# Patient Record
Sex: Female | Born: 1985 | Race: White | Hispanic: No | Smoking: Current every day smoker
Health system: Southern US, Community
[De-identification: ages and names within clinical notes are randomized; demographics above are authoritative.]

## PROBLEM LIST (undated history)

## (undated) DIAGNOSIS — F329 Major depressive disorder, single episode, unspecified: Secondary | ICD-10-CM

## (undated) DIAGNOSIS — F419 Anxiety disorder, unspecified: Secondary | ICD-10-CM

## (undated) DIAGNOSIS — Z87442 Personal history of urinary calculi: Secondary | ICD-10-CM

## (undated) DIAGNOSIS — R87629 Unspecified abnormal cytological findings in specimens from vagina: Secondary | ICD-10-CM

## (undated) DIAGNOSIS — E109 Type 1 diabetes mellitus without complications: Secondary | ICD-10-CM

## (undated) DIAGNOSIS — F32A Depression, unspecified: Secondary | ICD-10-CM

## (undated) DIAGNOSIS — R51 Headache: Secondary | ICD-10-CM

## (undated) HISTORY — PX: CERVICAL BIOPSY  W/ LOOP ELECTRODE EXCISION: SUR135

## (undated) HISTORY — PX: INDUCED ABORTION: SHX677

## (undated) HISTORY — DX: Type 1 diabetes mellitus without complications: E10.9

## (undated) HISTORY — PX: OTHER SURGICAL HISTORY: SHX169

---
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
## 2006-09-26 ENCOUNTER — Ambulatory Visit (HOSPITAL_COMMUNITY): Admission: RE | Admit: 2006-09-26 | Discharge: 2006-09-26 | Payer: Self-pay | Admitting: Obstetrics and Gynecology

## 2006-10-24 ENCOUNTER — Ambulatory Visit (HOSPITAL_COMMUNITY): Admission: RE | Admit: 2006-10-24 | Discharge: 2006-10-24 | Payer: Self-pay | Admitting: Obstetrics and Gynecology

## 2006-11-19 ENCOUNTER — Ambulatory Visit (HOSPITAL_COMMUNITY): Admission: RE | Admit: 2006-11-19 | Discharge: 2006-11-19 | Payer: Self-pay | Admitting: Obstetrics and Gynecology

## 2006-12-17 ENCOUNTER — Ambulatory Visit (HOSPITAL_COMMUNITY): Admission: RE | Admit: 2006-12-17 | Discharge: 2006-12-17 | Payer: Self-pay | Admitting: Obstetrics and Gynecology

## 2007-01-14 ENCOUNTER — Ambulatory Visit (HOSPITAL_COMMUNITY): Admission: RE | Admit: 2007-01-14 | Discharge: 2007-01-14 | Payer: Self-pay | Admitting: Obstetrics and Gynecology

## 2007-02-04 ENCOUNTER — Ambulatory Visit (HOSPITAL_COMMUNITY): Admission: RE | Admit: 2007-02-04 | Discharge: 2007-02-04 | Payer: Self-pay | Admitting: Obstetrics and Gynecology

## 2007-07-16 ENCOUNTER — Emergency Department (HOSPITAL_COMMUNITY): Admission: EM | Admit: 2007-07-16 | Discharge: 2007-07-16 | Payer: Self-pay | Admitting: Emergency Medicine

## 2007-09-27 IMAGING — US US OB FOLLOW-UP
1 series · 14 of 28 positions shown · non-contrast
Comparison: none

OBSTETRICAL ULTRASOUND:
 This ultrasound was performed in The [HOSPITAL], and the [facility] report will be stored to [REDACTED] PACS.

[Series 1: us ob follow-up · 14 of 39 slices shown]
[im 2/39]
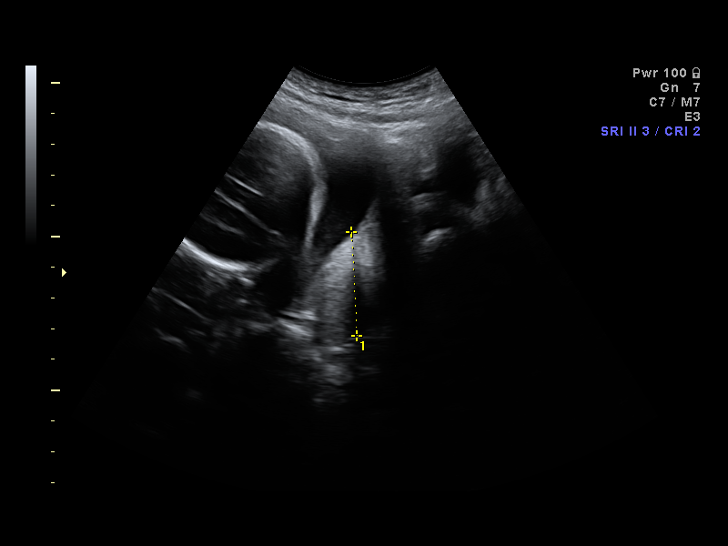
[im 5/39]
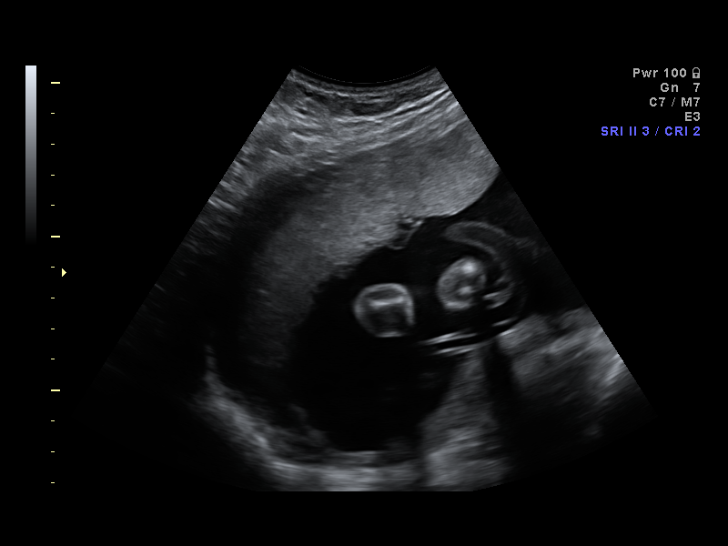
[im 8/39]
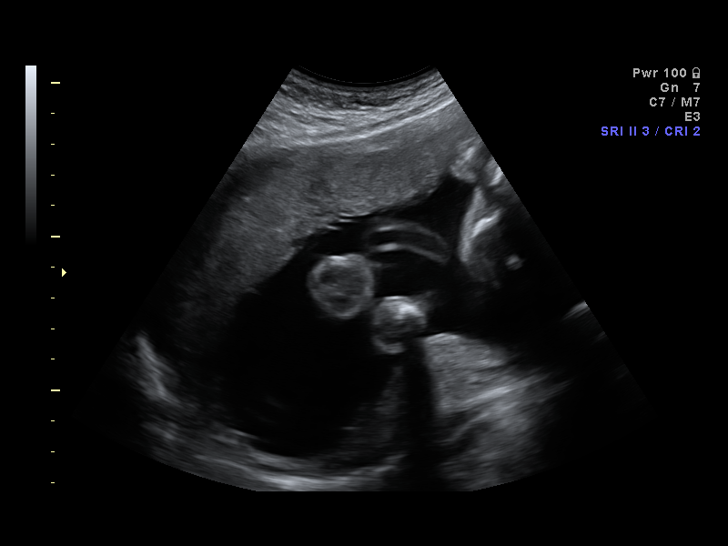
[im 10/39]
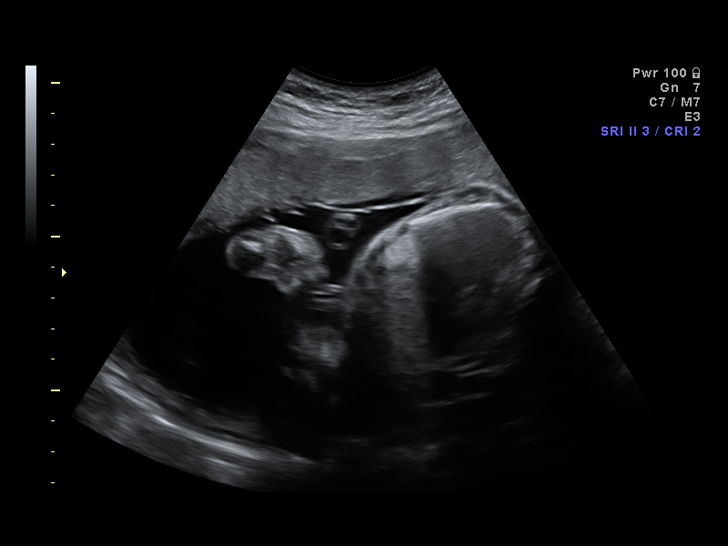
[im 13/39]
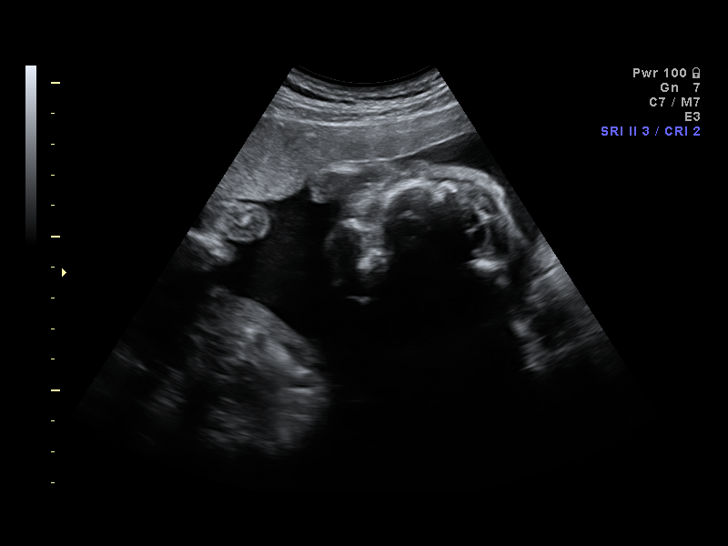
[im 16/39]
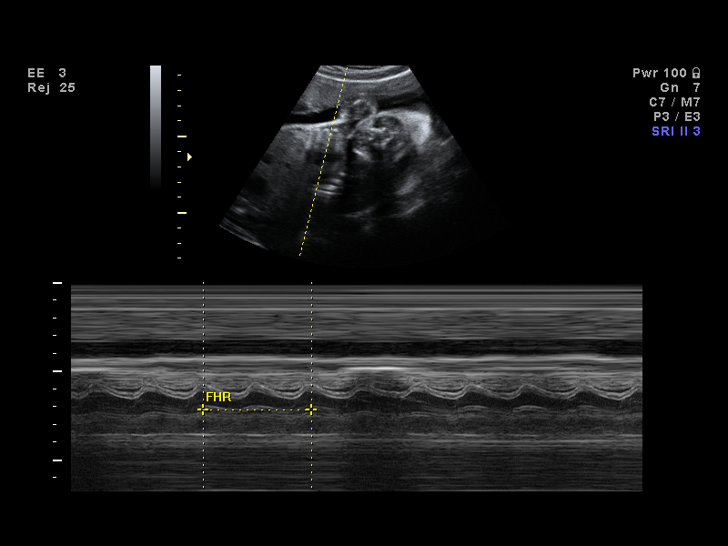
[im 19/39]
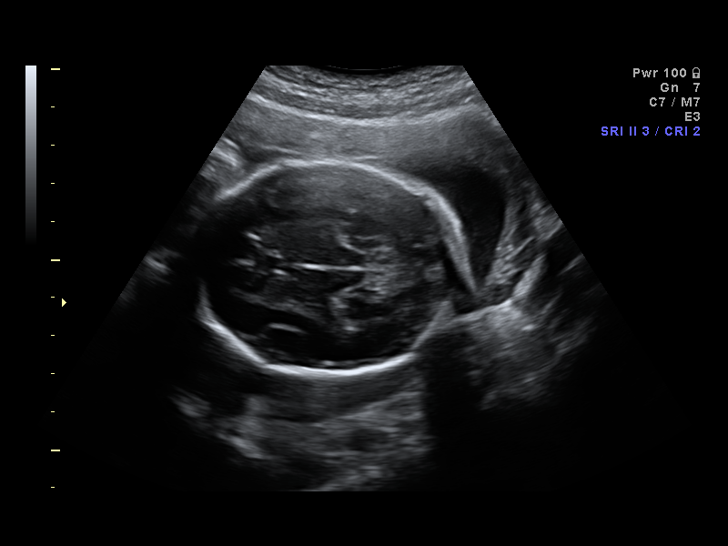
[im 22/39]
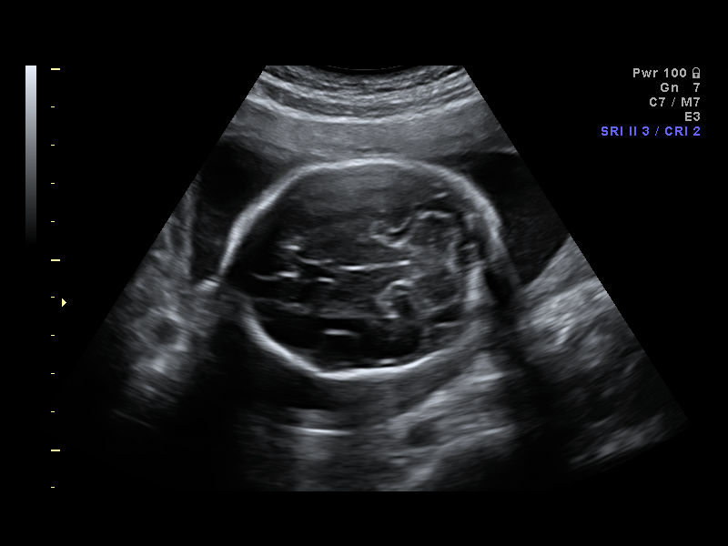
[im 24/39]
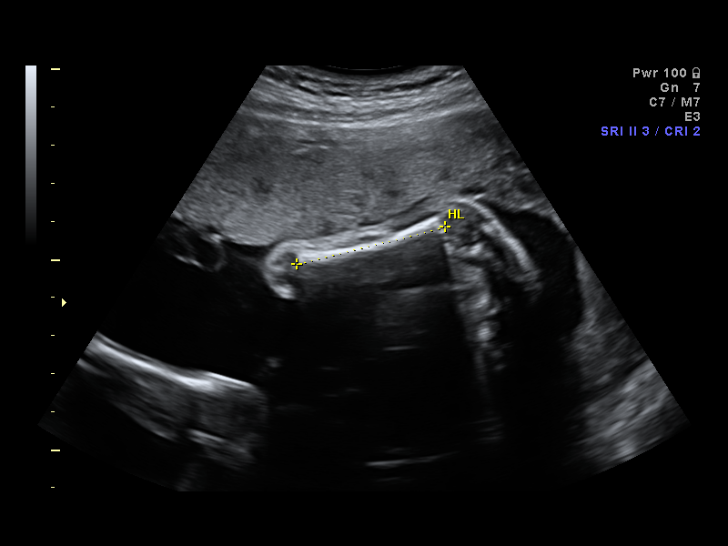
[im 27/39]
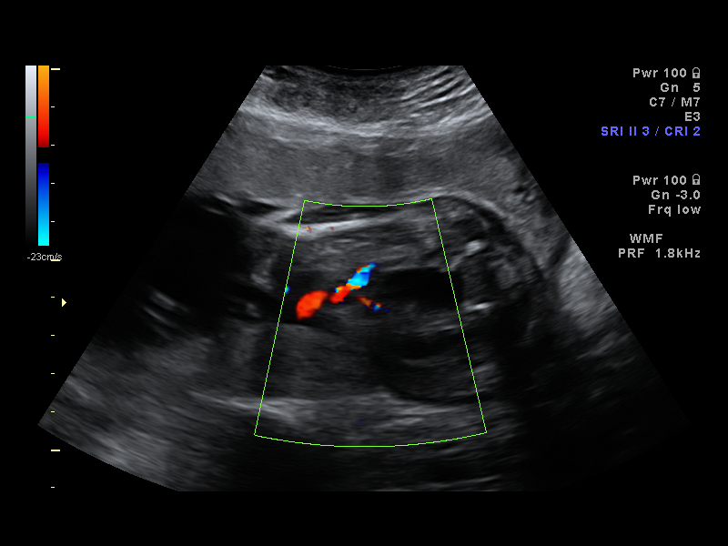
[im 30/39]
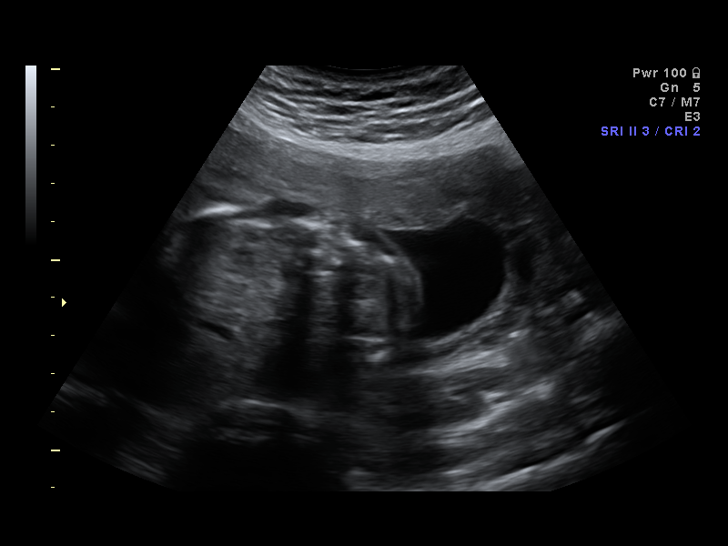
[im 33/39]
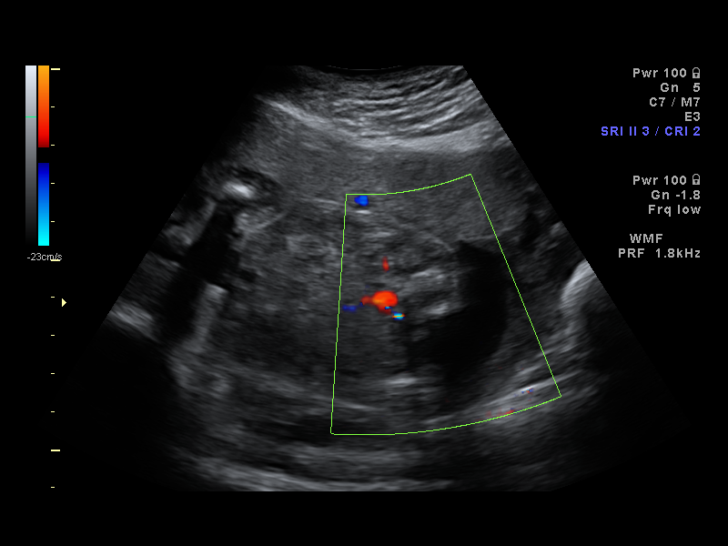
[im 36/39]
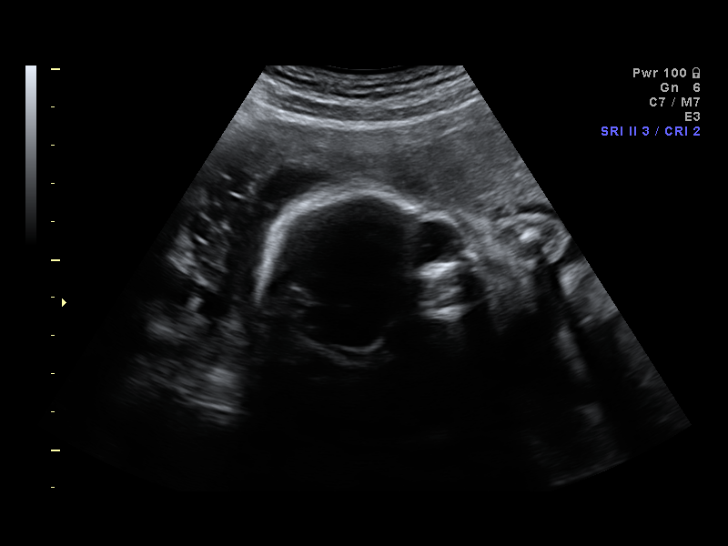
[im 39/39]
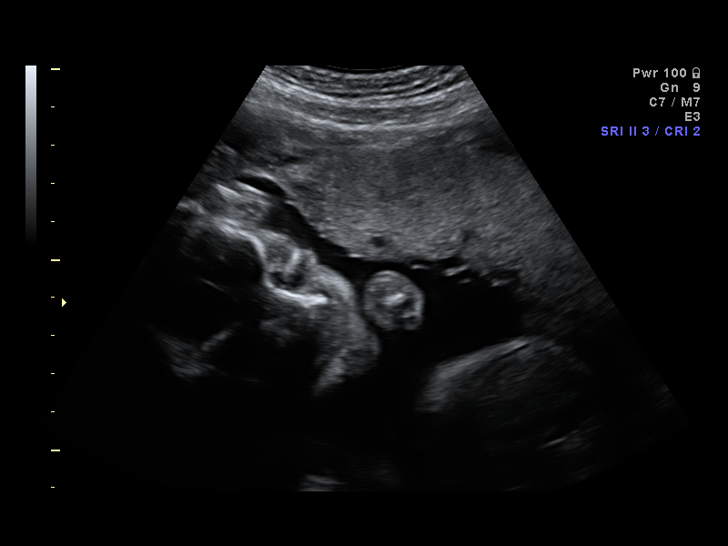

[14 of 28 positions shown; findings below may reference images not displayed]

IMPRESSION: The [facility] report has also been faxed to the ordering physician.

## 2007-12-13 IMAGING — US US OB FOLLOW-UP
1 series · 14 of 16 positions shown · non-contrast
Comparison: none

OBSTETRICAL ULTRASOUND:
 This ultrasound was performed in The [HOSPITAL], and the [facility] report will be stored to [REDACTED] PACS.

[Series 1: us ob follow-up · 16 acquisitions, 14 frames shown]
[im 1/16]
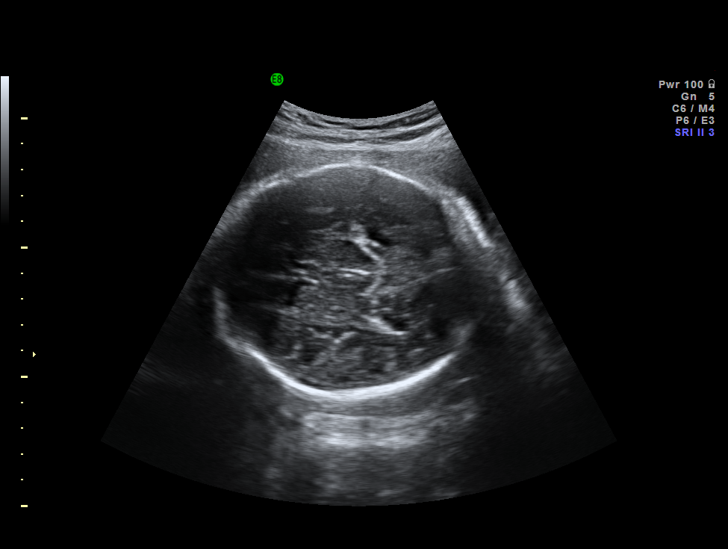
[im 2/16]
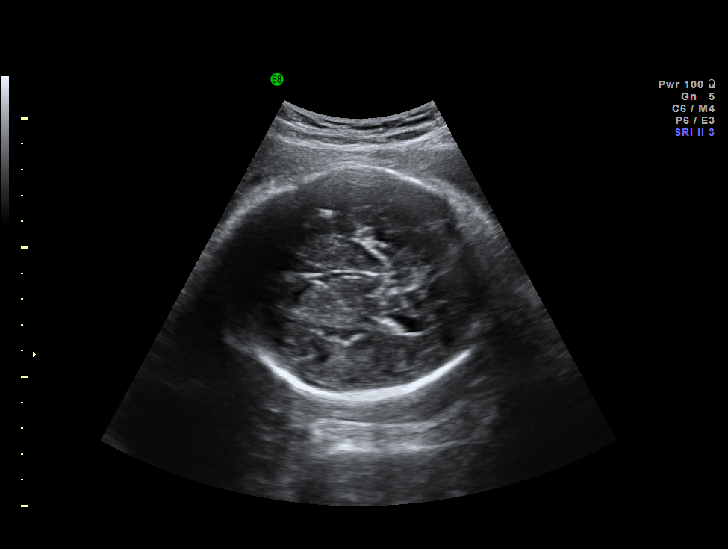
[im 3/16]
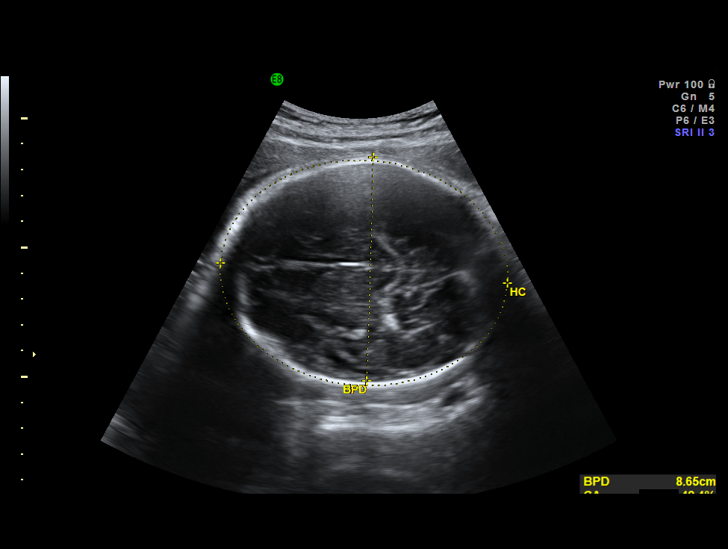
[im 5/16]
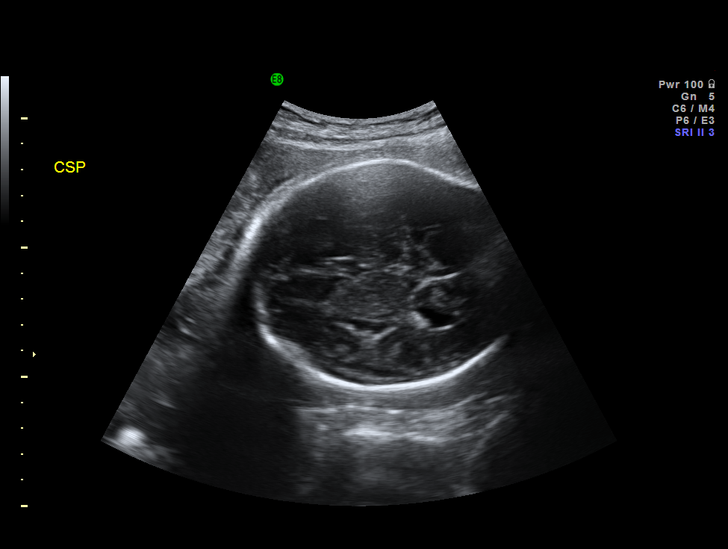
[im 6/16]
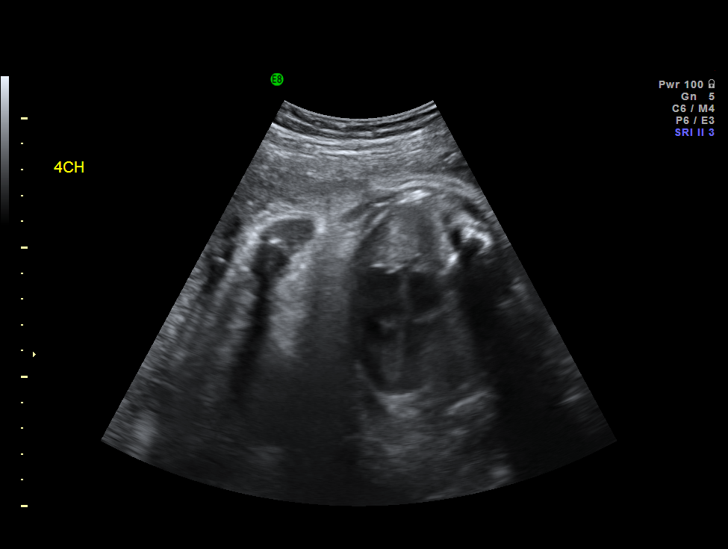
[im 7/16]
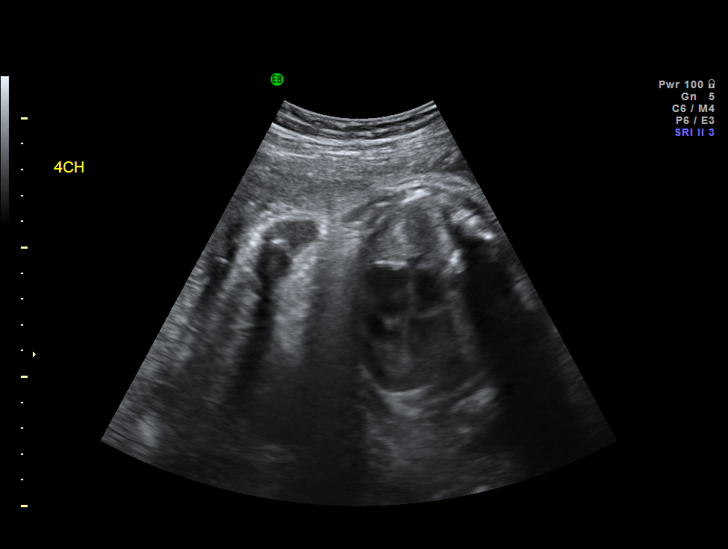
[im 8/16]
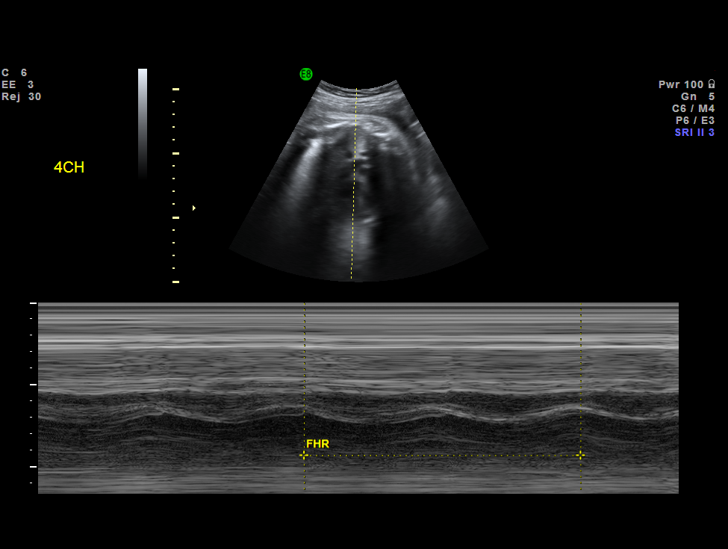
[im 9/16]
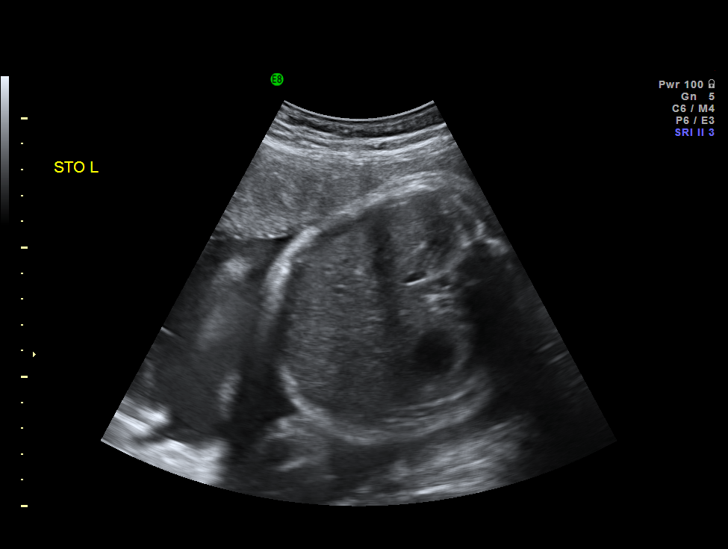
[im 10/16]
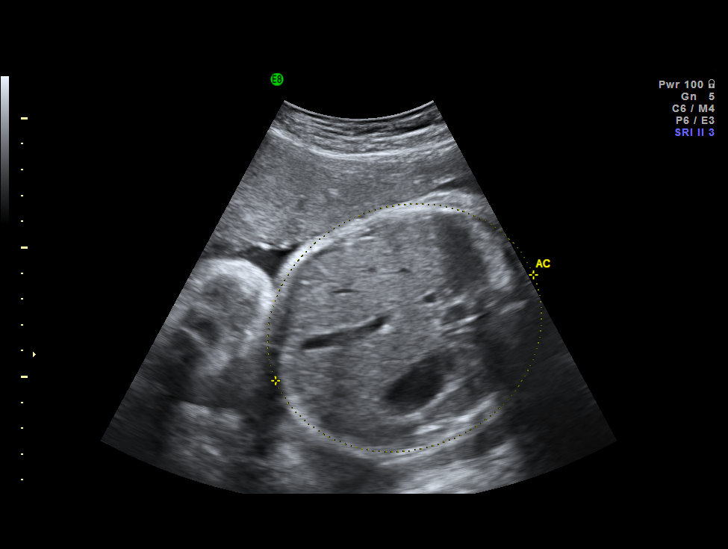
[im 11/16]
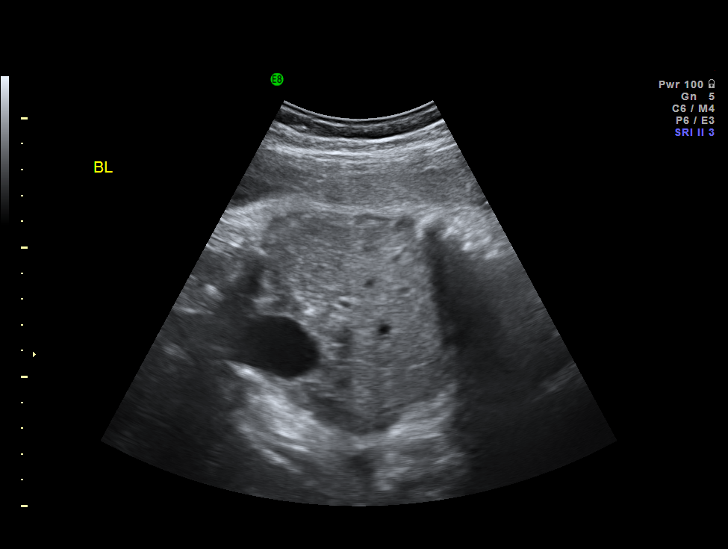
[im 13/16]
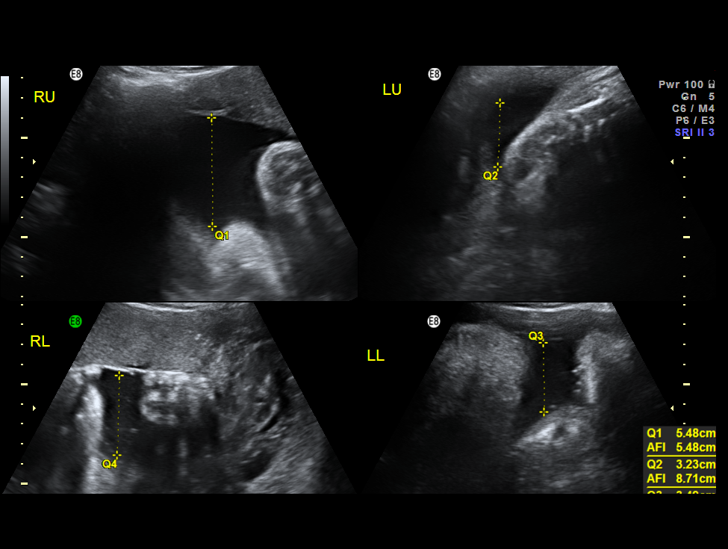
[im 14/16]
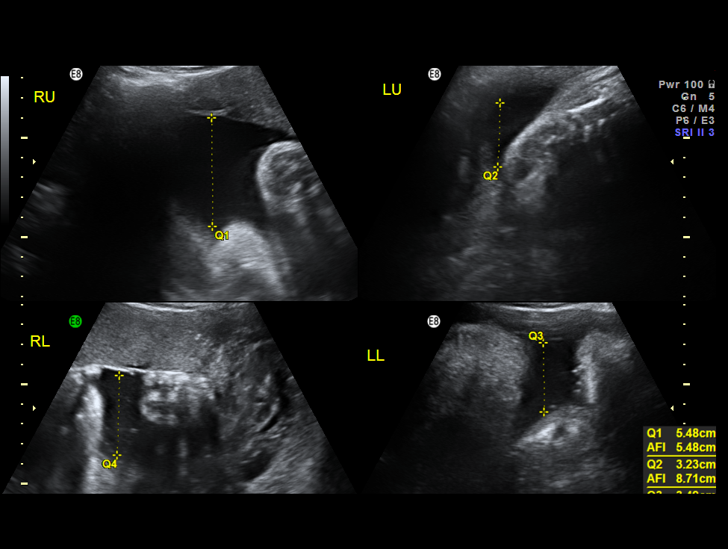
[im 15/16]
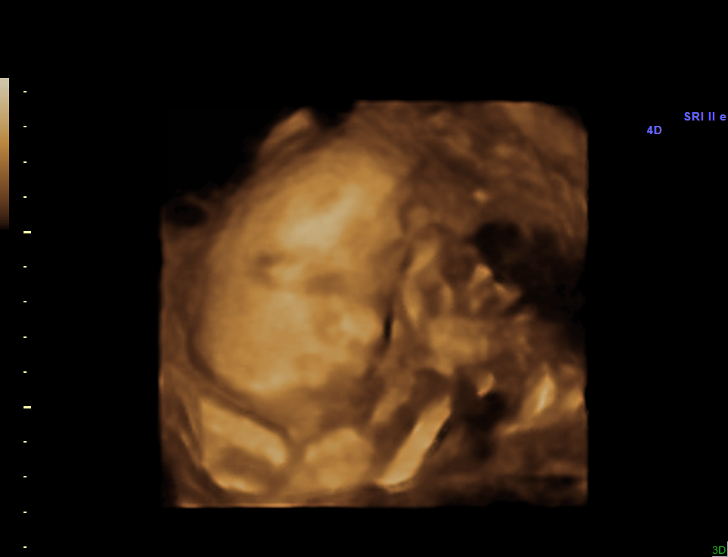
[im 16/16]
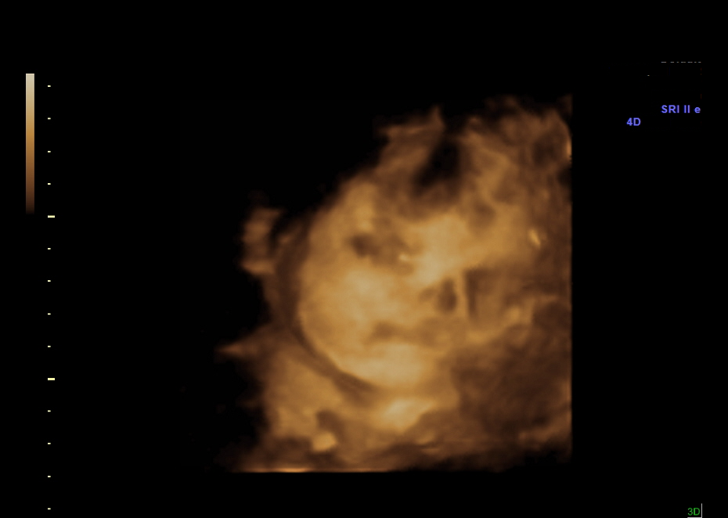

[14 of 16 positions shown; findings below may reference images not displayed]

IMPRESSION: The [facility] report has also been faxed to the ordering physician.

## 2008-08-17 ENCOUNTER — Emergency Department (HOSPITAL_COMMUNITY): Admission: EM | Admit: 2008-08-17 | Discharge: 2008-08-18 | Payer: Self-pay | Admitting: Emergency Medicine

## 2008-10-04 ENCOUNTER — Inpatient Hospital Stay (HOSPITAL_COMMUNITY): Admission: EM | Admit: 2008-10-04 | Discharge: 2008-10-06 | Payer: Self-pay | Admitting: Emergency Medicine

## 2010-01-09 ENCOUNTER — Emergency Department (HOSPITAL_COMMUNITY): Admission: EM | Admit: 2010-01-09 | Discharge: 2010-01-09 | Payer: Self-pay | Admitting: Emergency Medicine

## 2010-08-27 ENCOUNTER — Encounter: Payer: Self-pay | Admitting: Obstetrics and Gynecology

## 2010-10-23 LAB — POCT I-STAT, CHEM 8
Calcium, Ion: 1.18 mmol/L (ref 1.12–1.32)
Glucose, Bld: 235 mg/dL — ABNORMAL HIGH (ref 70–99)
HCT: 42 % (ref 36.0–46.0)
Hemoglobin: 14.3 g/dL (ref 12.0–15.0)
Sodium: 138 mEq/L (ref 135–145)
TCO2: 23 mmol/L (ref 0–100)

## 2010-10-23 LAB — CBC
Hemoglobin: 14.2 g/dL (ref 12.0–15.0)
WBC: 7.2 10*3/uL (ref 4.0–10.5)

## 2010-10-23 LAB — URINE MICROSCOPIC-ADD ON

## 2010-10-23 LAB — DIFFERENTIAL
Basophils Absolute: 0 10*3/uL (ref 0.0–0.1)
Basophils Relative: 0 % (ref 0–1)
Eosinophils Absolute: 0.1 10*3/uL (ref 0.0–0.7)
Eosinophils Relative: 1 % (ref 0–5)
Monocytes Absolute: 0.4 10*3/uL (ref 0.1–1.0)
Monocytes Relative: 5 % (ref 3–12)
Neutro Abs: 4.6 10*3/uL (ref 1.7–7.7)
Neutrophils Relative %: 64 % (ref 43–77)

## 2010-10-23 LAB — URINALYSIS, ROUTINE W REFLEX MICROSCOPIC
Hgb urine dipstick: NEGATIVE
Ketones, ur: NEGATIVE mg/dL
Leukocytes, UA: NEGATIVE
Nitrite: NEGATIVE
Protein, ur: NEGATIVE mg/dL
Specific Gravity, Urine: 1.029 (ref 1.005–1.030)

## 2010-11-10 ENCOUNTER — Emergency Department (HOSPITAL_COMMUNITY)
Admission: EM | Admit: 2010-11-10 | Discharge: 2010-11-10 | Disposition: A | Discharge status: Home / Self Care | Payer: BC Managed Care – HMO | Attending: Emergency Medicine | Admitting: Emergency Medicine

## 2010-11-10 DIAGNOSIS — R5381 Other malaise: Secondary | ICD-10-CM | POA: Insufficient documentation

## 2010-11-10 DIAGNOSIS — R5383 Other fatigue: Secondary | ICD-10-CM | POA: Insufficient documentation

## 2010-11-10 DIAGNOSIS — R631 Polydipsia: Secondary | ICD-10-CM | POA: Insufficient documentation

## 2010-11-10 DIAGNOSIS — E119 Type 2 diabetes mellitus without complications: Secondary | ICD-10-CM | POA: Insufficient documentation

## 2010-11-10 LAB — POCT I-STAT, CHEM 8
BUN: 14 mg/dL (ref 6–23)
Calcium, Ion: 1.08 mmol/L — ABNORMAL LOW (ref 1.12–1.32)
Chloride: 105 mEq/L (ref 96–112)
Creatinine, Ser: 0.5 mg/dL (ref 0.4–1.2)
HCT: 39 % (ref 36.0–46.0)
Hemoglobin: 13.3 g/dL (ref 12.0–15.0)
Sodium: 138 mEq/L (ref 135–145)

## 2010-11-10 LAB — URINALYSIS, ROUTINE W REFLEX MICROSCOPIC
Glucose, UA: >1000 mg/dL — AB
Hgb urine dipstick: NEGATIVE
Protein, ur: NEGATIVE mg/dL

## 2010-11-10 LAB — GLUCOSE, CAPILLARY

## 2010-11-10 LAB — URINE MICROSCOPIC-ADD ON

## 2010-11-16 LAB — GLUCOSE, CAPILLARY
Glucose-Capillary: 151 mg/dL — ABNORMAL HIGH (ref 70–99)
Glucose-Capillary: 169 mg/dL — ABNORMAL HIGH (ref 70–99)
Glucose-Capillary: 174 mg/dL — ABNORMAL HIGH (ref 70–99)
Glucose-Capillary: 223 mg/dL — ABNORMAL HIGH (ref 70–99)
Glucose-Capillary: 225 mg/dL — ABNORMAL HIGH (ref 70–99)
Glucose-Capillary: 229 mg/dL — ABNORMAL HIGH (ref 70–99)
Glucose-Capillary: 241 mg/dL — ABNORMAL HIGH (ref 70–99)
Glucose-Capillary: 259 mg/dL — ABNORMAL HIGH (ref 70–99)
Glucose-Capillary: 267 mg/dL — ABNORMAL HIGH (ref 70–99)

## 2010-11-16 LAB — BASIC METABOLIC PANEL
BUN: 4 mg/dL — ABNORMAL LOW (ref 6–23)
CO2: 25 mEq/L (ref 19–32)
Calcium: 7.9 mg/dL — ABNORMAL LOW (ref 8.4–10.5)
Chloride: 111 mEq/L (ref 96–112)
Creatinine, Ser: 0.49 mg/dL (ref 0.4–1.2)
GFR calc Af Amer: >60 mL/min (ref >60)
GFR calc non Af Amer: >60 mL/min (ref >60)
Glucose, Bld: 245 mg/dL — ABNORMAL HIGH (ref 70–99)
Potassium: 3.8 mEq/L (ref 3.5–5.1)
Sodium: 139 mEq/L (ref 135–145)

## 2010-11-16 LAB — DIFFERENTIAL
Basophils Absolute: 0 10*3/uL (ref 0.0–0.1)
Basophils Relative: 0 % (ref 0–1)
Eosinophils Absolute: 0.1 10*3/uL (ref 0.0–0.7)
Neutro Abs: 4.3 10*3/uL (ref 1.7–7.7)
Neutrophils Relative %: 63 % (ref 43–77)

## 2010-11-16 LAB — CBC
MCHC: 35.3 g/dL (ref 30.0–36.0)
MCV: 88.6 fL (ref 78.0–100.0)
Platelets: 219 10*3/uL (ref 150–400)

## 2010-11-16 LAB — HEMOGLOBIN A1C
Hgb A1c MFr Bld: 11.2 % — ABNORMAL HIGH (ref 4.6–6.1)
Mean Plasma Glucose: 275 mg/dL

## 2010-11-20 LAB — URINALYSIS, ROUTINE W REFLEX MICROSCOPIC
Nitrite: NEGATIVE
Specific Gravity, Urine: 1.017 (ref 1.005–1.030)
Urobilinogen, UA: 0.2 mg/dL (ref 0.0–1.0)

## 2010-11-20 LAB — DIFFERENTIAL
Basophils Absolute: 0.3 10*3/uL — ABNORMAL HIGH (ref 0.0–0.1)
Basophils Relative: 2 % — ABNORMAL HIGH (ref 0–1)
Eosinophils Relative: 0 % (ref 0–5)
Monocytes Absolute: 0.8 10*3/uL (ref 0.1–1.0)
Neutro Abs: 12.6 10*3/uL — ABNORMAL HIGH (ref 1.7–7.7)

## 2010-11-20 LAB — CBC
Hemoglobin: 12.5 g/dL (ref 12.0–15.0)
MCHC: 33.7 g/dL (ref 30.0–36.0)
Platelets: 294 10*3/uL (ref 150–400)
RDW: 12 % (ref 11.5–15.5)

## 2010-11-20 LAB — BASIC METABOLIC PANEL
BUN: 8 mg/dL (ref 6–23)
CO2: 21 mEq/L (ref 19–32)
Calcium: 9.3 mg/dL (ref 8.4–10.5)
Glucose, Bld: 197 mg/dL — ABNORMAL HIGH (ref 70–99)
Sodium: 138 mEq/L (ref 135–145)

## 2010-11-20 LAB — HEPATIC FUNCTION PANEL
AST: 10 U/L (ref 0–37)
Bilirubin, Direct: 0.2 mg/dL (ref 0.0–0.3)
Indirect Bilirubin: 0.9 mg/dL (ref 0.3–0.9)

## 2010-11-20 LAB — URINE MICROSCOPIC-ADD ON

## 2010-11-21 LAB — DIFFERENTIAL
Basophils Absolute: 0.1 10*3/uL (ref 0.0–0.1)
Lymphocytes Relative: 20 % (ref 12–46)
Monocytes Absolute: 0.5 10*3/uL (ref 0.1–1.0)
Monocytes Relative: 6 % (ref 3–12)
Neutro Abs: 5.6 10*3/uL (ref 1.7–7.7)

## 2010-11-21 LAB — COMPREHENSIVE METABOLIC PANEL
Albumin: 3 g/dL — ABNORMAL LOW (ref 3.5–5.2)
BUN: 7 mg/dL (ref 6–23)
Creatinine, Ser: 0.56 mg/dL (ref 0.4–1.2)
Total Bilirubin: 0.5 mg/dL (ref 0.3–1.2)
Total Protein: 6.4 g/dL (ref 6.0–8.3)

## 2010-11-21 LAB — URINE CULTURE: Colony Count: >=100000

## 2010-11-21 LAB — POCT I-STAT 3, VENOUS BLOOD GAS (G3P V)
Acid-Base Excess: 2 mmol/L (ref 0.0–2.0)
Bicarbonate: 26.7 mEq/L — ABNORMAL HIGH (ref 20.0–24.0)
pH, Ven: 7.438 — ABNORMAL HIGH (ref 7.250–7.300)
pO2, Ven: 28 mmHg — CL (ref 30.0–45.0)

## 2010-11-21 LAB — URINALYSIS, ROUTINE W REFLEX MICROSCOPIC
Hgb urine dipstick: NEGATIVE
Nitrite: NEGATIVE
Protein, ur: NEGATIVE mg/dL
Specific Gravity, Urine: 1.021 (ref 1.005–1.030)
Urobilinogen, UA: 1 mg/dL (ref 0.0–1.0)

## 2010-11-21 LAB — CBC
HCT: 32 % — ABNORMAL LOW (ref 36.0–46.0)
MCV: 88.9 fL (ref 78.0–100.0)
Platelets: 226 10*3/uL (ref 150–400)
RDW: 14.8 % (ref 11.5–15.5)

## 2010-11-21 LAB — URINE MICROSCOPIC-ADD ON

## 2010-12-19 NOTE — Discharge Summary (Signed)
Traci Ellison, Traci Ellison             ACCOUNT NO.:  [id number]   MEDICAL RECORD NO.:  [id number]          PATIENT TYPE:  INP   LOCATION:  5120                         FACILITY:  [facility]   PHYSICIAN:  Michelene Gardener, MD    DATE OF BIRTH:  [birth year]   DATE OF ADMISSION:  10/03/2008  DATE OF DISCHARGE:  10/06/2008                               DISCHARGE SUMMARY   DISCHARGE DIAGNOSES:  1. Bilateral pyelonephritis.  2. Right mid kidney abscess versus cyst.  3. Hyperglycemia secondary to type 1 diabetes mellitus.  4. Low back pain secondary to bilateral pyelonephritis right mid      kidney abscess versus cyst.  5. Hypotension that is resolved.   DISCHARGE MEDICATIONS:  1. Nitrofurantoin 100 mg p.o. once daily x7 days.  2. The patient will continue her insulin pump and her NovoLog as      prescribed by her endocrinologist.   CONSULTATIONS:  None.   PROCEDURES:  None.   RADIOLOGY STUDIES:  1. Chest x-ray on October 03, 2008, showed no acute disease.  2. CT scan of the abdomen on October 04, 2008, showed bilateral nephritis      worse in the right side and a small abscess in the right kidney.      CT scan of the pelvis without contrast showed negative pelvic CT.   FOLLOWUP:  1. Private endocrinologist in 1 week.  2. Primary physician in 1 week.   COURSE OF HOSPITALIZATION:  1. Bilateral pyelonephritis.  This patient had presented to the      hospital with symptoms and signs consistent with pyelonephritis.  A      CT scan of the abdomen and pelvis was done, and it showed findings      consistent with pyelonephritis and a small abscess.  The patient      was admitted to the hospital for antibiotics.  She was started on      IV ciprofloxacin.  Case was discussed with Interventional Radiology      who think the abscess is very small and does not need any kind of      intervention at this point.  The patient did very well during the      hospitalization.  Initially, she was started on IV  Cipro.  Urine      cultures were sent that came positive for E. coli which is      resistant to ciprofloxacin use, but sensitive to nitrofurantoin.      The patient was given a prescription for nitrofurantoin for 7 days.      She was advised to follow with primary doctor.  The patient might      need repeat CAT scan if her symptoms worsened or if she developed      high fever to follow her abscess that was explained to her and she      was advised to follow with her primary doctor in that regard.  At      the time of discharge, she is very stable, does not have fever.      Vitals has been stable.  2. Right mid kidney abscess versus cyst management was as #1.  3. Hyperglycemia secondary to diabetes mellitus.  The patient has been      noncompliant with her insulin pump because of financial problems.      She was put on Lantus and NovoLog during her current      hospitalization.  I discussed her diabetes control with her, and      she stated that she will go back to her insulin pump since the      issues with that has been resolved.  She will monitor her sugars at      home and will check with her endocrinologist this week for any      further adjustment.  4. Hypotension that has resolved with IV fluids.   Otherwise, other medical conditions remained stable in the hospital.  At  the time of the discharge, the patient was asymptomatic and was back to  her baseline.  She was given antibiotics for 7 days, and she will follow  with her primary doctor and with her private endocrinologist.   Total assessment time is 40 minutes.      Michelene Gardener, MD  Electronically Signed     NAE/MEDQ  D:  10/06/2008  T:  10/06/2008  Job:  [id number]

## 2010-12-19 NOTE — H&P (Signed)
Traci Ellison, Traci Ellison             ACCOUNT NO.:  [id number]   MEDICAL RECORD NO.:  [id number]          PATIENT TYPE:  INP   LOCATION:  5120                         FACILITY:  [facility]   PHYSICIAN:  Della Goo, M.D. DATE OF BIRTH:  11-01-1985   DATE OF ADMISSION:  10/03/2008  DATE OF DISCHARGE:                              HISTORY & PHYSICAL   PRIMARY CARE PHYSICIAN:  [facility].   CHIEF COMPLAINT:  Right-sided back pain.   HISTORY OF PRESENT ILLNESS:  This is a 25 year old female who presents  to the emergency department with complaints of severe right-sided back  pain along with nausea and vomiting for the past 4 to 5 days.  The  patient reports she began to have fevers  the next day after the  symptoms started.  She denies dysuria but does report was going to the  bathroom frequently.  She also reports her blood sugars have been  higher.  The patient is a type 1 diabetic and has an insulin pump.   In the emergency department the patient was evaluated by the EDP and a  renal ultrasound study was performed which did not reveal any remarkable  results.  However, was negative for a kidney stone and a CT scan of the  abdomen and pelvis was also performed which did reveal changes  consistent with bilateral pyelonephritis and a right mid kidney abscess.  The patient was started on IV antibiotic therapy of Cipro and referred  for admission and given IV fluids as well.   PAST MEDICAL HISTORY:  Significant for:  1. Previous pyelonephritis.  2. Kidney stone.  3. Type 1 diabetes mellitus since age 62 and status post insulin      pump.   MEDICATIONS:  Humalog insulin for her insulin pump and she doses the  insulin administration of according to her carb count.   ALLERGIES:  No known drug allergies.   SOCIAL HISTORY:  The patient is a smoker.  She states that she smokes  3/4 pack of cigarettes daily and has smoked 4 or 5 years.  She denies  any alcohol  usage.  She is married with one child.   FAMILY HISTORY:  Family history is positive for diabetes mellitus in her  mother with type 1 diabetes as well.  Also coronary artery disease in  her mother and cancer in her paternal grandfather.   REVIEW OF SYSTEMS:  Review of systems pertinents are mentioned above.  The patient denies having any diarrhea, chest pain, shortness of breath,  syncope, lightheadedness, dizziness, seizures, bowel changes or symptoms  of congestion.   PHYSICAL EXAMINATION:  GENERAL:  This is a 25 year old thin, well-  developed female in discomfort but no acute distress.  VITAL SIGNS:  Temperature 98.1, blood pressure 107/66 initially, heart  rate 99, respirations 18, O2 sats 100%.  Blood pressure did decrease to  94/62.  HEENT:  Normocephalic, atraumatic.  Pupils equally round and reactive to  light.  Extraocular movements are intact. Funduscopic benign.  Nares are  patent bilaterally.  Oropharynx is clear.  NECK:  Neck is supple, full range  of motion.  No thyromegaly,  adenopathy, jugular venous distention.  CARDIOVASCULAR:  Regular rate  and rhythm.  No murmurs, gallops or rubs.  LUNGS:  Clear to auscultation bilaterally.  ABDOMEN:  Positive bowel sounds.  Soft, nontender, nondistended.  EXTREMITIES:  Without cyanosis, clubbing or edema.  NEUROLOGIC:  The patient alert and oriented x3.  There are no focal  deficits.   LABORATORY STUDIES:  White blood cell count 7.7, hemoglobin 11.3,  hematocrit 32, platelets 126,000, MCV 88.9, neutrophils 72%, lymphocytes  20%.  Urinalysis reveals urine glucose of greater than 1000, urine  ketones 15 and small leukocyte esterase present.  Microscopic urine  reveals a few urine epithelials, urine white blood cells 3-6, and  bacteria are rare.  Sodium 130, potassium 3.7, chloride 97, carbon  dioxide 26, BUN 7, creatinine 0.56, and glucose 394, lipase 36.  Renal  ultrasound and CT of the abdomen and pelvis results are mentioned  in the  HPI.   ASSESSMENT:  A 25 year old female being admitted with:  1. Bilateral pyelonephritis.  2. Right mid kidney abscess versus cyst.  3. Hyperglycemia with type 1 diabetes mellitus.  4. Low back pain secondary to #1 and #2.  5. Hypotension.   PLAN:  The patient will be admitted and placed on IV antibiotic therapy  of ciprofloxacin.  Urine cultures have been sent and antibiotic therapy  will be further adjusted pending results of the culture or if her  clinical condition changes..  The patient's IV fluids have been ordered  for fluid resuscitation and rehydration therapy.  The patient's  electrolytes will be corrected as needed.  The patient will be placed on  sliding scale insulin coverage for now.  The patient's does not have her  insulin pump at this time.  The patient will be placed on DVT and GI  prophylaxis and pain control therapy has been ordered as needed.      Della Goo, M.D.  Electronically Signed     HJ/MEDQ  D:  10/04/2008  T:  10/05/2008  Job:  [id number]   cc:   Ernestina Penna, M.D.

## 2011-01-20 ENCOUNTER — Emergency Department (HOSPITAL_COMMUNITY)
Admission: EM | Admit: 2011-01-20 | Discharge: 2011-01-20 | Disposition: A | Discharge status: Home / Self Care | Payer: BC Managed Care – HMO | Attending: Emergency Medicine | Admitting: Emergency Medicine

## 2011-01-20 DIAGNOSIS — E119 Type 2 diabetes mellitus without complications: Secondary | ICD-10-CM | POA: Insufficient documentation

## 2011-01-20 DIAGNOSIS — R55 Syncope and collapse: Secondary | ICD-10-CM | POA: Insufficient documentation

## 2011-01-20 DIAGNOSIS — R5381 Other malaise: Secondary | ICD-10-CM | POA: Insufficient documentation

## 2011-01-20 LAB — POCT I-STAT, CHEM 8
Chloride: 109 mEq/L (ref 96–112)
Glucose, Bld: 58 mg/dL — ABNORMAL LOW (ref 70–99)
HCT: 44 % (ref 36.0–46.0)
Hemoglobin: 15 g/dL (ref 12.0–15.0)
Potassium: 3.1 mEq/L — ABNORMAL LOW (ref 3.5–5.1)
Sodium: 143 mEq/L (ref 135–145)

## 2011-01-20 LAB — ETHANOL: Alcohol, Ethyl (B): <11 mg/dL (ref 0–11)

## 2011-01-20 LAB — URINALYSIS, ROUTINE W REFLEX MICROSCOPIC
Leukocytes, UA: NEGATIVE
Nitrite: NEGATIVE
Specific Gravity, Urine: 1.011 (ref 1.005–1.030)
Urobilinogen, UA: 0.2 mg/dL (ref 0.0–1.0)
pH: 7.5 (ref 5.0–8.0)

## 2011-01-20 LAB — RAPID URINE DRUG SCREEN, HOSP PERFORMED: Opiates: NOT DETECTED

## 2011-01-20 LAB — PREGNANCY, URINE: Preg Test, Ur: NEGATIVE

## 2011-01-22 LAB — GLUCOSE, CAPILLARY
Glucose-Capillary: 109 mg/dL — ABNORMAL HIGH (ref 70–99)
Glucose-Capillary: 67 mg/dL — ABNORMAL LOW (ref 70–99)

## 2011-01-29 ENCOUNTER — Emergency Department (HOSPITAL_COMMUNITY)
Admission: EM | Admit: 2011-01-29 | Discharge: 2011-01-30 | Disposition: A | Discharge status: Other Acute Inpatient Hospital | Payer: BC Managed Care – HMO | Attending: Emergency Medicine | Admitting: Emergency Medicine

## 2011-01-29 DIAGNOSIS — T50904A Poisoning by unspecified drugs, medicaments and biological substances, undetermined, initial encounter: Secondary | ICD-10-CM | POA: Insufficient documentation

## 2011-01-29 DIAGNOSIS — F329 Major depressive disorder, single episode, unspecified: Secondary | ICD-10-CM | POA: Insufficient documentation

## 2011-01-29 DIAGNOSIS — T50901A Poisoning by unspecified drugs, medicaments and biological substances, accidental (unintentional), initial encounter: Secondary | ICD-10-CM | POA: Insufficient documentation

## 2011-01-29 DIAGNOSIS — Z794 Long term (current) use of insulin: Secondary | ICD-10-CM | POA: Insufficient documentation

## 2011-01-29 DIAGNOSIS — E119 Type 2 diabetes mellitus without complications: Secondary | ICD-10-CM | POA: Insufficient documentation

## 2011-01-29 DIAGNOSIS — F3289 Other specified depressive episodes: Secondary | ICD-10-CM | POA: Insufficient documentation

## 2011-01-29 LAB — COMPREHENSIVE METABOLIC PANEL
AST: 10 U/L (ref 0–37)
Alkaline Phosphatase: 49 U/L (ref 39–117)
BUN: 7 mg/dL (ref 6–23)
CO2: 19 mEq/L (ref 19–32)
Chloride: 103 mEq/L (ref 96–112)
Creatinine, Ser: 0.57 mg/dL (ref 0.50–1.10)
GFR calc non Af Amer: >60 mL/min (ref >60)
Potassium: 3.3 mEq/L — ABNORMAL LOW (ref 3.5–5.1)
Total Bilirubin: 0.4 mg/dL (ref 0.3–1.2)

## 2011-01-29 LAB — DIFFERENTIAL
Basophils Absolute: 0 10*3/uL (ref 0.0–0.1)
Basophils Relative: 0 % (ref 0–1)
Eosinophils Absolute: 0.1 10*3/uL (ref 0.0–0.7)
Eosinophils Relative: 1 % (ref 0–5)
Lymphocytes Relative: 25 % (ref 12–46)
Monocytes Absolute: 0.3 10*3/uL (ref 0.1–1.0)

## 2011-01-29 LAB — RAPID URINE DRUG SCREEN, HOSP PERFORMED
Amphetamines: NOT DETECTED
Barbiturates: NOT DETECTED
Benzodiazepines: NOT DETECTED
Cocaine: NOT DETECTED
Tetrahydrocannabinol: NOT DETECTED

## 2011-01-29 LAB — GLUCOSE, CAPILLARY: Glucose-Capillary: 156 mg/dL — ABNORMAL HIGH (ref 70–99)

## 2011-01-29 LAB — CBC
MCH: 31.4 pg (ref 26.0–34.0)
MCV: 90 fL (ref 78.0–100.0)

## 2011-01-29 LAB — ACETAMINOPHEN LEVEL: Acetaminophen (Tylenol), Serum: <15 ug/mL (ref 10–30)

## 2011-01-29 LAB — SALICYLATE LEVEL: Salicylate Lvl: <2 mg/dL — ABNORMAL LOW (ref 2.8–20.0)

## 2011-01-30 ENCOUNTER — Inpatient Hospital Stay (HOSPITAL_COMMUNITY)
Admission: AD | Admit: 2011-01-30 | Discharge: 2011-02-02 | DRG: 430 | Disposition: A | Discharge status: Home / Self Care | Payer: BC Managed Care – HMO | Source: Ambulatory Visit | Attending: Psychiatry | Admitting: Psychiatry

## 2011-01-30 DIAGNOSIS — F339 Major depressive disorder, recurrent, unspecified: Secondary | ICD-10-CM

## 2011-01-30 DIAGNOSIS — T43502A Poisoning by unspecified antipsychotics and neuroleptics, intentional self-harm, initial encounter: Secondary | ICD-10-CM

## 2011-01-30 DIAGNOSIS — T43224A Poisoning by selective serotonin reuptake inhibitors, undetermined, initial encounter: Secondary | ICD-10-CM

## 2011-01-30 DIAGNOSIS — F17200 Nicotine dependence, unspecified, uncomplicated: Secondary | ICD-10-CM

## 2011-01-30 DIAGNOSIS — T438X2A Poisoning by other psychotropic drugs, intentional self-harm, initial encounter: Secondary | ICD-10-CM

## 2011-01-30 DIAGNOSIS — E119 Type 2 diabetes mellitus without complications: Secondary | ICD-10-CM

## 2011-01-30 DIAGNOSIS — Z794 Long term (current) use of insulin: Secondary | ICD-10-CM

## 2011-01-30 DIAGNOSIS — F332 Major depressive disorder, recurrent severe without psychotic features: Principal | ICD-10-CM

## 2011-01-30 LAB — GLUCOSE, CAPILLARY
Glucose-Capillary: 176 mg/dL — ABNORMAL HIGH (ref 70–99)
Glucose-Capillary: 220 mg/dL — ABNORMAL HIGH (ref 70–99)

## 2011-01-30 LAB — TSH: TSH: 0.656 u[IU]/mL (ref 0.350–4.500)

## 2011-01-30 LAB — VALPROIC ACID LEVEL: Valproic Acid Lvl: <10 ug/mL — ABNORMAL LOW (ref 50.0–100.0)

## 2011-01-30 LAB — T4, FREE: Free T4: 1.1 ng/dL (ref 0.80–1.80)

## 2011-01-31 LAB — HEMOGLOBIN A1C
Hgb A1c MFr Bld: 9.2 % — ABNORMAL HIGH (ref <5.7)
Mean Plasma Glucose: 217 mg/dL — ABNORMAL HIGH (ref <117)

## 2011-01-31 LAB — GLUCOSE, CAPILLARY: Glucose-Capillary: 108 mg/dL — ABNORMAL HIGH (ref 70–99)
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
## 2011-02-01 LAB — COMPREHENSIVE METABOLIC PANEL
ALT: 9 U/L (ref 0–35)
AST: 9 U/L (ref 0–37)
CO2: 27 mEq/L (ref 19–32)
Chloride: 100 mEq/L (ref 96–112)
Creatinine, Ser: 0.61 mg/dL (ref 0.50–1.10)
GFR calc non Af Amer: >60 mL/min (ref >60)
Total Bilirubin: 0.2 mg/dL — ABNORMAL LOW (ref 0.3–1.2)

## 2011-02-01 LAB — DIFFERENTIAL
Eosinophils Absolute: 0 10*3/uL (ref 0.0–0.7)
Lymphocytes Relative: 26 % (ref 12–46)
Lymphs Abs: 2 10*3/uL (ref 0.7–4.0)
Neutro Abs: 5.2 10*3/uL (ref 1.7–7.7)
Neutrophils Relative %: 66 % (ref 43–77)

## 2011-02-01 LAB — CBC
Hemoglobin: 13.3 g/dL (ref 12.0–15.0)
MCV: 90.5 fL (ref 78.0–100.0)
Platelets: 232 10*3/uL (ref 150–400)
RBC: 4.19 MIL/uL (ref 3.87–5.11)
WBC: 7.8 10*3/uL (ref 4.0–10.5)

## 2011-02-01 LAB — GLUCOSE, CAPILLARY: Glucose-Capillary: 112 mg/dL — ABNORMAL HIGH (ref 70–99)

## 2011-02-02 LAB — GLUCOSE, CAPILLARY

## 2011-02-15 ENCOUNTER — Ambulatory Visit (INDEPENDENT_AMBULATORY_CARE_PROVIDER_SITE_OTHER): Payer: BC Managed Care – HMO | Admitting: Psychiatry

## 2011-02-15 DIAGNOSIS — F332 Major depressive disorder, recurrent severe without psychotic features: Secondary | ICD-10-CM

## 2011-02-19 NOTE — Discharge Summary (Signed)
  Traci Ellison, Traci Ellison             ACCOUNT NO.:  [id number]  MEDICAL RECORD NO.:  [id number]  LOCATION:  0500                          [facility]ILITY:  [facility]  PHYSICIAN:  Franchot Gallo, MD     DATE OF BIRTH:  11/08/1985  DATE OF ADMISSION:  01/30/2011 DATE OF DISCHARGE:  02/02/2011                              DISCHARGE SUMMARY   REASON FOR ADMISSION:  The patient took an overdose of Prozac wanting to get attention.  HISTORY:  This is a 25 year old female that was admitted after ingesting 50 Prozac tablets.  Again,  wanting to get attention from her husband. Having depressive symptoms for the past 2-3 months.  Recent separation with her husband.  She was having trouble with sleep.  FINAL IMPRESSION:  AXIS I:  Major depressive disorder recurrent, severe. Nicotine dependence. AXIS II:  Deferred. AXIS III:  Insulin-dependent diabetes mellitus. AXIS IV:  Recent separation from husband.  Limited primary support system.AXIS V:  Global assessment of 65.  PERTINENT LABS:  Urine pregnancy test was negative.  Urine drug screen was negative.  Glucose was elevated at 158.  Alcohol level less than 11. Potassium of 3.3.  SIGNIFICANT FINDINGS:  The patient was admitted to the adult milieu.  We restarted her on her antidepressant Prozac at 40 mg to address depressive symptoms and Depakote to provide mood stabilization.  We had trazodone for sleep.  We did not restart her on Klonopin and ordered further labs.  She was participating in groups.  She was reporting good sleep and good appetite.  Her depression had resolved.  She had adamantly denied any suicidal or homicidal thoughts.  She was having no anxiety and having no medication side effects.  We had contact with the patient's mother to address any safety concerns and for [place] to provide information on suicide risk factors, prevention and intervention.  On day of discharge, the patient was seen in the Interdisciplinary Treatment Team.  Her sleep  was good.  Appetite was good.  Her depression had resolved rating it 0 on a scale of 1-10.  Adamantly denied any suicidal or homicidal thoughts.  No auditory or visual hallucinations. Anxiety was resolved rating it 0 on a scale of 1-10.  The patient was stable for discharge to aftercare.  MEDICATIONS: 1. Depakote ER 500 mg one tablet b.i.d. 2. Prozac 20 mg two capsules daily. 3. Trazodone 100 mg nightly p.r.n. for sleep. 4. The patient will resume her insulin pump.  FOLLOW UP: 1. Her follow up appointment was with Florencia Reasons on Thursday, February 15, 2011 at 9:30, phone number [phone]. 2. Dr. Elson Clan on Tuesday, February 06, 2011 at 2 p.m., phone number [phone].     Landry Corporal, N.P.   ______________________________ Franchot Gallo, MD    JO/MEDQ  D:  02/02/2011  T:  02/03/2011  Job:  [id number]  Electronically Signed by Limmie PatriciaP. on 02/19/2011 11:33:13 AM Electronically Signed by Franchot Gallo MD on 02/19/2011 12:03:13 PM

## 2011-02-22 ENCOUNTER — Encounter (INDEPENDENT_AMBULATORY_CARE_PROVIDER_SITE_OTHER): Payer: BC Managed Care – HMO | Admitting: Psychiatry

## 2011-02-22 DIAGNOSIS — F332 Major depressive disorder, recurrent severe without psychotic features: Secondary | ICD-10-CM

## 2011-03-01 ENCOUNTER — Encounter (HOSPITAL_COMMUNITY): Payer: BC Managed Care – HMO | Admitting: Psychiatry

## 2011-03-20 ENCOUNTER — Encounter (INDEPENDENT_AMBULATORY_CARE_PROVIDER_SITE_OTHER): Payer: BC Managed Care – HMO | Admitting: Psychiatry

## 2011-03-20 DIAGNOSIS — F332 Major depressive disorder, recurrent severe without psychotic features: Secondary | ICD-10-CM

## 2011-03-29 ENCOUNTER — Encounter (HOSPITAL_COMMUNITY): Payer: BC Managed Care – HMO | Admitting: Psychiatry

## 2011-05-14 LAB — POCT I-STAT CREATININE
Creatinine, Ser: 0.7
Operator id: 189501

## 2011-05-14 LAB — I-STAT 8, (EC8 V) (CONVERTED LAB)
BUN: 9
Chloride: 108
Glucose, Bld: 186 — ABNORMAL HIGH
HCT: 34 — ABNORMAL LOW
Hemoglobin: 11.6 — ABNORMAL LOW
Operator id: 189501
Potassium: 3.6
Sodium: 141

## 2011-05-14 LAB — URINALYSIS, ROUTINE W REFLEX MICROSCOPIC
Bilirubin Urine: NEGATIVE
Ketones, ur: 15 — AB
Leukocytes, UA: NEGATIVE
Nitrite: NEGATIVE
Urobilinogen, UA: 0.2

## 2011-05-14 LAB — DIFFERENTIAL
Eosinophils Relative: 1
Lymphocytes Relative: 23
Lymphs Abs: 1.5
Monocytes Absolute: 0.3

## 2011-05-14 LAB — CBC
HCT: 33.3 — ABNORMAL LOW
Hemoglobin: 11.6 — ABNORMAL LOW
WBC: 6.7

## 2011-08-06 ENCOUNTER — Emergency Department (HOSPITAL_COMMUNITY)
Admission: EM | Admit: 2011-08-06 | Discharge: 2011-08-07 | Disposition: A | Discharge status: Home / Self Care | Payer: BC Managed Care – HMO | Attending: Emergency Medicine | Admitting: Emergency Medicine

## 2011-08-06 DIAGNOSIS — E860 Dehydration: Secondary | ICD-10-CM | POA: Insufficient documentation

## 2011-08-06 DIAGNOSIS — R630 Anorexia: Secondary | ICD-10-CM | POA: Insufficient documentation

## 2011-08-06 DIAGNOSIS — O24919 Unspecified diabetes mellitus in pregnancy, unspecified trimester: Secondary | ICD-10-CM

## 2011-08-06 DIAGNOSIS — E119 Type 2 diabetes mellitus without complications: Secondary | ICD-10-CM | POA: Insufficient documentation

## 2011-08-06 DIAGNOSIS — F17200 Nicotine dependence, unspecified, uncomplicated: Secondary | ICD-10-CM | POA: Insufficient documentation

## 2011-08-06 DIAGNOSIS — R5383 Other fatigue: Secondary | ICD-10-CM | POA: Insufficient documentation

## 2011-08-06 DIAGNOSIS — R197 Diarrhea, unspecified: Secondary | ICD-10-CM

## 2011-08-06 DIAGNOSIS — O210 Mild hyperemesis gravidarum: Secondary | ICD-10-CM | POA: Insufficient documentation

## 2011-08-06 DIAGNOSIS — Z794 Long term (current) use of insulin: Secondary | ICD-10-CM | POA: Insufficient documentation

## 2011-08-06 DIAGNOSIS — R5381 Other malaise: Secondary | ICD-10-CM | POA: Insufficient documentation

## 2011-08-06 LAB — BASIC METABOLIC PANEL
CO2: 24 mEq/L (ref 19–32)
Calcium: 9.8 mg/dL (ref 8.4–10.5)
Creatinine, Ser: 0.51 mg/dL (ref 0.50–1.10)
GFR calc Af Amer: >90 mL/min (ref >90)
GFR calc non Af Amer: >90 mL/min (ref >90)

## 2011-08-06 LAB — DIFFERENTIAL
Eosinophils Absolute: 0.1 10*3/uL (ref 0.0–0.7)
Eosinophils Relative: 1 % (ref 0–5)
Lymphs Abs: 1.4 10*3/uL (ref 0.7–4.0)
Monocytes Relative: 8 % (ref 3–12)

## 2011-08-06 LAB — URINALYSIS, ROUTINE W REFLEX MICROSCOPIC
Glucose, UA: >1000 mg/dL — AB
Ketones, ur: NEGATIVE mg/dL
Leukocytes, UA: NEGATIVE
Protein, ur: NEGATIVE mg/dL
Urobilinogen, UA: 0.2 mg/dL (ref 0.0–1.0)

## 2011-08-06 LAB — CBC
Hemoglobin: 12.3 g/dL (ref 12.0–15.0)
MCH: 31.9 pg (ref 26.0–34.0)
MCV: 90.7 fL (ref 78.0–100.0)
Platelets: 208 10*3/uL (ref 150–400)
RBC: 3.86 MIL/uL — ABNORMAL LOW (ref 3.87–5.11)

## 2011-08-06 LAB — URINE MICROSCOPIC-ADD ON

## 2011-08-06 MED ORDER — DEXTROSE-NACL 5-0.45 % IV SOLN: INTRAVENOUS | Status: DC

## 2011-08-06 MED ORDER — INSULIN REGULAR BOLUS VIA INFUSION
0.0000 [IU] | Freq: Three times a day (TID) | INTRAVENOUS | Status: DC
  Filled 2011-08-06 (×5): qty 10

## 2011-08-06 MED ORDER — DEXTROSE 50 % IV SOLN: 25.0000 mL | INTRAVENOUS | Status: DC | PRN

## 2011-08-06 MED ORDER — ONDANSETRON HCL 4 MG/2ML IJ SOLN
4.0000 mg | Freq: Once | INTRAMUSCULAR | Status: AC
  Administered 2011-08-06: 4 mg via INTRAVENOUS
  Filled 2011-08-06: qty 2

## 2011-08-06 MED ORDER — SODIUM CHLORIDE 0.9 % IV SOLN
INTRAVENOUS | Status: DC
  Filled 2011-08-06: qty 1

## 2011-08-06 MED ORDER — SODIUM CHLORIDE 0.9 % IV SOLN: INTRAVENOUS | Status: DC

## 2011-08-06 MED ORDER — SODIUM CHLORIDE 0.9 % IV BOLUS (SEPSIS)
1000.0000 mL | Freq: Once | INTRAVENOUS | Status: AC
  Administered 2011-08-06: 1000 mL via INTRAVENOUS

## 2011-08-06 MED ORDER — ONDANSETRON HCL 4 MG/2ML IJ SOLN: 4.0000 mg | Freq: Once | INTRAMUSCULAR | Status: DC

## 2011-08-06 NOTE — ED Notes (Addendum)
Pt reports being seen at [facility] earlier today. Reports at that time BS was 460.  States that she received no treatment there. Pt is known diabetic.  IV started, lab samples collected.

## 2011-08-06 NOTE — ED Notes (Signed)
Pt presents with cough, nasal congestion, vomiting, and diarrhea since Friday. Pt states she was told recently she is pregnant. Pt is unsure of how far along she is.

## 2011-08-06 NOTE — ED Notes (Addendum)
Pt to room.  Reports nausea and diarrhea for 3 days.  Reporting some generalized muscle aches. Pt reports she is pregnant, but isn't sure how far along.  Reports LMP was approximately Oct 25.  Pt did void, urine very pale and clear.  Pt reports she has been "drinking a lot and trying to stay hydrated."

## 2011-08-07 MED ORDER — PRENATAL RX 60-1 MG PO TABS: 1.0000 | ORAL_TABLET | Freq: Every day | ORAL | Status: DC

## 2011-08-07 MED ORDER — ONDANSETRON HCL 4 MG PO TABS: 4.0000 mg | ORAL_TABLET | Freq: Three times a day (TID) | ORAL | Status: AC | PRN

## 2011-08-07 MED ORDER — INSULIN ASPART 100 UNIT/ML ~~LOC~~ SOLN
10.0000 [IU] | Freq: Once | SUBCUTANEOUS | Status: AC
  Administered 2011-08-07: 10 [IU] via SUBCUTANEOUS
  Filled 2011-08-07: qty 3

## 2011-08-07 NOTE — ED Notes (Signed)
Traci Amor, PA at bedside.  Second NS bolus started.  Pt has decided that she does not want to be admitted.  Orders received for insulin sq.

## 2011-08-09 NOTE — ED Provider Notes (Signed)
History     CSN: [id number]  Arrival date & time 08/06/11  2116   First MD Initiated Contact with Patient 08/06/11 2142      Chief Complaint  Patient presents with  . Emesis  . Cough  . Nasal Congestion  . Diarrhea    (Consider location/radiation/quality/duration/timing/severity/associated sxs/prior treatment) Patient is a 26 y.o. female presenting with vomiting and diarrhea. The history is provided by the patient.  Emesis  This is a new problem. Episode onset: 3 days ago. The problem has been resolved (She reports her nausea persists but has had no vomiting today and one episode of soft stool today.  She recently found out she is pregnant,  LMP 05/31/11,  has not established prenatal care yet.). The emesis has an appearance of stomach contents. There has been no fever. Associated symptoms include diarrhea. Pertinent negatives include no abdominal pain, no arthralgias, no chills, no fever and no headaches.  Diarrhea The primary symptoms include fatigue, nausea, vomiting and diarrhea. Primary symptoms do not include fever, abdominal pain, dysuria, arthralgias or rash. The illness began 3 to 5 days ago. The onset was gradual. The problem has been gradually improving.  The illness is also significant for anorexia. The illness does not include chills. Associated symptoms comments: She has been drinking plenty of fluids to try to maintain hydration.  She states she went to another hospital today,  Did not get seen,  But her blood sugar was checked and was 460.  Marland Kitchen    Past Medical History  Diagnosis Date  . Diabetes mellitus     History reviewed. No pertinent past surgical history.  History reviewed. No pertinent family history.  History  Substance Use Topics  . Smoking status: Current Everyday Smoker -- 1.0 packs/day  . Smokeless tobacco: Not on file  . Alcohol Use: No    OB History    Grav Para Term Preterm Abortions TAB SAB Ect Mult Living   4 1   2  2   1       Review of  Systems  Constitutional: Positive for fatigue. Negative for fever and chills.  HENT: Negative for congestion, sore throat and neck pain.   Eyes: Negative.   Respiratory: Negative for chest tightness and shortness of breath.   Cardiovascular: Negative for chest pain.  Gastrointestinal: Positive for nausea, vomiting, diarrhea and anorexia. Negative for abdominal pain.  Genitourinary: Negative.  Negative for dysuria and decreased urine volume.  Musculoskeletal: Negative for joint swelling and arthralgias.  Skin: Negative.  Negative for rash and wound.  Neurological: Negative for dizziness, weakness, light-headedness, numbness and headaches.  Hematological: Negative.   Psychiatric/Behavioral: Negative.     Allergies  Review of patient's allergies indicates no known allergies.  Home Medications   Current Outpatient Rx  Name Route Sig Dispense Refill  . INSULIN DETEMIR 100 UNIT/ML [place] SOLN Subcutaneous Inject 30 Units into the skin daily.      . INSULIN LISPRO (HUMAN) 100 UNIT/ML [place] SOLN Subcutaneous Inject into the skin as directed. Per sliding scale     . FLINTSTONES GUMMIES PLUS PO Oral Take 1 each by mouth daily.      Marland Kitchen ONDANSETRON HCL 4 MG PO TABS Oral Take 1 tablet (4 mg total) by mouth every 8 (eight) hours as needed for nausea. 20 tablet 0  . PRENATAL RX 60-1 MG PO TABS Oral Take 1 tablet by mouth daily. 30 tablet 0    BP 109/62  Pulse 92  Temp(Src) 98 F (36.7  C) (Oral)  Resp 18  Ht 5\' 2"  (1.575 m)  Wt 100 lb (45.36 kg)  BMI 18.29 kg/m2  SpO2 99%  LMP 05/31/2011  Physical Exam  Nursing note and vitals reviewed. Constitutional: She is oriented to person, place, and time. She appears well-developed and well-nourished.  HENT:  Head: Normocephalic and atraumatic.  Mouth/Throat: Uvula is midline. Mucous membranes are dry.  Eyes: Conjunctivae are normal.  Neck: Normal range of motion.  Cardiovascular: Normal rate, regular rhythm, normal heart sounds and intact distal  pulses.   Pulmonary/Chest: Effort normal and breath sounds normal. She has no wheezes.  Abdominal: Soft. Bowel sounds are normal. There is no tenderness.  Musculoskeletal: Normal range of motion.  Neurological: She is alert and oriented to person, place, and time.  Skin: Skin is warm and dry.  Psychiatric: She has a normal mood and affect.    ED Course  Procedures (including critical care time)  Labs Reviewed  BASIC METABOLIC PANEL - Abnormal; Notable for the following:    Sodium 133 (*)    Glucose, Bld 499 (*)    All other components within normal limits  URINALYSIS, ROUTINE W REFLEX MICROSCOPIC - Abnormal; Notable for the following:    Specific Gravity, Urine <1.005 (*)    Glucose, UA >1000 (*)    All other components within normal limits  HCG, QUANTITATIVE, PREGNANCY - Abnormal; Notable for the following:    hCG, Beta Chain, Quant, S 40350 (*)    All other components within normal limits  CBC - Abnormal; Notable for the following:    RBC 3.86 (*)    HCT 35.0 (*)    All other components within normal limits  GLUCOSE, CAPILLARY - Abnormal; Notable for the following:    Glucose-Capillary 453 (*)    All other components within normal limits  GLUCOSE, CAPILLARY - Abnormal; Notable for the following:    Glucose-Capillary 201 (*)    All other components within normal limits  PREGNANCY, URINE  DIFFERENTIAL  URINE MICROSCOPIC-ADD ON  LAB REPORT - SCANNED   No results found.   1. Vomiting and diarrhea   2. Pregnancy in diabetes    2 liters of NS given patient in ed.  Discussed admission for dehydration and hyperglycemia.  Patient adamant about not staying overnight.  Given novolog 10 units.  Repeat blood glucose prior to dc 201.     MDM  Zofran,  Prenatal vitamin.  Patient referred to Dr Emelda Fear to establish prenatal care.        Candis Musa, PA 08/09/11 [phone]

## 2011-08-10 NOTE — ED Provider Notes (Signed)
Medical screening examination/treatment/procedure(s) were performed by non-physician practitioner and as supervising physician I was immediately available for consultation/collaboration.  Nicoletta Dress. Colon Branch, MD 08/10/11 [phone]

## 2011-08-18 ENCOUNTER — Encounter (HOSPITAL_COMMUNITY): Payer: Self-pay | Admitting: *Deleted

## 2011-08-18 ENCOUNTER — Inpatient Hospital Stay (HOSPITAL_COMMUNITY)
Admission: EM | Admit: 2011-08-18 | Discharge: 2011-08-19 | Disposition: A | Discharge status: Home / Self Care | Payer: BC Managed Care – HMO | Attending: Obstetrics and Gynecology | Admitting: Obstetrics and Gynecology

## 2011-08-18 DIAGNOSIS — N938 Other specified abnormal uterine and vaginal bleeding: Secondary | ICD-10-CM | POA: Insufficient documentation

## 2011-08-18 DIAGNOSIS — N939 Abnormal uterine and vaginal bleeding, unspecified: Secondary | ICD-10-CM

## 2011-08-18 DIAGNOSIS — N949 Unspecified condition associated with female genital organs and menstrual cycle: Secondary | ICD-10-CM | POA: Insufficient documentation

## 2011-08-18 DIAGNOSIS — R109 Unspecified abdominal pain: Secondary | ICD-10-CM | POA: Insufficient documentation

## 2011-08-18 LAB — URINALYSIS, ROUTINE W REFLEX MICROSCOPIC
Glucose, UA: >1000 mg/dL — AB
Ketones, ur: NEGATIVE mg/dL
Leukocytes, UA: NEGATIVE
Protein, ur: NEGATIVE mg/dL

## 2011-08-18 LAB — COMPREHENSIVE METABOLIC PANEL
AST: 9 U/L (ref 0–37)
Albumin: 3.5 g/dL (ref 3.5–5.2)
Calcium: 9.6 mg/dL (ref 8.4–10.5)
Creatinine, Ser: 0.48 mg/dL — ABNORMAL LOW (ref 0.50–1.10)

## 2011-08-18 LAB — DIFFERENTIAL
Basophils Absolute: 0 10*3/uL (ref 0.0–0.1)
Basophils Relative: 0 % (ref 0–1)
Eosinophils Absolute: 0.1 10*3/uL (ref 0.0–0.7)
Eosinophils Relative: 1 % (ref 0–5)
Monocytes Absolute: 0.3 10*3/uL (ref 0.1–1.0)

## 2011-08-18 LAB — CBC
HCT: 35.9 % — ABNORMAL LOW (ref 36.0–46.0)
MCH: 32.3 pg (ref 26.0–34.0)
MCHC: 35.9 g/dL (ref 30.0–36.0)
MCV: 90 fL (ref 78.0–100.0)
RDW: 12.6 % (ref 11.5–15.5)

## 2011-08-18 LAB — GLUCOSE, CAPILLARY: Glucose-Capillary: 288 mg/dL — ABNORMAL HIGH (ref 70–99)

## 2011-08-18 LAB — URINE CULTURE
Culture  Setup Time: 201301131210
Culture: NO GROWTH

## 2011-08-18 LAB — URINE MICROSCOPIC-ADD ON

## 2011-08-18 MED ORDER — MORPHINE SULFATE 4 MG/ML IJ SOLN
4.0000 mg | Freq: Once | INTRAMUSCULAR | Status: AC
  Administered 2011-08-18: 4 mg via INTRAVENOUS
  Filled 2011-08-18: qty 1

## 2011-08-18 NOTE — ED Notes (Signed)
The pt had an abortion on Monday at an abortion clinic.  For the past 2 hours she has had abd pain .  She was 9 weeks preg.  She has a small amount of vaginal bleeding.  No one answered at the abortion clinic

## 2011-08-18 NOTE — ED Provider Notes (Signed)
History     CSN: [id number]  Arrival date & time 08/18/11  2158   First MD Initiated Contact with Patient 08/18/11 2252      Chief Complaint  Patient presents with  . Abdominal Pain    (Consider location/radiation/quality/duration/timing/severity/associated sxs/prior treatment) HPI Comments: 26 y/o female with hx of DM who had abortion on Moday at abortion clinic in [place], followed up at the same clinic on Thrusday b/c of fever and vomiting - told to go to [facility] hospital b/c of what appeared to be retained products.  Was well on Friday but today had increased pain which is cramping in nature, constant, nothing makes better or worse and not associated with nausea, fever or cough today.  She did have an URI last week but seem to get over the cough.  Sx are moderate at this time.  Patient is a 26 y.o. female presenting with abdominal pain. The history is provided by the patient, a friend and medical records.  Abdominal Pain The primary symptoms of the illness include abdominal pain.    Past Medical History  Diagnosis Date  . Diabetes mellitus     History reviewed. No pertinent past surgical history.  History reviewed. No pertinent family history.  History  Substance Use Topics  . Smoking status: Current Everyday Smoker -- 1.0 packs/day  . Smokeless tobacco: Not on file  . Alcohol Use: No    OB History    Grav Para Term Preterm Abortions TAB SAB Ect Mult Living   4 1   2  2   1       Review of Systems  Gastrointestinal: Positive for abdominal pain.  All other systems reviewed and are negative.    Allergies  Review of patient's allergies indicates no known allergies.  Home Medications   Current Outpatient Rx  Name Route Sig Dispense Refill  . ACETAMINOPHEN 500 MG PO TABS Oral Take 1,000 mg by mouth every 6 (six) hours as needed. For pain    . CITALOPRAM HYDROBROMIDE 10 MG PO TABS Oral Take 10-20 mg by mouth daily as needed. For mood stabilization    . DIVALPROEX  SODIUM ER 500 MG PO TB24 Oral Take 1,000 mg by mouth at bedtime.    . INSULIN DETEMIR 100 UNIT/ML [place] SOLN Subcutaneous Inject 30 Units into the skin daily.      . INSULIN LISPRO (HUMAN) 100 UNIT/ML [place] SOLN Subcutaneous Inject into the skin 3 (three) times daily before meals. Per sliding scale    . NORETHIN-ETH ESTRAD-FE BIPHAS 1 MG-10 MCG / 10 MCG PO TABS Oral Take 1 tablet by mouth daily.      BP 95/58  Pulse 63  Temp(Src) 98 F (36.7 C) (Oral)  Resp 16  SpO2 100%  LMP 05/31/2011  Physical Exam  Nursing note and vitals reviewed. Constitutional: She appears well-developed and well-nourished. No distress.       Appears in mild discomfort  HENT:  Head: Normocephalic and atraumatic.  Mouth/Throat: Oropharynx is clear and moist. No oropharyngeal exudate.  Eyes: Conjunctivae and EOM are normal. Pupils are equal, round, and reactive to light. Right eye exhibits no discharge. Left eye exhibits no discharge. No scleral icterus.  Neck: Normal range of motion. Neck supple. No JVD present. No thyromegaly present.  Cardiovascular: Normal rate, regular rhythm, normal heart sounds and intact distal pulses.  Exam reveals no gallop and no friction rub.   No murmur heard. Pulmonary/Chest: Effort normal. No respiratory distress. She has no wheezes. She has  rales ( scattered rales, intermittent on the L, no distress, speaks in full sentences, no acc muscle use or inc WOB).  Abdominal: Soft. Bowel sounds are normal. She exhibits no distension and no mass. There is tenderness ( soft but ttp in the SP area, L and RLQ and the epigastrium.  is most intense in the SP area). There is no guarding.  Musculoskeletal: Normal range of motion. She exhibits no edema and no tenderness.  Lymphadenopathy:    She has no cervical adenopathy.  Neurological: She is alert. Coordination normal.  Skin: Skin is warm and dry. No rash noted. No erythema.  Psychiatric: She has a normal mood and affect. Her behavior is normal.     ED Course  Procedures (including critical care time)  Labs Reviewed  URINALYSIS, ROUTINE W REFLEX MICROSCOPIC - Abnormal; Notable for the following:    Specific Gravity, Urine 1.036 (*)    Glucose, UA >1000 (*)    Hgb urine dipstick LARGE (*)    All other components within normal limits  CBC - Abnormal; Notable for the following:    HCT 35.9 (*)    All other components within normal limits  COMPREHENSIVE METABOLIC PANEL - Abnormal; Notable for the following:    Glucose, Bld 353 (*)    Creatinine, Ser 0.48 (*)    Total Bilirubin 0.2 (*)    All other components within normal limits  GLUCOSE, CAPILLARY - Abnormal; Notable for the following:    Glucose-Capillary 288 (*)    All other components within normal limits  URINE MICROSCOPIC-ADD ON - Abnormal; Notable for the following:    Bacteria, UA FEW (*)    All other components within normal limits  DIFFERENTIAL  URINE CULTURE  POCT CBG MONITORING   Dg Chest 2 View  08/19/2011  *RADIOLOGY REPORT*  Clinical Data: Severe abdominal cramping; recent respiratory infection.  Cough.  CHEST - 2 VIEW  Comparison: Chest radiograph performed 02/28 1010  Findings: The lungs are well-aerated and clear.  There is no evidence of focal opacification, pleural effusion or pneumothorax.  The heart is normal in size; the mediastinal contour is within normal limits.  No acute osseous abnormalities are seen.  IMPRESSION: No acute cardiopulmonary process seen.  Original Report Authenticated By: Tonia Ghent, M.D.   US Transvaginal Non-ob  08/19/2011  *RADIOLOGY REPORT*  Clinical Data: Vaginal bleeding and lower abdominal pain; recent abortion.  Assess for retained products of conception.  TRANSABDOMINAL AND TRANSVAGINAL ULTRASOUND OF PELVIS Technique:  Both transabdominal and transvaginal ultrasound examinations of the pelvis were performed. Transabdominal technique was performed for global imaging of the pelvis including uterus, ovaries, adnexal regions,  and pelvic cul-de-sac.  Comparison: Prior ultrasound of pregnancy performed 02/04/2007, and CT of the abdomen and pelvis performed 10/04/2008   It was necessary to proceed with endovaginal exam following the transabdominal exam to visualize the uterus and ovaries in greater detail.  Findings:  Uterus: Normal in size; retroverted, measuring 8.0 x 5.6 x 6.0 cm.  Endometrium: Thickened, measuring 1.3 cm, with heterogeneous echogenicity likely reflecting blood products.  No significant focal increased blood flow is seen on color Doppler evaluation to suggest retained products of conception.  Right ovary:  Normal appearance/no adnexal mass; measures 3.9 x 2.4 x 2.1 cm.  Left ovary: Normal appearance/no adnexal mass; measures 3.1 x 2.0 x 1.3 cm.  Mildly prominent vessels are noted about the left adnexa.  Other findings: Trace free fluid noted within the pelvic cul-de- sac.  IMPRESSION:  1.  No definite evidence of retained products of conception. 2.  Thickening of the endometrium to 1.3 cm, with heterogeneous echogenicity, likely reflecting blood products; uterus otherwise unremarkable in appearance. 3.  Mildly prominent vasculature about the left adnexa; ovaries otherwise unremarkable in appearance.  Original Report Authenticated By: Tonia Ghent, M.D.   US Pelvis Complete  08/19/2011  *RADIOLOGY REPORT*  Clinical Data: Vaginal bleeding and lower abdominal pain; recent abortion.  Assess for retained products of conception.  TRANSABDOMINAL AND TRANSVAGINAL ULTRASOUND OF PELVIS Technique:  Both transabdominal and transvaginal ultrasound examinations of the pelvis were performed. Transabdominal technique was performed for global imaging of the pelvis including uterus, ovaries, adnexal regions, and pelvic cul-de-sac.  Comparison: Prior ultrasound of pregnancy performed 02/04/2007, and CT of the abdomen and pelvis performed 10/04/2008   It was necessary to proceed with endovaginal exam following the transabdominal exam to  visualize the uterus and ovaries in greater detail.  Findings:  Uterus: Normal in size; retroverted, measuring 8.0 x 5.6 x 6.0 cm.  Endometrium: Thickened, measuring 1.3 cm, with heterogeneous echogenicity likely reflecting blood products.  No significant focal increased blood flow is seen on color Doppler evaluation to suggest retained products of conception.  Right ovary:  Normal appearance/no adnexal mass; measures 3.9 x 2.4 x 2.1 cm.  Left ovary: Normal appearance/no adnexal mass; measures 3.1 x 2.0 x 1.3 cm.  Mildly prominent vessels are noted about the left adnexa.  Other findings: Trace free fluid noted within the pelvic cul-de- sac.  IMPRESSION:  1.  No definite evidence of retained products of conception. 2.  Thickening of the endometrium to 1.3 cm, with heterogeneous echogenicity, likely reflecting blood products; uterus otherwise unremarkable in appearance. 3.  Mildly prominent vasculature about the left adnexa; ovaries otherwise unremarkable in appearance.  Original Report Authenticated By: Tonia Ghent, M.D.     1. Abdominal pain   2. Vaginal bleeding       MDM  Will need to r/o retained products, UTI.  CXR to investigate rales.  Morphine, fluids   Blood work shows no leukocytosis, urinalysis shows some blood, cooperative metabolic panel normal, glucose shows mild hyperglycemia, ultrasound shows possible retained blood in the uterus consistent with either endometriosis or retained products of conception. Patient has not had a fever and pain has improved to 3/10 after morphine intravenously. I have discussed the patient's care with Dr. Dareen Piano at the [facility] hospital on call for gynecology, he has accepted the patient in transfer for further evaluation.    Vida Roller, MD 08/19/11 [phone]

## 2011-08-18 NOTE — ED Notes (Signed)
Dr. Hyacinth Meeker at [facility] for examination.  Pt c/o lower abdominal pain in the suprapubic region.  Pain is worse with palpation. Pt c/o mild nausea  without emesis.  She reports that she had a fever on Thursday , unknown how high "over 100."

## 2011-08-19 ENCOUNTER — Emergency Department (HOSPITAL_COMMUNITY): Payer: BC Managed Care – HMO

## 2011-08-19 ENCOUNTER — Encounter (HOSPITAL_COMMUNITY): Payer: Self-pay

## 2011-08-19 LAB — GLUCOSE, CAPILLARY

## 2011-08-19 MED ORDER — LORAZEPAM 2 MG/ML IJ SOLN
0.5000 mg | Freq: Once | INTRAMUSCULAR | Status: AC
  Administered 2011-08-19: 0.5 mg via INTRAVENOUS
  Filled 2011-08-19 (×2): qty 1

## 2011-08-19 MED ORDER — CEFTRIAXONE SODIUM 1 G IJ SOLR
1.0000 g | Freq: Once | INTRAMUSCULAR | Status: AC
  Administered 2011-08-19: 1 g via INTRAMUSCULAR
  Filled 2011-08-19: qty 10

## 2011-08-19 MED ORDER — LIDOCAINE HCL 1 % IJ SOLN: 1.0000 g | Freq: Once | INTRAMUSCULAR | Status: DC

## 2011-08-19 NOTE — Progress Notes (Signed)
Patient is transferred from [place] by [facility] due to possible retained products of conception. She states that she had an abortion on the 7th(monday) and on Thursday she started having fever, lower back and abdominal cramping that got severe last night. She denies any n/v/fever, she states that she is having scant to small amount of bleeding. Patient had a quarter size dark red blood stain on her pad that she came in with.

## 2011-08-19 NOTE — ED Notes (Signed)
Patient transported to Ultrasound 

## 2011-08-19 NOTE — ED Notes (Signed)
Discharge instructions were reviewed with patient and husband, patient taken via wheelchair to vehicle driven by her husband, patient and husband verbalized an understanding of instructions given and follow up required.

## 2011-08-19 NOTE — Progress Notes (Signed)
Dr. Dareen Piano notified that Medical records staff from [facility] called and states that patient was not seen this past week. Their record is showing that the patient was at [facility] on dec. 29th and 30th and she left both times prior to physicians evaluations due to wait times. Dr Dareen Piano is aware that patient insists that she was there on Thursday jan. 10th and a physician performed a pelvic exam only. He is notified that patient c/o of a dull abdominal cramping at this time, no active bleeding, bhcg( done on 12/31). He did not give any orders at this time or state a proceeding plan of care.

## 2011-08-19 NOTE — ED Notes (Signed)
[facility] called for transportation  

## 2011-08-19 NOTE — Progress Notes (Signed)
Dr Dareen Piano notified that patient that he accepted from [facility] ED is now here and c/o pain and nausea. Notified him of her vital signs and bleeding status. Dr Dareen Piano states that patient records from [facility] last week should be obtained and call him back once the records is sent.

## 2011-08-19 NOTE — ED Notes (Signed)
Patient is resting comfortably; no complaints at this time; family at bedside.

## 2011-08-19 NOTE — ED Notes (Signed)
[facility] at [facility].

## 2011-08-20 LAB — GC/CHLAMYDIA PROBE AMP, GENITAL: Chlamydia, DNA Probe: NEGATIVE

## 2011-10-18 ENCOUNTER — Encounter (HOSPITAL_COMMUNITY): Payer: Self-pay | Admitting: Dietician

## 2011-10-18 NOTE — Progress Notes (Signed)
[facility] Diabetes Class Completion  Date:October 18, 2011  Time: 6:30 PM  Pt attended  Hospital[facility] Diabetes Class on October 18, 2011.   Patient was educated on the following topics: carbohydrate metabolism in relation to diabetes, sources of carbohydrate, carbohydrate counting, meal planning strategies, food label reading, and portion control.   Bryar Rennie A. Kayan, RD, LDN Date:October 18, 2011 Time: 6:30 PM 

## 2011-10-26 ENCOUNTER — Inpatient Hospital Stay (HOSPITAL_COMMUNITY): Payer: BC Managed Care – HMO

## 2011-10-26 ENCOUNTER — Inpatient Hospital Stay (HOSPITAL_COMMUNITY)
Admission: AD | Admit: 2011-10-26 | Discharge: 2011-10-26 | Disposition: A | Discharge status: Home / Self Care | Payer: BC Managed Care – HMO | Source: Ambulatory Visit | Attending: Obstetrics and Gynecology | Admitting: Obstetrics and Gynecology

## 2011-10-26 ENCOUNTER — Encounter (HOSPITAL_COMMUNITY): Payer: Self-pay

## 2011-10-26 DIAGNOSIS — R109 Unspecified abdominal pain: Secondary | ICD-10-CM | POA: Insufficient documentation

## 2011-10-26 DIAGNOSIS — N949 Unspecified condition associated with female genital organs and menstrual cycle: Secondary | ICD-10-CM | POA: Insufficient documentation

## 2011-10-26 DIAGNOSIS — N938 Other specified abnormal uterine and vaginal bleeding: Secondary | ICD-10-CM | POA: Insufficient documentation

## 2011-10-26 DIAGNOSIS — N946 Dysmenorrhea, unspecified: Secondary | ICD-10-CM | POA: Insufficient documentation

## 2011-10-26 HISTORY — DX: Anxiety disorder, unspecified: F41.9

## 2011-10-26 HISTORY — DX: Major depressive disorder, single episode, unspecified: F32.9

## 2011-10-26 HISTORY — DX: Depression, unspecified: F32.A

## 2011-10-26 LAB — URINALYSIS, ROUTINE W REFLEX MICROSCOPIC
Ketones, ur: NEGATIVE mg/dL
Leukocytes, UA: NEGATIVE
Nitrite: NEGATIVE
Protein, ur: NEGATIVE mg/dL
pH: 6 (ref 5.0–8.0)

## 2011-10-26 LAB — URINE MICROSCOPIC-ADD ON

## 2011-10-26 LAB — HCG, SERUM, QUALITATIVE: Preg, Serum: NEGATIVE

## 2011-10-26 LAB — CBC
Platelets: 260 10*3/uL (ref 150–400)
RBC: 4.44 MIL/uL (ref 3.87–5.11)
WBC: 11 10*3/uL — ABNORMAL HIGH (ref 4.0–10.5)

## 2011-10-26 LAB — WET PREP, GENITAL

## 2011-10-26 MED ORDER — KETOROLAC TROMETHAMINE 60 MG/2ML IM SOLN
60.0000 mg | Freq: Once | INTRAMUSCULAR | Status: AC
  Administered 2011-10-26: 60 mg via INTRAMUSCULAR
  Filled 2011-10-26: qty 2

## 2011-10-26 MED ORDER — NAPROXEN SODIUM 550 MG PO TABS: 550.0000 mg | ORAL_TABLET | Freq: Two times a day (BID) | ORAL | Status: DC

## 2011-10-26 NOTE — MAU Provider Note (Signed)
History   Pt presents today c/o heavy vag bleeding and severe lower abd pain. She states her menses is not due for a couple of days. However, she woke up this morning with severe lower abd cramping and heavy vag bleeding. She states she is bleeding much heavier than a normal period. She denies fever, vag irritation, or any other sx at this time.  CSN: [id number]  Arrival date and time: 10/26/11 4098   First Provider Initiated Contact with Patient 10/26/11 [phone]      Chief Complaint  Patient presents with  . Abdominal Pain  . Back Pain  . Vaginal Bleeding   HPI  OB History    Grav Para Term Preterm Abortions TAB SAB Ect Mult Living   4 1 1  3 1 2   1       Past Medical History  Diagnosis Date  . Diabetes mellitus     type 1  . Depression   . Anxiety     Past Surgical History  Procedure Date  . Induced abortion     History reviewed. No pertinent family history.  History  Substance Use Topics  . Smoking status: Current Everyday Smoker -- 1.0 packs/day  . Smokeless tobacco: Never Used  . Alcohol Use: No    Allergies: No Known Allergies  Prescriptions prior to admission  Medication Sig Dispense Refill  . Insulin Human (INSULIN PUMP) 100 unit/ml SOLN Inject 1 each into the skin 3 times daily with meals, bedtime and 2 AM. Pt uses humalog insulin in her pump      . insulin lispro (HUMALOG) 100 UNIT/ML injection Inject into the skin 3 (three) times daily before meals. Per sliding scale        Review of Systems  Constitutional: Negative for fever and chills.  Eyes: Negative for blurred vision and double vision.  Respiratory: Negative for cough, hemoptysis, sputum production, shortness of breath and wheezing.   Cardiovascular: Negative for chest pain and palpitations.  Gastrointestinal: Positive for abdominal pain. Negative for nausea, vomiting, diarrhea and constipation.  Genitourinary: Negative for dysuria, urgency, frequency and hematuria.  Neurological: Negative  for dizziness and headaches.  Psychiatric/Behavioral: Negative for depression and suicidal ideas.   Physical Exam   Blood pressure 106/73, pulse 102, temperature 97.1 F (36.2 C), resp. rate 16, height 5\' 2"  (1.575 m), weight 108 lb 3.2 oz (49.079 kg), last menstrual period 09/25/2010, unknown if currently breastfeeding.  Physical Exam  Nursing note and vitals reviewed. Constitutional: She is oriented to person, place, and time. She appears well-developed and well-nourished. No distress.  HENT:  Head: Normocephalic and atraumatic.  Eyes: EOM are normal. Pupils are equal, round, and reactive to light.  GI: Soft. She exhibits no distension and no mass. There is tenderness (Bilateral tenderness to lower quadrants and suprapubically. No rebound. No guarding.). There is no rebound and no guarding.  Genitourinary: There is bleeding around the vagina. No vaginal discharge found.       Moderate amount of vag bleeding noted. Cervix slightly dilated. Uterus NL size and shape but tender to palpation. No adnexal masses. Bilateral tenderness to adnexa.  Neurological: She is alert and oriented to person, place, and time.  Skin: Skin is warm and dry. She is not diaphoretic.  Psychiatric: She has a normal mood and affect. Her behavior is normal. Judgment and thought content normal.    MAU Course  Procedures  Wet prep and GC/Chlamydia cultures done.  Results for orders placed during the hospital encounter of  10/26/11 (from the past 72 hour(s))  URINALYSIS, ROUTINE W REFLEX MICROSCOPIC     Status: Abnormal   Collection Time   10/26/11  7:50 AM      Component Value Range Comment   Color, Urine YELLOW  YELLOW     APPearance CLEAR  CLEAR     Specific Gravity, Urine 1.015  1.005 - 1.030     pH 6.0  5.0 - 8.0     Glucose, UA >1000 (*) NEGATIVE (mg/dL)    Hgb urine dipstick LARGE (*) NEGATIVE     Bilirubin Urine NEGATIVE  NEGATIVE     Ketones, ur NEGATIVE  NEGATIVE (mg/dL)    Protein, ur NEGATIVE   NEGATIVE (mg/dL)    Urobilinogen, UA 0.2  0.0 - 1.0 (mg/dL)    Nitrite NEGATIVE  NEGATIVE     Leukocytes, UA NEGATIVE  NEGATIVE    URINE MICROSCOPIC-ADD ON     Status: Normal   Collection Time   10/26/11  7:50 AM      Component Value Range Comment   Squamous Epithelial / LPF RARE  RARE     WBC, UA 0-2  <3 (WBC/hpf)    RBC / HPF TOO NUMEROUS TO COUNT  <3 (RBC/hpf)   WET PREP, GENITAL     Status: Abnormal   Collection Time   10/26/11  8:40 AM      Component Value Range Comment   Yeast Wet Prep HPF POC NONE SEEN  NONE SEEN     Trich, Wet Prep NONE SEEN  NONE SEEN     Clue Cells Wet Prep HPF POC NONE SEEN  NONE SEEN     WBC, Wet Prep HPF POC FEW (*) NONE SEEN  MODERATE BACTERIA SEEN  CBC     Status: Abnormal   Collection Time   10/26/11  8:45 AM      Component Value Range Comment   WBC 11.0 (*) 4.0 - 10.5 (K/uL)    RBC 4.44  3.87 - 5.11 (MIL/uL)    Hemoglobin 14.2  12.0 - 15.0 (g/dL)    HCT 16.1  09.6 - 04.5 (%)    MCV 91.0  78.0 - 100.0 (fL)    MCH 32.0  26.0 - 34.0 (pg)    MCHC 35.1  30.0 - 36.0 (g/dL)    RDW 40.9  81.1 - 91.4 (%)    Platelets 260  150 - 400 (K/uL)   HCG, SERUM, QUALITATIVE     Status: Normal   Collection Time   10/26/11  8:45 AM      Component Value Range Comment   Preg, Serum NEGATIVE  NEGATIVE     US Transvaginal Non-ob  10/26/2011  *RADIOLOGY REPORT*  Clinical Data: Abdominal pain with heavy bleeding.  Post T A B in January 2013.  Negative urine pregnancy test.  LMP 09/26/2011  TRANSVAGINAL ULTRASOUND OF PELVIS  Technique:  Transvaginal ultrasound examination of the pelvis was performed including evaluation of the uterus, ovaries, adnexal regions, and pelvic cul-de-sac.  Comparison:  None.  Findings:  Uterus:  Is anteverted and retroflexed and demonstrates a sagittal length of 6.7 cm, depth of 4.6 cm and width of 4.8 cm.  A homogeneous uterine myometrium is seen  Endometrium: Appears homogeneously echogenic with an AP width of 7.5 mm.  No areas of focal  thickening, heterogeneity or abnormal vascularity with color Doppler evaluation are seen to suggest a focal abnormality or evidence for retained products of conception. The appearance would correlate with a presecretory endometrial  stripe and correspond with the given LMP of 09/26/2011  Right ovary: Has a normal appearance measuring 3.3 x 2.0 x 1.9 cm  Left ovary: Has a normal appearance measuring 2.1 x 3.0 x 1.6 cm  Other Findings:  A small amount of simple free fluid is noted in the cul-de-sac.  No separate adnexal masses are seen.  IMPRESSION: Normal pelvic ultrasound  Original Report Authenticated By: Bertha Stakes, M.D.     Assessment and Plan  Dysmenorrhea: discussed with pt at length. Her pain has completely resolved following IM toradol. Will give Rx for anaprox DS and she will f/u with her PCP. Discussed diet, activity, risks, and precautions.  Clinton Gallant. Peggi Yono III, DrHSc, MPAS, PA-C  10/26/2011, 8:39 AM

## 2011-10-26 NOTE — Discharge Instructions (Signed)
Dysmenorrhea Menstrual pain is caused by the muscles of the uterus tightening (contracting) during a menstrual period. The muscles of the uterus contract due to the chemicals in the uterine lining. Primary dysmenorrhea is menstrual cramps that last a couple of days when you start having menstrual periods or soon after. This often begins after a teenager starts having her period. As a woman gets older or has a baby, the cramps will usually lesson or disappear. Secondary dysmenorrhea begins later in life, lasts longer, and the pain may be stronger than primary dysmenorrhea. The pain may start before the period and last a few days after the period. This type of dysmenorrhea is usually caused by an underlying problem such as:  The tissue lining the uterus grows outside of the uterus in other areas of the body (endometriosis).   The endometrial tissue, which normally lines the uterus, is found in or grows into the muscular walls of the uterus (adenomyosis).   The pelvic blood vessels are engorged with blood just before the menstrual period (pelvic congestive syndrome).   Overgrowth of cells in the lining of the uterus or cervix (polyps of the uterus or cervix).   Falling down of the uterus (prolapse) because of loose or stretched ligaments.   Depression.   Bladder problems, infection, or inflammation.   Problems with the intestine, a tumor, or irritable bowel syndrome.   Cancer of the female organs or bladder.   A severely tipped uterus.   A very tight opening or closed cervix.   Noncancerous tumors of the uterus (fibroids).   Pelvic inflammatory disease (PID).   Pelvic scarring (adhesions) from a previous surgery.   Ovarian cyst.   An intrauterine device (IUD) used for birth control.  CAUSES  The cause of menstrual pain is often unknown. SYMPTOMS   Cramping or throbbing pain in your lower abdomen.   Sometimes, a woman may also experience headaches.   Lower back pain.    Feeling sick to your stomach (nausea) or vomiting.   Diarrhea.   Sweating or dizziness.  DIAGNOSIS  A diagnosis is based on your history, symptoms, physical examination, diagnostic tests, or procedures. Diagnostic tests or procedures may include:  Blood tests.   An ultrasound.   An examination of the lining of the uterus (dilation and curettage, D&C).   An examination inside your abdomen or pelvis with a scope (laparoscopy).   X-rays.   CT Scan.   MRI.   An examination inside the bladder with a scope (cystoscopy).   An examination inside the intestine or stomach with a scope (colonoscopy, gastroscopy).  TREATMENT  Treatment depends on the cause of the dysmenorrhea. Treatment may include:  Pain medicine prescribed by your caregiver.   Birth control pills.   Hormone replacement therapy.   Nonsteroidal anti-inflammatory drugs (NSAIDs). These may help stop the production of prostaglandins.   An IUD with progesterone hormone in it.   Acupuncture.   Surgery to remove adhesions, endometriosis, ovarian cyst, or fibroids.   Removal of the uterus (hysterectomy).   Progesterone shots to stop the menstrual period.   Cutting the nerves on the sacrum that go to the female organs (presacral neurectomy).   Electric currant to the sacral nerves (sacral nerve stimulation).   Antidepressant medicine.   Psychiatric therapy, counseling, or group therapy.   Exercise and physical therapy.   Meditation and yoga therapy.  HOME CARE INSTRUCTIONS   Only take over-the-counter or prescription medicines for pain, discomfort, or fever as directed by your   caregiver.   Place a heating pad or hot water bottle on your lower back or abdomen. Do not sleep with the heating pad.   Use aerobic exercises, walking, swimming, biking, and other exercises to help lessen the cramping.   Massage to the lower back or abdomen may help.   Stop smoking.   Avoid alcohol and caffeine.   Yoga,  meditation, or acupuncture may help.  SEEK MEDICAL CARE IF:   The pain does not get better with medicine.   You have pain with sexual intercourse.  SEEK IMMEDIATE MEDICAL CARE IF:   Your pain increases and is not controlled with medicines.   You have a fever.   You develop nausea or vomiting with your period not controlled with medicine.   You have abnormal vaginal bleeding with your period.   You pass out.  MAKE SURE YOU:   Understand these instructions.   Will watch your condition.   Will get help right away if you are not doing well or get worse.  Document Released: 07/23/2005 Document Revised: 07/12/2011 Document Reviewed: 11/08/2008 [employer] Patient Information 2012 [employer]. 

## 2011-10-26 NOTE — MAU Note (Signed)
Woke up this morning abdominal pain from lower region to upper, back pain and had gush of blood, might be pregnant had TAB in January.

## 2011-10-27 LAB — GC/CHLAMYDIA PROBE AMP, GENITAL: GC Probe Amp, Genital: NEGATIVE

## 2011-10-27 NOTE — MAU Provider Note (Signed)
Agree with above note.  Rakan Soffer 10/27/2011 7:34 AM

## 2011-12-07 ENCOUNTER — Emergency Department (HOSPITAL_COMMUNITY)
Admission: EM | Admit: 2011-12-07 | Discharge: 2011-12-07 | Disposition: A | Discharge status: Home / Self Care | Payer: BC Managed Care – HMO | Attending: Emergency Medicine | Admitting: Emergency Medicine

## 2011-12-07 ENCOUNTER — Encounter (HOSPITAL_COMMUNITY): Payer: Self-pay

## 2011-12-07 DIAGNOSIS — R739 Hyperglycemia, unspecified: Secondary | ICD-10-CM

## 2011-12-07 DIAGNOSIS — Z794 Long term (current) use of insulin: Secondary | ICD-10-CM | POA: Insufficient documentation

## 2011-12-07 DIAGNOSIS — M545 Low back pain, unspecified: Secondary | ICD-10-CM | POA: Insufficient documentation

## 2011-12-07 DIAGNOSIS — N390 Urinary tract infection, site not specified: Secondary | ICD-10-CM

## 2011-12-07 DIAGNOSIS — F341 Dysthymic disorder: Secondary | ICD-10-CM | POA: Insufficient documentation

## 2011-12-07 DIAGNOSIS — E109 Type 1 diabetes mellitus without complications: Secondary | ICD-10-CM | POA: Insufficient documentation

## 2011-12-07 LAB — DIFFERENTIAL
Basophils Relative: 0 % (ref 0–1)
Eosinophils Relative: 1 % (ref 0–5)
Monocytes Absolute: 0.4 10*3/uL (ref 0.1–1.0)
Monocytes Relative: 5 % (ref 3–12)
Neutro Abs: 5 10*3/uL (ref 1.7–7.7)

## 2011-12-07 LAB — CBC
HCT: 38 % (ref 36.0–46.0)
Hemoglobin: 13.7 g/dL (ref 12.0–15.0)
MCHC: 36.1 g/dL — ABNORMAL HIGH (ref 30.0–36.0)
MCV: 89.2 fL (ref 78.0–100.0)
RDW: 12 % (ref 11.5–15.5)

## 2011-12-07 LAB — BASIC METABOLIC PANEL
BUN: 8 mg/dL (ref 6–23)
CO2: 25 mEq/L (ref 19–32)
Calcium: 10.4 mg/dL (ref 8.4–10.5)
Chloride: 97 mEq/L (ref 96–112)
Creatinine, Ser: 0.45 mg/dL — ABNORMAL LOW (ref 0.50–1.10)
GFR calc Af Amer: >90 mL/min (ref >90)

## 2011-12-07 LAB — URINALYSIS, ROUTINE W REFLEX MICROSCOPIC
Bilirubin Urine: NEGATIVE
Hgb urine dipstick: NEGATIVE
Protein, ur: NEGATIVE mg/dL
Urobilinogen, UA: 0.2 mg/dL (ref 0.0–1.0)

## 2011-12-07 LAB — GLUCOSE, CAPILLARY: Glucose-Capillary: 270 mg/dL — ABNORMAL HIGH (ref 70–99)

## 2011-12-07 LAB — PREGNANCY, URINE: Preg Test, Ur: NEGATIVE

## 2011-12-07 MED ORDER — SODIUM CHLORIDE 0.9 % IV BOLUS (SEPSIS)
1000.0000 mL | Freq: Once | INTRAVENOUS | Status: AC
  Administered 2011-12-07: 1000 mL via INTRAVENOUS

## 2011-12-07 MED ORDER — DEXTROSE 5 % IV SOLN
1.0000 g | Freq: Once | INTRAVENOUS | Status: AC
  Administered 2011-12-07: 1 g via INTRAVENOUS
  Filled 2011-12-07: qty 10

## 2011-12-07 MED ORDER — SODIUM CHLORIDE 0.9 % IV SOLN
INTRAVENOUS | Status: DC
  Administered 2011-12-07: 125 mL/h via INTRAVENOUS

## 2011-12-07 MED ORDER — CEPHALEXIN 500 MG PO CAPS: 500.0000 mg | ORAL_CAPSULE | Freq: Four times a day (QID) | ORAL | Status: AC

## 2011-12-07 NOTE — ED Notes (Signed)
Reports history of abcess on kidney approximately two years ago- spent one week in hospital at [facility] per patient

## 2011-12-07 NOTE — ED Provider Notes (Signed)
History     CSN: [id number]  Arrival date & time 12/07/11  0142   First MD Initiated Contact with Patient 12/07/11 [phone]      Chief Complaint  Patient presents with  . Back Pain    (Consider location/radiation/quality/duration/timing/severity/associated sxs/prior treatment) HPI Comments: Traci Ellison is a 26 y.o. Female who complains of right lumbar pain for 10 days without injury. Tonight she notes, her sugar was greater than 500. She has removed her insulin pump since arriving to the emergency room. She denies fever, chills, nausea, vomiting, cough, shortness of breath, or chest pain. Is using her usual medications without relief. She has not seen her doctor recently.  Patient is a 26 y.o. female presenting with back pain. The history is provided by the patient.  Back Pain     Past Medical History  Diagnosis Date  . Diabetes mellitus     type 1  . Depression   . Anxiety     Past Surgical History  Procedure Date  . Induced abortion     No family history on file.  History  Substance Use Topics  . Smoking status: Current Everyday Smoker -- 1.0 packs/day  . Smokeless tobacco: Never Used  . Alcohol Use: No    OB History    Grav Para Term Preterm Abortions TAB SAB Ect Mult Living   4 1 1  3 1 2   1       Review of Systems  Musculoskeletal: Positive for back pain.  All other systems reviewed and are negative.    Allergies  Review of patient's allergies indicates no known allergies.  Home Medications   Current Outpatient Rx  Name Route Sig Dispense Refill  . INSULIN PUMP Subcutaneous Inject 1 each into the skin continuous. Pt uses humalog insulin in her pump    . INSULIN LISPRO (HUMAN) 100 UNIT/ML [place] SOLN Subcutaneous Inject into the skin 3 (three) times daily before meals. Per sliding scale    . CEPHALEXIN 500 MG PO CAPS Oral Take 1 capsule (500 mg total) by mouth 4 (four) times daily. 28 capsule 0  . NAPROXEN SODIUM 550 MG PO TABS Oral Take 1 tablet  (550 mg total) by mouth 2 (two) times daily with a meal. 30 tablet 0    BP 92/50  Pulse 62  Temp(Src) 97.7 F (36.5 C) (Oral)  Resp 18  Ht 5\' 2"  (1.575 m)  Wt 105 lb (47.628 kg)  BMI 19.20 kg/m2  SpO2 98%  LMP 11/27/2011  Physical Exam  Nursing note and vitals reviewed. Constitutional: She is oriented to person, place, and time. She appears well-developed and well-nourished.  HENT:  Head: Normocephalic and atraumatic.  Eyes: Conjunctivae and EOM are normal. Pupils are equal, round, and reactive to light.  Neck: Normal range of motion and phonation normal. Neck supple.  Cardiovascular: Normal rate, regular rhythm and intact distal pulses.   Pulmonary/Chest: Effort normal and breath sounds normal. No respiratory distress. She has no wheezes. She has no rales. She exhibits no tenderness.  Abdominal: Soft. She exhibits no distension. There is no tenderness. There is no guarding.       Normal appearing insulin injection site in right lower quadrant  Musculoskeletal: Normal range of motion.       Tender right lumbar paravertebral musculature. No deformity or swelling in the area.   Neurological: She is alert and oriented to person, place, and time. She has normal strength. She exhibits normal muscle tone.  Skin: Skin is  warm and dry.  Psychiatric: She has a normal mood and affect. Her behavior is normal. Judgment and thought content normal.    ED Course  Procedures (including critical care time) Emergency department treatment: IV fluids, bolus, and drip; IV Toradol for pain. Rocephin for UTI  5:48 AM Reevaluation with update and discussion. After initial assessment and treatment, an updated evaluation reveals she is resting comfortably. CBGs improved to 270. Forest [place]wine L      Labs Reviewed  URINALYSIS, ROUTINE W REFLEX MICROSCOPIC - Abnormal; Notable for the following:    Color, Urine STRAW (*)    APPearance HAZY (*)    Specific Gravity, Urine <1.005 (*)    Glucose, UA  >1000 (*)    All other components within normal limits  CBC - Abnormal; Notable for the following:    MCHC 36.1 (*)    All other components within normal limits  BASIC METABOLIC PANEL - Abnormal; Notable for the following:    Sodium 134 (*)    Glucose, Bld 413 (*)    Creatinine, Ser 0.45 (*)    All other components within normal limits  URINE MICROSCOPIC-ADD ON - Abnormal; Notable for the following:    Squamous Epithelial / LPF FEW (*)    Bacteria, UA FEW (*)    All other components within normal limits  GLUCOSE, CAPILLARY - Abnormal; Notable for the following:    Glucose-Capillary 270 (*)    All other components within normal limits  PREGNANCY, URINE  DIFFERENTIAL  URINE CULTURE   No results found.   1. UTI (lower urinary tract infection)   2. Hyperglycemia       MDM    Musculoskeletal low pain, doubt fracture or radiculopathy. Incidental urinary tract infection, along with hyperglycemia. She's been treated in the ED in improved, and stable for discharge. Doubt bacteremia, sepsis, serious bacterial   Pl an: Home Medications- Keflex   ;  Home Treatments-  Push fluids, restart insulin  ; Recommended follow up- PCP prn     Flint Melter, MD 12/07/11 [phone]

## 2011-12-07 NOTE — ED Notes (Signed)
IV remains patent

## 2011-12-07 NOTE — ED Notes (Signed)
Pt c/o lower back and right sided back pain for 1 1/2 weeks. Pain has gotten worse

## 2011-12-07 NOTE — ED Notes (Signed)
Has an insulin pump but turned it off this afternoon because it was not able to function properly with erratic blood sugars.  Last CBG check was at 23:00 and level was 548.    Has not used any insulin since pump discontinued.  Thinks she has a UTI which is causing her blood sugars to become out of control.

## 2011-12-07 NOTE — Discharge Instructions (Signed)
Hyperglycemia Hyperglycemia occurs when the glucose (sugar) in your blood is too high. Hyperglycemia can happen for many reasons, but it most often happens to people who do not know they have diabetes or are not managing their diabetes properly.  CAUSES  Whether you have diabetes or not, there are other causes of hyperglycemia. Hyperglycemia can occur when you have diabetes, but it can also occur in other situations that you might not be as aware of, such as: Diabetes  If you have diabetes and are having problems controlling your blood glucose, hyperglycemia could occur because of some of the following reasons:   Not following your meal plan.   Not taking your diabetes medications or not taking it properly.   Exercising less or doing less activity than you normally do.   Being sick.  Pre-diabetes  This cannot be ignored. Before people develop Type 2 diabetes, they almost always have "pre-diabetes." This is when your blood glucose levels are higher than normal, but not yet high enough to be diagnosed as diabetes. Research has shown that some long-term damage to the body, especially the heart and circulatory system, may already be occurring during pre-diabetes. If you take action to manage your blood glucose when you have pre-diabetes, you may delay or prevent Type 2 diabetes from developing.  Stress  If you have diabetes, you may be "diet" controlled or on oral medications or insulin to control your diabetes. However, you may find that your blood glucose is higher than usual in the hospital whether you have diabetes or not. This is often referred to as "stress hyperglycemia." Stress can elevate your blood glucose. This happens because of hormones put out by the body during times of stress. If stress has been the cause of your high blood glucose, it can be followed regularly by your caregiver. That way he/she can make sure your hyperglycemia does not continue to get worse or progress to diabetes.    Steroids  Steroids are medications that act on the infection fighting system (immune system) to block inflammation or infection. One side effect can be a rise in blood glucose. Most people can produce enough extra insulin to allow for this rise, but for those who cannot, steroids make blood glucose levels go even higher. It is not unusual for steroid treatments to "uncover" diabetes that is developing. It is not always possible to determine if the hyperglycemia will go away after the steroids are stopped. A special blood test called an A1c is sometimes done to determine if your blood glucose was elevated before the steroids were started.  SYMPTOMS  Thirsty.   Frequent urination.   Dry mouth.   Blurred vision.   Tired or fatigue.   Weakness.   Sleepy.   Tingling in feet or leg.  DIAGNOSIS  Diagnosis is made by monitoring blood glucose in one or all of the following ways:  A1c test. This is a chemical found in your blood.   Fingerstick blood glucose monitoring.   Laboratory results.  TREATMENT  First, knowing the cause of the hyperglycemia is important before the hyperglycemia can be treated. Treatment may include, but is not be limited to:  Education.   Change or adjustment in medications.   Change or adjustment in meal plan.   Treatment for an illness, infection, etc.   More frequent blood glucose monitoring.   Change in exercise plan.   Decreasing or stopping steroids.   Lifestyle changes.  HOME CARE INSTRUCTIONS   Test your blood glucose   as directed.   Exercise regularly. Your caregiver will give you instructions about exercise. Pre-diabetes or diabetes which comes on with stress is helped by exercising.   Eat wholesome, balanced meals. Eat often and at regular, fixed times. Your caregiver or nutritionist will give you a meal plan to guide your sugar intake.   Being at an ideal weight is important. If needed, losing as little as 10 to 15 pounds may help  improve blood glucose levels.  SEEK MEDICAL CARE IF:   You have questions about medicine, activity, or diet.   You continue to have symptoms (problems such as increased thirst, urination, or weight gain).  SEEK IMMEDIATE MEDICAL CARE IF:   You are vomiting or have diarrhea.   Your breath smells fruity.   You are breathing faster or slower.   You are very sleepy or incoherent.   You have numbness, tingling, or pain in your feet or hands.   You have chest pain.   Your symptoms get worse even though you have been following your caregiver's orders.   If you have any other questions or concerns.  Document Released: 01/16/2001 Document Revised: 07/12/2011 Document Reviewed: 03/14/2009 [employer] Patient Information 2012 [employer].Urinary Tract Infection Infections of the urinary tract can start in several places. A bladder infection (cystitis), a kidney infection (pyelonephritis), and a prostate infection (prostatitis) are different types of urinary tract infections (UTIs). They usually get better if treated with medicines (antibiotics) that kill germs. Take all the medicine until it is gone. You or your child may feel better in a few days, but TAKE ALL MEDICINE or the infection may not respond and may become more difficult to treat. HOME CARE INSTRUCTIONS   Drink enough water and fluids to keep the urine clear or pale yellow. Cranberry juice is especially recommended, in addition to large amounts of water.   Avoid caffeine, tea, and carbonated beverages. They tend to irritate the bladder.   Alcohol may irritate the prostate.   Only take over-the-counter or prescription medicines for pain, discomfort, or fever as directed by your caregiver.  To prevent further infections:  Empty the bladder often. Avoid holding urine for long periods of time.   After a bowel movement, women should cleanse from front to back. Use each tissue only once.   Empty the bladder before and after sexual  intercourse.  FINDING OUT THE RESULTS OF YOUR TEST Not all test results are available during your visit. If your or your child's test results are not back during the visit, make an appointment with your caregiver to find out the results. Do not assume everything is normal if you have not heard from your caregiver or the medical facility. It is important for you to follow up on all test results. SEEK MEDICAL CARE IF:   There is back pain.   Your baby is older than 3 months with a rectal temperature of 100.5 F (38.1 C) or higher for more than 1 day.   Your or your child's problems (symptoms) are no better in 3 days. Return sooner if you or your child is getting worse.  SEEK IMMEDIATE MEDICAL CARE IF:   There is severe back pain or lower abdominal pain.   You or your child develops chills.   You have a fever.   Your baby is older than 3 months with a rectal temperature of 102 F (38.9 C) or higher.   Your baby is 3 months old or younger with a rectal temperature of   100.4 F (38 C) or higher.   There is nausea or vomiting.   There is continued burning or discomfort with urination.  MAKE SURE YOU:   Understand these instructions.   Will watch your condition.   Will get help right away if you are not doing well or get worse.  Document Released: 05/02/2005 Document Revised: 07/12/2011 Document Reviewed: 12/05/2006 [employer] Patient Information 2012 [employer]. 

## 2011-12-08 LAB — URINE CULTURE
Colony Count: >=100000
Culture  Setup Time: 201305030255

## 2011-12-09 NOTE — ED Notes (Addendum)
+  Urine. Patient treated with Keflex. No sensitivity listed. Chart sent to EDP office for review.Keflex is appropriate.

## 2011-12-28 ENCOUNTER — Encounter (HOSPITAL_COMMUNITY): Payer: Self-pay | Admitting: Emergency Medicine

## 2011-12-28 ENCOUNTER — Emergency Department (HOSPITAL_COMMUNITY)
Admission: EM | Admit: 2011-12-28 | Discharge: 2011-12-28 | Disposition: A | Discharge status: Home / Self Care | Payer: BC Managed Care – PPO | Attending: Emergency Medicine | Admitting: Emergency Medicine

## 2011-12-28 DIAGNOSIS — E109 Type 1 diabetes mellitus without complications: Secondary | ICD-10-CM | POA: Insufficient documentation

## 2011-12-28 DIAGNOSIS — Z794 Long term (current) use of insulin: Secondary | ICD-10-CM | POA: Insufficient documentation

## 2011-12-28 DIAGNOSIS — R531 Weakness: Secondary | ICD-10-CM

## 2011-12-28 DIAGNOSIS — R5381 Other malaise: Secondary | ICD-10-CM | POA: Insufficient documentation

## 2011-12-28 DIAGNOSIS — F17200 Nicotine dependence, unspecified, uncomplicated: Secondary | ICD-10-CM | POA: Insufficient documentation

## 2011-12-28 DIAGNOSIS — R739 Hyperglycemia, unspecified: Secondary | ICD-10-CM

## 2011-12-28 LAB — CBC
HCT: 39.8 % (ref 36.0–46.0)
Hemoglobin: 14.1 g/dL (ref 12.0–15.0)
MCHC: 35.4 g/dL (ref 30.0–36.0)
RBC: 4.43 MIL/uL (ref 3.87–5.11)
WBC: 5.8 10*3/uL (ref 4.0–10.5)

## 2011-12-28 LAB — URINALYSIS, ROUTINE W REFLEX MICROSCOPIC
Glucose, UA: >1000 mg/dL — AB
Ketones, ur: NEGATIVE mg/dL
Protein, ur: NEGATIVE mg/dL

## 2011-12-28 LAB — GLUCOSE, CAPILLARY: Glucose-Capillary: 183 mg/dL — ABNORMAL HIGH (ref 70–99)

## 2011-12-28 LAB — BASIC METABOLIC PANEL
BUN: 13 mg/dL (ref 6–23)
CO2: 25 mEq/L (ref 19–32)
Chloride: 99 mEq/L (ref 96–112)
GFR calc Af Amer: >90 mL/min (ref >90)
Potassium: 4.4 mEq/L (ref 3.5–5.1)

## 2011-12-28 LAB — DIFFERENTIAL
Basophils Relative: 0 % (ref 0–1)
Lymphocytes Relative: 28 % (ref 12–46)
Monocytes Relative: 7 % (ref 3–12)
Neutro Abs: 3.7 10*3/uL (ref 1.7–7.7)
Neutrophils Relative %: 64 % (ref 43–77)

## 2011-12-28 LAB — URINE MICROSCOPIC-ADD ON

## 2011-12-28 MED ORDER — SODIUM CHLORIDE 0.9 % IV SOLN
Freq: Once | INTRAVENOUS | Status: AC
  Administered 2011-12-28: 13:00:00 via INTRAVENOUS

## 2011-12-28 MED ORDER — SODIUM CHLORIDE 0.9 % IV BOLUS (SEPSIS)
1000.0000 mL | Freq: Once | INTRAVENOUS | Status: AC
  Administered 2011-12-28: 1000 mL via INTRAVENOUS

## 2011-12-28 MED ORDER — INSULIN ASPART 100 UNIT/ML ~~LOC~~ SOLN
10.0000 [IU] | SUBCUTANEOUS | Status: AC
  Administered 2011-12-28: 10 [IU] via INTRAVENOUS
  Filled 2011-12-28: qty 1

## 2011-12-28 MED ORDER — SODIUM CHLORIDE 0.9 % IV SOLN
Freq: Once | INTRAVENOUS | Status: AC
  Administered 2011-12-28: 14:00:00 via INTRAVENOUS

## 2011-12-28 NOTE — Discharge Instructions (Signed)
Your urine did not show signs of infection today. However, when you have completed your antibiotic, you will need to have a culture done to make sure that the infection has actually cleared. Your blood sugar was very high today and that is the only thing I found that might have caused your weakness. Please monitor your blood sugars closely and adjust her insulin dose as needed.  Hyperglycemia Hyperglycemia occurs when the glucose (sugar) in your blood is too high. Hyperglycemia can happen for many reasons, but it most often happens to people who do not know they have diabetes or are not managing their diabetes properly.  CAUSES  Whether you have diabetes or not, there are other causes of hyperglycemia. Hyperglycemia can occur when you have diabetes, but it can also occur in other situations that you might not be as aware of, such as: Diabetes  If you have diabetes and are having problems controlling your blood glucose, hyperglycemia could occur because of some of the following reasons:   Not following your meal plan.   Not taking your diabetes medications or not taking it properly.   Exercising less or doing less activity than you normally do.   Being sick.  Pre-diabetes  This cannot be ignored. Before people develop Type 2 diabetes, they almost always have "pre-diabetes." This is when your blood glucose levels are higher than normal, but not yet high enough to be diagnosed as diabetes. Research has shown that some long-term damage to the body, especially the heart and circulatory system, may already be occurring during pre-diabetes. If you take action to manage your blood glucose when you have pre-diabetes, you may delay or prevent Type 2 diabetes from developing.  Stress  If you have diabetes, you may be "diet" controlled or on oral medications or insulin to control your diabetes. However, you may find that your blood glucose is higher than usual in the hospital whether you have diabetes or  not. This is often referred to as "stress hyperglycemia." Stress can elevate your blood glucose. This happens because of hormones put out by the body during times of stress. If stress has been the cause of your high blood glucose, it can be followed regularly by your caregiver. That way he/she can make sure your hyperglycemia does not continue to get worse or progress to diabetes.  Steroids  Steroids are medications that act on the infection fighting system (immune system) to block inflammation or infection. One side effect can be a rise in blood glucose. Most people can produce enough extra insulin to allow for this rise, but for those who cannot, steroids make blood glucose levels go even higher. It is not unusual for steroid treatments to "uncover" diabetes that is developing. It is not always possible to determine if the hyperglycemia will go away after the steroids are stopped. A special blood test called an A1c is sometimes done to determine if your blood glucose was elevated before the steroids were started.  SYMPTOMS  Thirsty.   Frequent urination.   Dry mouth.   Blurred vision.   Tired or fatigue.   Weakness.   Sleepy.   Tingling in feet or leg.  DIAGNOSIS  Diagnosis is made by monitoring blood glucose in one or all of the following ways:  A1c test. This is a chemical found in your blood.   Fingerstick blood glucose monitoring.   Laboratory results.  TREATMENT  First, knowing the cause of the hyperglycemia is important before the hyperglycemia can be treated.  Treatment may include, but is not be limited to:  Education.   Change or adjustment in medications.   Change or adjustment in meal plan.   Treatment for an illness, infection, etc.   More frequent blood glucose monitoring.   Change in exercise plan.   Decreasing or stopping steroids.   Lifestyle changes.  HOME CARE INSTRUCTIONS   Test your blood glucose as directed.   Exercise regularly. Your caregiver  will give you instructions about exercise. Pre-diabetes or diabetes which comes on with stress is helped by exercising.   Eat wholesome, balanced meals. Eat often and at regular, fixed times. Your caregiver or nutritionist will give you a meal plan to guide your sugar intake.   Being at an ideal weight is important. If needed, losing as little as 10 to 15 pounds may help improve blood glucose levels.  SEEK MEDICAL CARE IF:   You have questions about medicine, activity, or diet.   You continue to have symptoms (problems such as increased thirst, urination, or weight gain).  SEEK IMMEDIATE MEDICAL CARE IF:   You are vomiting or have diarrhea.   Your breath smells fruity.   You are breathing faster or slower.   You are very sleepy or incoherent.   You have numbness, tingling, or pain in your feet or hands.   You have chest pain.   Your symptoms get worse even though you have been following your caregiver's orders.   If you have any other questions or concerns.  Document Released: 01/16/2001 Document Revised: 07/12/2011 Document Reviewed: 03/14/2009 [facility] Patient Information 2012 [place], [place].  Weakness Your exam shows you have weakness without a known cause. This may require further medical evaluation. Weakness can be caused by infections, physical exhaustion, internal bleeding, dehydration, medication side effects, and emotional upsets. Problems with circulation, heart disease, and nervous system disorders can also make you weak. Weakness can cause dizziness and fainting; this is usually due to a low blood pressure. Full medical evaluation may require additional laboratory and x-ray tests, so be sure to see your doctor for follow up care as recommended. You should get plenty of rest and eat a nutritious diet over the next weeks. If you become very dizzy, nauseated, or feel like you are going to faint, please lie down flat right away and wait until all your symptoms have passed  before you get up again. You should see your doctor or go to the emergency room right away if you have any of the following problems:  Severe headache, chest pain, or abdominal pain.   An irregular heartbeat or a very fast pulse (over 120 beats/minute).   Chest pain, chest pressure, and/or difficulty breathing.   Confusion, vision problems, difficulty walking, fever or chills.  Document Released: 07/23/2005 Document Revised: 07/12/2011 Document Reviewed: 01/02/2007 [facility] Patient Information 2012 [place], [place].

## 2011-12-28 NOTE — ED Provider Notes (Signed)
This chart was scribed for Dione Booze, MD by Wallis Mart. The patient was seen in room APA05/APA05 and the patient's care was started at 1:21 PM.   CSN: [id number]  Arrival date & time 12/28/11  1246   First MD Initiated Contact with Patient 12/28/11 1318      Chief Complaint  Patient presents with  . Weakness  . Urinary Tract Infection    (Consider location/radiation/quality/duration/timing/severity/associated sxs/prior treatment) HPI  Traci Ellison is a 26 y.o. female who presents to the Emergency Department complaining of sudden onset, persistence of constant generalized weakness and dizziness after standing at work for the past hour. Pt denies LOC. Standing up and walking increase the dizziness. Lying down improves the diziness. Pt says her PCP diagnosed her with a kidney and bladder infection last week and pt took last antbx yesterday (Livoquin). Pt states that she went to her PCP this morning because she was still having pain and pressure.  Pt c/o chills, back pain that is similar to back pain she experienced w/ urniary infection. Pt has been having UTI's about every 2 weeks for the past year.  Denies d/n/v abd pain, fever.  Pt was seen in ED for UTI 3 weeks ago and given keflex.  There are no other associated symptoms and no other alleviating or aggravating factors.     PCP: Dr Christell Constant Past Medical History  Diagnosis Date  . Diabetes mellitus     type 1  . Depression   . Anxiety     Past Surgical History  Procedure Date  . Induced abortion     History reviewed. No pertinent family history.  History  Substance Use Topics  . Smoking status: Current Everyday Smoker -- 1.0 packs/day  . Smokeless tobacco: Never Used  . Alcohol Use: No    OB History    Grav Para Term Preterm Abortions TAB SAB Ect Mult Living   4 1 1  3 1 2   1       Review of Systems  10 Systems reviewed and all are negative for acute change except as noted in the HPI.    Allergies    Review of patient's allergies indicates no known allergies.  Home Medications   Current Outpatient Rx  Name Route Sig Dispense Refill  . INSULIN PUMP Subcutaneous Inject 1 each into the skin continuous. Pt uses humalog insulin in her pump    . INSULIN LISPRO (HUMAN) 100 UNIT/ML [place] SOLN Subcutaneous Inject into the skin 3 (three) times daily before meals. Per sliding scale    . NAPROXEN SODIUM 550 MG PO TABS Oral Take 1 tablet (550 mg total) by mouth 2 (two) times daily with a meal. 30 tablet 0    BP 118/70  Pulse 94  Temp 97.9 F (36.6 C)  Resp 18  Ht 5\' 2"  (1.575 m)  Wt 105 lb (47.628 kg)  BMI 19.20 kg/m2  SpO2 97%  LMP 12/28/2011  Physical Exam  Nursing note and vitals reviewed. Constitutional: She is oriented to person, place, and time. She appears well-developed and well-nourished. No distress.  HENT:  Head: Normocephalic and atraumatic.  Eyes: EOM are normal. Pupils are equal, round, and reactive to light.  Neck: Neck supple. No tracheal deviation present.  Cardiovascular: Normal rate and regular rhythm.   Pulmonary/Chest: Effort normal and breath sounds normal. No respiratory distress.  Abdominal: Soft. She exhibits no distension. There is tenderness.       Mild suprapubic tenderness, no CVA tenderness  Musculoskeletal: Normal range of motion. She exhibits no edema.  Neurological: She is alert and oriented to person, place, and time. No sensory deficit.  Skin: Skin is warm and dry.  Psychiatric: She has a normal mood and affect. Her behavior is normal.    ED Course  Procedures (including critical care time)  DIAGNOSTIC STUDIES: Oxygen Saturation is 97% on room air, normal by my interpretation.    COORDINATION OF CARE:   Results for orders placed during the hospital encounter of 12/28/11  CBC      Component Value Range   WBC 5.8  4.0 - 10.5 (K/uL)   RBC 4.43  3.87 - 5.11 (MIL/uL)   Hemoglobin 14.1  12.0 - 15.0 (g/dL)   HCT 16.1  09.6 - 04.5 (%)   MCV  89.8  78.0 - 100.0 (fL)   MCH 31.8  26.0 - 34.0 (pg)   MCHC 35.4  30.0 - 36.0 (g/dL)   RDW 40.9  81.1 - 91.4 (%)   Platelets 234  150 - 400 (K/uL)  DIFFERENTIAL      Component Value Range   Neutrophils Relative 64  43 - 77 (%)   Neutro Abs 3.7  1.7 - 7.7 (K/uL)   Lymphocytes Relative 28  12 - 46 (%)   Lymphs Abs 1.7  0.7 - 4.0 (K/uL)   Monocytes Relative 7  3 - 12 (%)   Monocytes Absolute 0.4  0.1 - 1.0 (K/uL)   Eosinophils Relative 1  0 - 5 (%)   Eosinophils Absolute 0.1  0.0 - 0.7 (K/uL)   Basophils Relative 0  0 - 1 (%)   Basophils Absolute 0.0  0.0 - 0.1 (K/uL)  BASIC METABOLIC PANEL      Component Value Range   Sodium 133 (*) 135 - 145 (mEq/L)   Potassium 4.4  3.5 - 5.1 (mEq/L)   Chloride 99  96 - 112 (mEq/L)   CO2 25  19 - 32 (mEq/L)   Glucose, Bld 538 (*) 70 - 99 (mg/dL)   BUN 13  6 - 23 (mg/dL)   Creatinine, Ser 7.82  0.50 - 1.10 (mg/dL)   Calcium 9.2  8.4 - 95.6 (mg/dL)   GFR calc non Af Amer >90  >90 (mL/min)   GFR calc Af Amer >90  >90 (mL/min)  URINALYSIS, ROUTINE W REFLEX MICROSCOPIC      Component Value Range   Color, Urine YELLOW  YELLOW    APPearance CLEAR  CLEAR    Specific Gravity, Urine <1.005 (*) 1.005 - 1.030    pH 6.5  5.0 - 8.0    Glucose, UA >1000 (*) NEGATIVE (mg/dL)   Hgb urine dipstick LARGE (*) NEGATIVE    Bilirubin Urine NEGATIVE  NEGATIVE    Ketones, ur NEGATIVE  NEGATIVE (mg/dL)   Protein, ur NEGATIVE  NEGATIVE (mg/dL)   Urobilinogen, UA 0.2  0.0 - 1.0 (mg/dL)   Nitrite NEGATIVE  NEGATIVE    Leukocytes, UA NEGATIVE  NEGATIVE   URINE MICROSCOPIC-ADD ON      Component Value Range   Squamous Epithelial / LPF FEW (*) RARE    WBC, UA 0-2  <3 (WBC/hpf)   RBC / HPF 21-50  <3 (RBC/hpf)   Bacteria, UA RARE  RARE   GLUCOSE, CAPILLARY      Component Value Range   Glucose-Capillary 183 (*) 70 - 99 (mg/dL)   Comment 1 Documented in Chart     Comment 2 Notify RN  1. Weakness   2. Hyperglycemia       MDM  Weakness which may be due  to urinary tract infection.  Urinalysis does not show evidence of UTI. However, and she is noted to be very hyperglycemic. She's given IV fluid bolus and IV insulin and blood sugars come down to a reasonable level. Her hyperglycemia may be what was causing her weakness.  I personally performed the services described in this documentation, which was scribed in my presence. The recorded information has been reviewed and considered.         Dione Booze, MD 12/28/11 [phone]

## 2011-12-28 NOTE — ED Notes (Signed)
Pt c/o generalized weakness/dizziness upon standing x 1 hour. Pt currently being treated for uti/kidney infection.

## 2011-12-28 NOTE — ED Notes (Signed)
Pt states she has a insulin pump that she took out yesterday " because I wanted to "

## 2011-12-30 LAB — URINE CULTURE
Colony Count: NO GROWTH
Culture  Setup Time: 201305250233
Culture: NO GROWTH

## 2012-04-28 ENCOUNTER — Encounter (HOSPITAL_BASED_OUTPATIENT_CLINIC_OR_DEPARTMENT_OTHER): Payer: Self-pay | Admitting: *Deleted

## 2012-04-28 ENCOUNTER — Encounter (HOSPITAL_BASED_OUTPATIENT_CLINIC_OR_DEPARTMENT_OTHER)
Admission: RE | Admit: 2012-04-28 | Discharge: 2012-04-28 | Disposition: A | Discharge status: Home / Self Care | Payer: Medicaid Other | Source: Ambulatory Visit | Attending: Surgery | Admitting: Surgery

## 2012-04-28 ENCOUNTER — Encounter (INDEPENDENT_AMBULATORY_CARE_PROVIDER_SITE_OTHER): Payer: Self-pay | Admitting: Surgery

## 2012-04-28 ENCOUNTER — Ambulatory Visit (INDEPENDENT_AMBULATORY_CARE_PROVIDER_SITE_OTHER): Payer: Medicaid Other | Admitting: Surgery

## 2012-04-28 VITALS — BP 96/66 | HR 103 | Temp 98.4°F | Ht 62.0 in | Wt 105.4 lb

## 2012-04-28 DIAGNOSIS — L723 Sebaceous cyst: Secondary | ICD-10-CM | POA: Insufficient documentation

## 2012-04-28 DIAGNOSIS — F329 Major depressive disorder, single episode, unspecified: Secondary | ICD-10-CM | POA: Insufficient documentation

## 2012-04-28 DIAGNOSIS — O99330 Smoking (tobacco) complicating pregnancy, unspecified trimester: Secondary | ICD-10-CM | POA: Insufficient documentation

## 2012-04-28 DIAGNOSIS — F3289 Other specified depressive episodes: Secondary | ICD-10-CM | POA: Insufficient documentation

## 2012-04-28 DIAGNOSIS — Z8249 Family history of ischemic heart disease and other diseases of the circulatory system: Secondary | ICD-10-CM | POA: Insufficient documentation

## 2012-04-28 DIAGNOSIS — F411 Generalized anxiety disorder: Secondary | ICD-10-CM | POA: Insufficient documentation

## 2012-04-28 DIAGNOSIS — Z833 Family history of diabetes mellitus: Secondary | ICD-10-CM | POA: Insufficient documentation

## 2012-04-28 DIAGNOSIS — O24919 Unspecified diabetes mellitus in pregnancy, unspecified trimester: Secondary | ICD-10-CM | POA: Insufficient documentation

## 2012-04-28 DIAGNOSIS — E109 Type 1 diabetes mellitus without complications: Secondary | ICD-10-CM | POA: Insufficient documentation

## 2012-04-28 LAB — POCT I-STAT, CHEM 8
HCT: 40 % (ref 36.0–46.0)
Hemoglobin: 13.6 g/dL (ref 12.0–15.0)
Sodium: 140 mEq/L (ref 135–145)
TCO2: 23 mmol/L (ref 0–100)

## 2012-04-28 NOTE — Progress Notes (Signed)
Patient ID: Traci Ellison, female   DOB: 12-16-1985, 26 y.o.   MRN: [id number]  Chief Complaint  Patient presents with  . Pre-op Exam    left chest wall mass    HPI Traci Ellison is a 26 y.o. female.   HPI This is a pleasant female referred by Arabella Merles, PA  For evaluation of a left chest wall mass to the shoulder. She reports that started several years ago is a small nodule but now has a increase in size in the last several weeks and has become increasingly painful. It is also occasionally drained. She currently denies fevers and has been able to keep her sugars under control. She denies fever. She denies recent weight loss or weight gain Past Medical History  Diagnosis Date  . Diabetes mellitus     type 1  . Depression   . Anxiety     Past Surgical History  Procedure Date  . Induced abortion     Family History  Problem Relation Age of Onset  . Diabetes Mother   . Heart disease Mother     Social History History  Substance Use Topics  . Smoking status: Current Every Day Smoker -- 1.0 packs/day    Types: Cigarettes  . Smokeless tobacco: Never Used  . Alcohol Use: No    Allergies  Allergen Reactions  . Diclofenac Hives and Itching    Current Outpatient Prescriptions  Medication Sig Dispense Refill  . Insulin Human (INSULIN PUMP) 100 unit/ml SOLN Inject 1 each into the skin continuous. Pt uses humalog insulin in her pump      . insulin lispro (HUMALOG) 100 UNIT/ML injection Inject into the skin 3 (three) times daily before meals. Per sliding scale      . DISCONTD: citalopram (CELEXA) 10 MG tablet Take 10-20 mg by mouth daily as needed. For mood stabilization      . DISCONTD: divalproex (DEPAKOTE ER) 500 MG 24 hr tablet Take 1,000 mg by mouth at bedtime.      Marland Kitchen DISCONTD: insulin detemir (LEVEMIR) 100 UNIT/ML injection Inject 30 Units into the skin daily.        Marland Kitchen DISCONTD: Norethindrone-Ethinyl Estradiol-Fe Biphas (LO LOESTRIN FE) 1 MG-10 MCG / 10 MCG tablet Take 1  tablet by mouth daily.        Review of Systems Review of Systems  Constitutional: Negative for fever, chills and unexpected weight change.  HENT: Negative for hearing loss, congestion, sore throat, trouble swallowing and voice change.   Eyes: Negative for visual disturbance.  Respiratory: Negative for cough and wheezing.   Cardiovascular: Negative for chest pain, palpitations and leg swelling.  Gastrointestinal: Negative for nausea, vomiting, abdominal pain, diarrhea, constipation, blood in stool, abdominal distention and anal bleeding.  Genitourinary: Negative for hematuria, vaginal bleeding and difficulty urinating.  Musculoskeletal: Negative for arthralgias.  Skin: Negative for rash and wound.  Neurological: Negative for seizures, syncope and headaches.  Hematological: Negative for adenopathy. Does not bruise/bleed easily.  Psychiatric/Behavioral: Negative for confusion.    Blood pressure 96/66, pulse 103, temperature 98.4 F (36.9 C), temperature source Temporal, height 5\' 2"  (1.575 m), weight 105 lb 6.4 oz (47.809 kg), SpO2 98.00%.  Physical Exam Physical Exam  Constitutional: She is oriented to person, place, and time. She appears well-developed and well-nourished. No distress.  HENT:  Head: Normocephalic and atraumatic.  Right Ear: External ear normal.  Left Ear: External ear normal.  Nose: Nose normal.  Mouth/Throat: Oropharynx is clear and moist. No oropharyngeal  exudate.  Eyes: Conjunctivae normal are normal. Pupils are equal, round, and reactive to light. Right eye exhibits no discharge. Left eye exhibits no discharge. No scleral icterus.  Neck: Normal range of motion. Neck supple. No tracheal deviation present. No thyromegaly present.  Cardiovascular: Normal rate, regular rhythm, normal heart sounds and intact distal pulses.   No murmur heard. Pulmonary/Chest: Effort normal and breath sounds normal. No respiratory distress. She has no wheezes. She has no rales.        There is a 2 cm superficial mass on the left chest wall just below the clavicle. It is soft and mobile with slight erythema  Abdominal: Soft. Bowel sounds are normal. She exhibits no distension. There is no tenderness. There is no rebound.  Musculoskeletal: Normal range of motion. She exhibits no edema and no tenderness.  Lymphadenopathy:    She has no cervical adenopathy.  Neurological: She is alert and oriented to person, place, and time.  Skin: Skin is warm and dry. No rash noted. She is not diaphoretic. There is erythema. No pallor.  Psychiatric: Her behavior is normal. Judgment normal.    Data Reviewed   Assessment    2 cm left chest wall mass    Plan    I suspect this may represent a chronically infected sebaceous cyst. Given the location of the chest wall near the breast and her diabetes, removal is recommended for histological evaluation to rule out malignancy and to prevent further infection. I discussed this with her in detail. I discussed the risks of surgery which includes but is not limited to bleeding, infection, need for further surgery, recurrence, etc. She understands and wishes to proceed. Surgery will be scheduled. Likelihood of success is good       [name] A 04/28/2012, 2:28 PM

## 2012-04-28 NOTE — Progress Notes (Signed)
ekg cleared by dr hodiern 

## 2012-04-28 NOTE — Progress Notes (Signed)
Had BMET, Urine pregnancy and EKG done while she was here. Pt has been diabetic since age 26.

## 2012-04-29 LAB — PREGNANCY, URINE: Preg Test, Ur: POSITIVE — AB

## 2012-04-29 NOTE — Progress Notes (Signed)
Urine preg test had not been ran by lab  Lab states has no order, but order released here ,  Fax order to lab.

## 2012-04-29 NOTE — Progress Notes (Signed)
Abnormal labs cleared by Dr Krista Blue

## 2012-04-30 ENCOUNTER — Ambulatory Visit (HOSPITAL_BASED_OUTPATIENT_CLINIC_OR_DEPARTMENT_OTHER): Admission: RE | Admit: 2012-04-30 | Payer: BC Managed Care – PPO | Source: Ambulatory Visit | Admitting: Surgery

## 2012-04-30 SURGERY — EXCISION MASS
Anesthesia: Monitor Anesthesia Care | Site: Chest | Laterality: Left

## 2012-05-06 ENCOUNTER — Other Ambulatory Visit: Payer: Self-pay

## 2012-05-08 ENCOUNTER — Encounter (HOSPITAL_COMMUNITY): Payer: Self-pay | Admitting: Unknown Physician Specialty

## 2012-05-08 ENCOUNTER — Ambulatory Visit (HOSPITAL_COMMUNITY): Payer: Medicaid Other

## 2012-05-08 ENCOUNTER — Encounter (HOSPITAL_COMMUNITY): Payer: Self-pay

## 2012-05-13 ENCOUNTER — Ambulatory Visit (HOSPITAL_COMMUNITY)
Admission: RE | Admit: 2012-05-13 | Discharge: 2012-05-13 | Disposition: A | Discharge status: Home / Self Care | Payer: Medicaid Other | Source: Ambulatory Visit | Attending: Unknown Physician Specialty | Admitting: Unknown Physician Specialty

## 2012-05-13 ENCOUNTER — Encounter (HOSPITAL_COMMUNITY): Payer: Self-pay

## 2012-05-13 VITALS — BP 104/53 | HR 93 | Wt 108.2 lb

## 2012-05-13 DIAGNOSIS — E109 Type 1 diabetes mellitus without complications: Secondary | ICD-10-CM | POA: Insufficient documentation

## 2012-05-13 DIAGNOSIS — O24919 Unspecified diabetes mellitus in pregnancy, unspecified trimester: Secondary | ICD-10-CM | POA: Insufficient documentation

## 2012-05-13 DIAGNOSIS — O24011 Pre-existing diabetes mellitus, type 1, in pregnancy, first trimester: Secondary | ICD-10-CM | POA: Insufficient documentation

## 2012-05-13 DIAGNOSIS — O99891 Other specified diseases and conditions complicating pregnancy: Secondary | ICD-10-CM | POA: Insufficient documentation

## 2012-05-13 DIAGNOSIS — L723 Sebaceous cyst: Secondary | ICD-10-CM | POA: Insufficient documentation

## 2012-05-13 NOTE — Progress Notes (Signed)
MFM consult  26 yr old [id number] at [redacted]w[redacted]d with type I diabetes referred by Dr. Ralph Dowdy for consult.  Past medical history: Type I diabetes- diagnosed age 4; per patient no kidney, eye, or heart disease Shoulder cyst  Past surgical history: none Medications: insulin pump; currently at a basal rate of 0.95; uses bolus wizard for meal boluses Allergies: diclofenac- hives Social: +1ppd tobacco, no alcohol or illicit drug use Family history: +diabetes and heart disease; no history of mental retardation or birth defects  I counseled the patient as follows:  1. Type I diabetes in pregnancy:  Discussed increased risks in pregnancy include: miscarriage, fetal macrosomia, shoulder dystocia, and increased risk of requiring a Cesarean delivery. There is also an increased risk of developing preeclampsia during the pregnancy. I discussed there is an increased risk of stillbirth, neonatal hypoglycemia, neonatal jaundice, and neonatal electrolyte disturbances. There is an increased risk of congenital malformations related to level of diabetic control in the first trimester. Discussed that recent hemoglobin A1C was 11.8; this is indicative of poor recent glycemic control. Discussed with this poor level of control at the time of conception and in the first 8 weeks of pregnancy significantly increases the risk of congenital malformations.  Given the increased risk of congenital anomalies recommend fetal anatomic survey at 18-[redacted] weeks gestation and recommend fetal echocardiogram at [redacted] weeks gestation.  I recommend strict glucose control maintaining fasting blood sugars <90 and 2 hour postprandial values <120. Patient is being referred to Endocrinology to manage her blood sugars. She has an appointment tomorrow. We are happy to aid in achieving glycemic control if desired. Patient is currently on an insulin pump and has not changed the setting since March. Discussed her pump settings would need to be adjusted as she  currently has poor glycemic control. Would also recommend that she obtain fasting, pre meals, 1 or 2 hour post meals, and qhs blood sugars to aid in insulin management. I discussed another option given her poor glycemic control is for hospitalization to achieve glycemic control and optimization. At this point patient does not desire admission. Emphasized the importance of good glycemic control in pregnancy to help decrease risks of adverse outcomes. Blood sugars should ideally be reviewed at least once a week or more frequently as needed and insulin adjusted accordingly to achieve values above. Discussed risk of hypoglycemia as well as this risk is increased in type I diabetics. Discussed risk of diabetic ketoacidosis with poor glycemic control.   I recommend starting fetal kick counts- at [redacted] weeks gestation. I recommend starting antenatal testing with either weekly biophysical profiles or twice weekly nonstress tests and weekly amniotic fluid index starting at 32 weeks.  I recommend following fetal growth every 4 weeks starting at [redacted] weeks gestation. I recommend delivery by estimated due date but not prior to 39 weeks in the absence of other complications. I recommend obtain baseline CBC (performed and normal), AST, ALT, BUN, creatinine, uric acid, 24 hour urine total protein, echocardiogram, and EKG. Patient had normal TSH. The patient should have an Ophthamology exam if not recently performed; patient reports she had an exam in March- would recommend a repeat exam in pregnancy. Recommend check hemoglobin A1C every trimester.  Spoke with Dr. Ralph Dowdy. We are happy to comanage the patient with your practice.   2. Tobacco use: - discussed increased risks of fetal growth restriction, placental abruption, preterm delivery, sudden infant death syndrome, and childhood asthma. - patient reports she is not interested in quitting or further  decreasing use at this time - advised patient if she were to become  interested we can offer nicotine patch or wellbutrin to aid in cessation  3. Discussed first trimester screen for aneuploidy screening. Discussed limitations in detecting fetal aneuploidy. Patient desires the first trimester screen and this was scheduled in our office for 6 weeks.  4. Shoulder cyst: has been evaluated for this cyst. Surgery for removal was cancelled secondary to pregnancy. If removal is recommended it can be performed the safest in the second trimester. If a biopsy is recommended this can be done safely with local anesthesia. Patient reports current evaluation is on hold. We are happy to consult with the surgeons for a specific plan.  5. Upon review of the patient's chart I see a history of herpes. This was not addressed with the patient today. There is a low risk of antenatal transmission. Risk is highest at the time of delivery especially with recent outbreak. Recommend delivery via C section if symptoms of outbreak or lesions within the previous 10-14 days otherwise can deliver vaginally. Can offer prophylaxis with acyclovir or valacyclovir starting at 36 weeks or throughout the pregnancy especially if patient has frequent outbreaks.  I spent 45 minutes in face to face consultation with the patient.  Eulis Foster, MD

## 2012-05-14 NOTE — Addendum Note (Signed)
Encounter addended by: Duanne Moron, RN on: 05/14/2012 10:03 AM<BR>     Documentation filed: Charges VN

## 2012-05-15 ENCOUNTER — Encounter (INDEPENDENT_AMBULATORY_CARE_PROVIDER_SITE_OTHER): Payer: Medicaid Other | Admitting: Surgery

## 2012-06-24 ENCOUNTER — Other Ambulatory Visit (HOSPITAL_COMMUNITY): Payer: Self-pay | Admitting: Unknown Physician Specialty

## 2012-06-24 DIAGNOSIS — Z3682 Encounter for antenatal screening for nuchal translucency: Secondary | ICD-10-CM

## 2012-06-26 ENCOUNTER — Ambulatory Visit (HOSPITAL_COMMUNITY): Payer: Medicaid Other

## 2012-08-07 ENCOUNTER — Other Ambulatory Visit (HOSPITAL_COMMUNITY): Payer: Self-pay | Admitting: Family Medicine

## 2012-08-07 DIAGNOSIS — O24919 Unspecified diabetes mellitus in pregnancy, unspecified trimester: Secondary | ICD-10-CM

## 2012-08-07 DIAGNOSIS — Z3689 Encounter for other specified antenatal screening: Secondary | ICD-10-CM

## 2012-08-12 ENCOUNTER — Ambulatory Visit (HOSPITAL_COMMUNITY)
Admission: RE | Admit: 2012-08-12 | Discharge: 2012-08-12 | Disposition: A | Discharge status: Home / Self Care | Payer: Medicaid Other | Source: Ambulatory Visit | Attending: Unknown Physician Specialty | Admitting: Unknown Physician Specialty

## 2012-08-12 ENCOUNTER — Encounter (HOSPITAL_COMMUNITY): Payer: Self-pay

## 2012-08-12 VITALS — BP 107/56 | HR 95 | Wt 129.2 lb

## 2012-08-12 DIAGNOSIS — O24919 Unspecified diabetes mellitus in pregnancy, unspecified trimester: Secondary | ICD-10-CM | POA: Insufficient documentation

## 2012-08-12 DIAGNOSIS — Z3689 Encounter for other specified antenatal screening: Secondary | ICD-10-CM

## 2012-08-12 DIAGNOSIS — Z1389 Encounter for screening for other disorder: Secondary | ICD-10-CM | POA: Insufficient documentation

## 2012-08-12 DIAGNOSIS — Z363 Encounter for antenatal screening for malformations: Secondary | ICD-10-CM | POA: Insufficient documentation

## 2012-08-12 DIAGNOSIS — O24011 Pre-existing diabetes mellitus, type 1, in pregnancy, first trimester: Secondary | ICD-10-CM

## 2012-08-12 DIAGNOSIS — O358XX0 Maternal care for other (suspected) fetal abnormality and damage, not applicable or unspecified: Secondary | ICD-10-CM | POA: Insufficient documentation

## 2012-08-12 DIAGNOSIS — O99330 Smoking (tobacco) complicating pregnancy, unspecified trimester: Secondary | ICD-10-CM | POA: Insufficient documentation

## 2012-08-27 ENCOUNTER — Encounter: Payer: Self-pay | Admitting: Unknown Physician Specialty

## 2013-02-12 ENCOUNTER — Telehealth: Payer: Self-pay

## 2013-02-12 DIAGNOSIS — L723 Sebaceous cyst: Secondary | ICD-10-CM

## 2013-02-12 NOTE — Telephone Encounter (Signed)
Was given a referral previously for [facility]CS to remove cyst from shoulder and became pregnant so never had it done  Wants another referral now

## 2013-03-18 ENCOUNTER — Encounter (HOSPITAL_COMMUNITY): Payer: Self-pay | Admitting: *Deleted

## 2013-03-23 ENCOUNTER — Ambulatory Visit (INDEPENDENT_AMBULATORY_CARE_PROVIDER_SITE_OTHER): Payer: Medicaid Other | Admitting: Surgery

## 2013-03-23 ENCOUNTER — Encounter (INDEPENDENT_AMBULATORY_CARE_PROVIDER_SITE_OTHER): Payer: Self-pay | Admitting: Surgery

## 2013-03-23 VITALS — BP 110/68 | HR 72 | Resp 16 | Ht 62.0 in | Wt 104.8 lb

## 2013-03-23 DIAGNOSIS — L723 Sebaceous cyst: Secondary | ICD-10-CM

## 2013-03-23 NOTE — Progress Notes (Signed)
Subjective:     Patient ID: Traci Ellison, female   DOB: 21-Mar-1986, 27 y.o.   MRN: [id number]  HPI This is a patient who I saw in September for a chronically infected sebaceous cyst on her left chest. Surgery was scheduled but it had to be canceled and she was found to be pregnant. She has now delivered and is interested in cyst removal. She's been doing well  Review of Systems     Objective:   Physical Exam The palpable sebaceous cyst is still present in the left upper chest just below the clavicle    Assessment:     Chronic sebaceous cyst     Plan:     We will now schedule her for removal in the operating room. Risks were discussed

## 2013-03-24 NOTE — Progress Notes (Signed)
Need orders please - pt coming for preop TUES 8/26 - Thank you

## 2013-03-27 ENCOUNTER — Encounter (HOSPITAL_COMMUNITY): Payer: Self-pay | Admitting: Pharmacy Technician

## 2013-03-31 ENCOUNTER — Other Ambulatory Visit (INDEPENDENT_AMBULATORY_CARE_PROVIDER_SITE_OTHER): Payer: Self-pay | Admitting: Surgery

## 2013-03-31 ENCOUNTER — Encounter (HOSPITAL_COMMUNITY): Payer: Self-pay

## 2013-03-31 ENCOUNTER — Encounter (HOSPITAL_COMMUNITY)
Admission: RE | Admit: 2013-03-31 | Discharge: 2013-03-31 | Disposition: A | Discharge status: Home / Self Care | Payer: Medicaid Other | Source: Ambulatory Visit | Attending: Surgery | Admitting: Surgery

## 2013-03-31 HISTORY — DX: Headache: R51

## 2013-03-31 HISTORY — DX: Personal history of urinary calculi: Z87.442

## 2013-03-31 LAB — BASIC METABOLIC PANEL
BUN: 9 mg/dL (ref 6–23)
CO2: 27 mEq/L (ref 19–32)
Chloride: 105 mEq/L (ref 96–112)
Creatinine, Ser: 0.56 mg/dL (ref 0.50–1.10)
Glucose, Bld: 61 mg/dL — ABNORMAL LOW (ref 70–99)

## 2013-03-31 LAB — CBC
HCT: 42 % (ref 36.0–46.0)
Hemoglobin: 14.4 g/dL (ref 12.0–15.0)
MCV: 91.3 fL (ref 78.0–100.0)
Platelets: 343 10*3/uL (ref 150–400)
RBC: 4.6 MIL/uL (ref 3.87–5.11)
WBC: 8.2 10*3/uL (ref 4.0–10.5)

## 2013-03-31 LAB — HCG, SERUM, QUALITATIVE: Preg, Serum: NEGATIVE

## 2013-03-31 NOTE — Pre-Procedure Instructions (Signed)
03-31-13 patient aware to keep insulin pump on basal rate. Diabetes coordinator "Bjorn Loser" made aware of procedure date/time-procedure to be less than 1 hour. Pt. Has signed insulin pump contract. W. Fernie Grimm,RN 03-31-13 1330 Call to patient due to glucose -61 with preop lab work-voice message lef t to have patient return call.W. Kennon Portela

## 2013-03-31 NOTE — Patient Instructions (Addendum)
20 Traci Ellison  03/31/2013   Your procedure is scheduled on:  8-29 -2014  Report to [facility] at       0700 AM .  Call this number if you have problems the morning of surgery: [phone]  Or Presurgical Testing [phone](Addelynn Batte)     Do not eat food:After Midnight.    Take these medicines the morning of surgery with A SIP OF WATER: None. Leave Insulin Pump at basal rate per MD instructions. Bring pump supplies. Have MD fax instructions to Presurgical testing [facility] [phone].   Do not wear jewelry, make-up or nail polish.  Do not wear lotions, powders, or perfumes. You may wear deodorant.  Do not shave 12 hours prior to first CHG shower(legs and under arms).(face and neck okay.)  Do not bring valuables to the hospital.  Contacts, dentures or bridgework,body piercing,  may not be worn into surgery.  Leave suitcase in the car. After surgery it may be brought to your room.  For patients admitted to the hospital, checkout time is 11:00 AM the day of discharge.   Patients discharged the day of surgery will not be allowed to drive home. Must have responsible person with you x 24 hours once discharged.  Name and phone number of your driver: "[employer]" -ant [phone] cell  Special Instructions: CHG(Chlorhedine 4%-"Hibiclens","Betasept","Aplicare") Shower Use Special Wash: see special instructions.(avoid face and genitals)       Failure to follow these instructions may result in Cancellation of your surgery.   Patient signature_______________________________________________________

## 2013-03-31 NOTE — Progress Notes (Signed)
03-31-13 0930 Pt. Here for preop visit ,need MD order entry in Epic, will do labs per anesthesia guidelines. W. Kennon Portela

## 2013-04-01 NOTE — H&P (Signed)
Patient ID: Traci Ellison, female DOB: 1985/12/06, 27 y.o. MRN: [id number]  Chief Complaint   Patient presents with   .  Pre-op Exam     left chest wall mass   HPI  Traci Ellison is a 27 y.o. female.  HPI  This is a pleasant female referred by Arabella Merles, PA For evaluation of a left chest wall mass to the shoulder. She reports that started several years ago is a small nodule but now has a increase in size in the last several weeks and has become increasingly painful. It is also occasionally drained. She currently denies fevers and has been able to keep her sugars under control. She denies fever. She denies recent weight loss or weight gain  Past Medical History   Diagnosis  Date   .  Diabetes mellitus      type 1   .  Depression    .  Anxiety     Past Surgical History   Procedure  Date   .  Induced abortion     Family History   Problem  Relation  Age of Onset   .  Diabetes  Mother    .  Heart disease  Mother    Social History  History   Substance Use Topics   .  Smoking status:  Current Every Day Smoker -- 1.0 packs/day     Types:  Cigarettes   .  Smokeless tobacco:  Never Used   .  Alcohol Use:  No    Allergies   Allergen  Reactions   .  Diclofenac  Hives and Itching    Current Outpatient Prescriptions   Medication  Sig  Dispense  Refill   .  Insulin Human (INSULIN PUMP) 100 unit/ml SOLN  Inject 1 each into the skin continuous. Pt uses humalog insulin in her pump     .  insulin lispro (HUMALOG) 100 UNIT/ML injection  Inject into the skin 3 (three) times daily before meals. Per sliding scale     .  DISCONTD: citalopram (CELEXA) 10 MG tablet  Take 10-20 mg by mouth daily as needed. For mood stabilization     .  DISCONTD: divalproex (DEPAKOTE ER) 500 MG 24 hr tablet  Take 1,000 mg by mouth at bedtime.     Marland Kitchen  DISCONTD: insulin detemir (LEVEMIR) 100 UNIT/ML injection  Inject 30 Units into the skin daily.     Marland Kitchen  DISCONTD: Norethindrone-Ethinyl Estradiol-Fe Biphas (LO  LOESTRIN FE) 1 MG-10 MCG / 10 MCG tablet  Take 1 tablet by mouth daily.     Review of Systems  Review of Systems  Constitutional: Negative for fever, chills and unexpected weight change.  HENT: Negative for hearing loss, congestion, sore throat, trouble swallowing and voice change.  Eyes: Negative for visual disturbance.  Respiratory: Negative for cough and wheezing.  Cardiovascular: Negative for chest pain, palpitations and leg swelling.  Gastrointestinal: Negative for nausea, vomiting, abdominal pain, diarrhea, constipation, blood in stool, abdominal distention and anal bleeding.  Genitourinary: Negative for hematuria, vaginal bleeding and difficulty urinating.  Musculoskeletal: Negative for arthralgias.  Skin: Negative for rash and wound.  Neurological: Negative for seizures, syncope and headaches.  Hematological: Negative for adenopathy. Does not bruise/bleed easily.  Psychiatric/Behavioral: Negative for confusion.  Blood pressure 96/66, pulse 103, temperature 98.4 F (36.9 C), temperature source Temporal, height 5\' 2"  (1.575 m), weight 105 lb 6.4 oz (47.809 kg), SpO2 98.00%.  Physical Exam  Physical Exam  Constitutional: She is oriented  to person, place, and time. She appears well-developed and well-nourished. No distress.  HENT:  Head: Normocephalic and atraumatic.  Right Ear: External ear normal.  Left Ear: External ear normal.  Nose: Nose normal.  Mouth/Throat: Oropharynx is clear and moist. No oropharyngeal exudate.  Eyes: Conjunctivae normal are normal. Pupils are equal, round, and reactive to light. Right eye exhibits no discharge. Left eye exhibits no discharge. No scleral icterus.  Neck: Normal range of motion. Neck supple. No tracheal deviation present. No thyromegaly present.  Cardiovascular: Normal rate, regular rhythm, normal heart sounds and intact distal pulses.  No murmur heard.  Pulmonary/Chest: Effort normal and breath sounds normal. No respiratory distress. She  has no wheezes. She has no rales.  There is a 2 cm superficial mass on the left chest wall just below the clavicle. It is soft and mobile with slight erythema  Abdominal: Soft. Bowel sounds are normal. She exhibits no distension. There is no tenderness. There is no rebound.  Musculoskeletal: Normal range of motion. She exhibits no edema and no tenderness.  Lymphadenopathy:  She has no cervical adenopathy.  Neurological: She is alert and oriented to person, place, and time.  Skin: Skin is warm and dry. No rash noted. She is not diaphoretic. There is erythema. No pallor.  Psychiatric: Her behavior is normal. Judgment normal.  Data Reviewed  Assessment  2 cm left chest wall mass  Plan  I suspect this may represent a chronically infected sebaceous cyst. Given the location of the chest wall near the breast and her diabetes, removal is recommended for histological evaluation to rule out malignancy and to prevent further infection. I discussed this with her in detail. I discussed the risks of surgery which includes but is not limited to bleeding, infection, need for further surgery, recurrence, etc. She understands and wishes to proceed. Surgery will be scheduled. Likelihood of success is good

## 2013-04-03 ENCOUNTER — Encounter (HOSPITAL_COMMUNITY): Payer: Self-pay

## 2013-04-03 ENCOUNTER — Encounter (HOSPITAL_COMMUNITY): Payer: Self-pay | Admitting: Anesthesiology

## 2013-04-03 ENCOUNTER — Ambulatory Visit (HOSPITAL_COMMUNITY): Payer: Medicaid Other | Admitting: Anesthesiology

## 2013-04-03 ENCOUNTER — Ambulatory Visit (HOSPITAL_COMMUNITY)
Admission: RE | Admit: 2013-04-03 | Discharge: 2013-04-03 | Disposition: A | Discharge status: Home / Self Care | Payer: Medicaid Other | Source: Ambulatory Visit | Attending: Surgery | Admitting: Surgery

## 2013-04-03 ENCOUNTER — Encounter (HOSPITAL_COMMUNITY)
Admission: RE | Disposition: A | Discharge status: Home / Self Care | Payer: Self-pay | Source: Ambulatory Visit | Attending: Surgery

## 2013-04-03 DIAGNOSIS — L723 Sebaceous cyst: Secondary | ICD-10-CM | POA: Insufficient documentation

## 2013-04-03 DIAGNOSIS — F17200 Nicotine dependence, unspecified, uncomplicated: Secondary | ICD-10-CM | POA: Insufficient documentation

## 2013-04-03 DIAGNOSIS — F329 Major depressive disorder, single episode, unspecified: Secondary | ICD-10-CM | POA: Insufficient documentation

## 2013-04-03 DIAGNOSIS — Z79899 Other long term (current) drug therapy: Secondary | ICD-10-CM | POA: Insufficient documentation

## 2013-04-03 DIAGNOSIS — F3289 Other specified depressive episodes: Secondary | ICD-10-CM | POA: Insufficient documentation

## 2013-04-03 DIAGNOSIS — E119 Type 2 diabetes mellitus without complications: Secondary | ICD-10-CM | POA: Insufficient documentation

## 2013-04-03 DIAGNOSIS — Z794 Long term (current) use of insulin: Secondary | ICD-10-CM | POA: Insufficient documentation

## 2013-04-03 DIAGNOSIS — L02219 Cutaneous abscess of trunk, unspecified: Secondary | ICD-10-CM

## 2013-04-03 DIAGNOSIS — F411 Generalized anxiety disorder: Secondary | ICD-10-CM | POA: Insufficient documentation

## 2013-04-03 DIAGNOSIS — L03319 Cellulitis of trunk, unspecified: Secondary | ICD-10-CM

## 2013-04-03 HISTORY — PX: CYST REMOVAL TRUNK: SHX6283

## 2013-04-03 LAB — GLUCOSE, CAPILLARY: Glucose-Capillary: 187 mg/dL — ABNORMAL HIGH (ref 70–99)

## 2013-04-03 SURGERY — CYST REMOVAL TRUNK
Anesthesia: General | Site: Chest | Laterality: Left | Wound class: Clean

## 2013-04-03 MED ORDER — LACTATED RINGERS IV SOLN
INTRAVENOUS | Status: DC | PRN
  Administered 2013-04-03: 09:00:00 via INTRAVENOUS

## 2013-04-03 MED ORDER — ONDANSETRON HCL 4 MG/2ML IJ SOLN
INTRAMUSCULAR | Status: DC | PRN
  Administered 2013-04-03: 2 mg via INTRAVENOUS

## 2013-04-03 MED ORDER — FENTANYL CITRATE 0.05 MG/ML IJ SOLN
INTRAMUSCULAR | Status: DC | PRN
  Administered 2013-04-03 (×2): 50 ug via INTRAVENOUS

## 2013-04-03 MED ORDER — ONDANSETRON HCL 4 MG/2ML IJ SOLN: 4.0000 mg | Freq: Four times a day (QID) | INTRAMUSCULAR | Status: DC | PRN

## 2013-04-03 MED ORDER — ACETAMINOPHEN 650 MG RE SUPP
650.0000 mg | RECTAL | Status: DC | PRN
  Filled 2013-04-03: qty 1

## 2013-04-03 MED ORDER — OXYCODONE HCL 5 MG PO TABS
5.0000 mg | ORAL_TABLET | ORAL | Status: DC | PRN
  Administered 2013-04-03: 5 mg via ORAL
  Filled 2013-04-03: qty 1

## 2013-04-03 MED ORDER — BUPIVACAINE-EPINEPHRINE PF 0.25-1:200000 % IJ SOLN
INTRAMUSCULAR | Status: AC
  Filled 2013-04-03: qty 30

## 2013-04-03 MED ORDER — PROPOFOL 10 MG/ML IV BOLUS
INTRAVENOUS | Status: DC | PRN
  Administered 2013-04-03: 150 mg via INTRAVENOUS

## 2013-04-03 MED ORDER — LACTATED RINGERS IV SOLN
INTRAVENOUS | Status: DC
  Administered 2013-04-03: 1000 mL via INTRAVENOUS

## 2013-04-03 MED ORDER — FENTANYL CITRATE 0.05 MG/ML IJ SOLN: 25.0000 ug | INTRAMUSCULAR | Status: DC | PRN

## 2013-04-03 MED ORDER — SODIUM CHLORIDE 0.9 % IV SOLN: 250.0000 mL | INTRAVENOUS | Status: DC | PRN

## 2013-04-03 MED ORDER — BUPIVACAINE-EPINEPHRINE 0.25% -1:200000 IJ SOLN
INTRAMUSCULAR | Status: DC | PRN
  Administered 2013-04-03: 10 mL

## 2013-04-03 MED ORDER — DEXAMETHASONE SODIUM PHOSPHATE 4 MG/ML IJ SOLN
INTRAMUSCULAR | Status: DC | PRN
  Administered 2013-04-03: 8 mg via INTRAVENOUS

## 2013-04-03 MED ORDER — MORPHINE SULFATE 10 MG/ML IJ SOLN: 4.0000 mg | INTRAMUSCULAR | Status: DC | PRN

## 2013-04-03 MED ORDER — CEFAZOLIN SODIUM-DEXTROSE 2-3 GM-% IV SOLR
INTRAVENOUS | Status: AC
  Filled 2013-04-03: qty 50

## 2013-04-03 MED ORDER — KETAMINE HCL 10 MG/ML IJ SOLN
INTRAMUSCULAR | Status: DC | PRN
  Administered 2013-04-03: 10 mg via INTRAVENOUS

## 2013-04-03 MED ORDER — LIDOCAINE HCL (CARDIAC) 20 MG/ML IV SOLN
INTRAVENOUS | Status: DC | PRN
  Administered 2013-04-03: 30 mg via INTRAVENOUS

## 2013-04-03 MED ORDER — CEFAZOLIN SODIUM-DEXTROSE 2-3 GM-% IV SOLR
2.0000 g | INTRAVENOUS | Status: AC
  Administered 2013-04-03: 2 g via INTRAVENOUS

## 2013-04-03 MED ORDER — PROMETHAZINE HCL 25 MG/ML IJ SOLN: 6.2500 mg | INTRAMUSCULAR | Status: DC | PRN

## 2013-04-03 MED ORDER — ACETAMINOPHEN 325 MG PO TABS: 650.0000 mg | ORAL_TABLET | ORAL | Status: DC | PRN

## 2013-04-03 MED ORDER — SODIUM CHLORIDE 0.9 % IJ SOLN: 3.0000 mL | Freq: Two times a day (BID) | INTRAMUSCULAR | Status: DC

## 2013-04-03 MED ORDER — HYDROCODONE-ACETAMINOPHEN 5-325 MG PO TABS: 1.0000 | ORAL_TABLET | ORAL | Status: DC | PRN

## 2013-04-03 MED ORDER — SODIUM CHLORIDE 0.9 % IJ SOLN: 3.0000 mL | INTRAMUSCULAR | Status: DC | PRN

## 2013-04-03 MED ORDER — MIDAZOLAM HCL 5 MG/5ML IJ SOLN
INTRAMUSCULAR | Status: DC | PRN
  Administered 2013-04-03: 0.5 mg via INTRAVENOUS

## 2013-04-03 MED ORDER — 0.9 % SODIUM CHLORIDE (POUR BTL) OPTIME
TOPICAL | Status: DC | PRN
  Administered 2013-04-03: 1000 mL

## 2013-04-03 SURGICAL SUPPLY — 35 items
APL SKNCLS STERI-STRIP NONHPOA (GAUZE/BANDAGES/DRESSINGS) ×1
BENZOIN TINCTURE PRP APPL 2/3 (GAUZE/BANDAGES/DRESSINGS) ×2 IMPLANT
BLADE SURG 15 STRL LF DISP TIS (BLADE) ×1 IMPLANT
BLADE SURG 15 STRL SS (BLADE) ×2
CANISTER SUCTION 2500CC (MISCELLANEOUS) ×1 IMPLANT
CLOTH BEACON ORANGE TIMEOUT ST (SAFETY) ×2 IMPLANT
COVER SURGICAL LIGHT HANDLE (MISCELLANEOUS) ×1 IMPLANT
DECANTER SPIKE VIAL GLASS SM (MISCELLANEOUS) ×1 IMPLANT
DRAPE LAPAROSCOPIC ABDOMINAL (DRAPES) IMPLANT
DRAPE PED LAPAROTOMY (DRAPES) ×1 IMPLANT
ELECT REM PT RETURN 9FT ADLT (ELECTROSURGICAL) ×2
ELECTRODE REM PT RTRN 9FT ADLT (ELECTROSURGICAL) ×1 IMPLANT
GAUZE SPONGE 2X2 8PLY STRL LF (GAUZE/BANDAGES/DRESSINGS) IMPLANT
GLOVE SURG SIGNA 7.5 PF LTX (GLOVE) ×2 IMPLANT
GLOVE SURG SS PI 7.0 STRL IVOR (GLOVE) ×1 IMPLANT
GOWN PREVENTION PLUS XLARGE (GOWN DISPOSABLE) ×2 IMPLANT
GOWN STRL NON-REIN LRG LVL3 (GOWN DISPOSABLE) ×2 IMPLANT
KIT BASIN OR (CUSTOM PROCEDURE TRAY) ×2 IMPLANT
KIT ROOM TURNOVER OR (KITS) ×2 IMPLANT
NDL HYPO 25X1 1.5 SAFETY (NEEDLE) ×1 IMPLANT
NEEDLE HYPO 25X1 1.5 SAFETY (NEEDLE) ×2 IMPLANT
NS IRRIG 1000ML POUR BTL (IV SOLUTION) ×2 IMPLANT
PACK BASIC VI WITH GOWN DISP (CUSTOM PROCEDURE TRAY) ×2 IMPLANT
PENCIL BUTTON HOLSTER BLD 10FT (ELECTRODE) ×2 IMPLANT
SPONGE GAUZE 2X2 STER 10/PKG (GAUZE/BANDAGES/DRESSINGS) ×1
SPONGE GAUZE 4X4 12PLY (GAUZE/BANDAGES/DRESSINGS) ×1 IMPLANT
SPONGE LAP 18X18 X RAY DECT (DISPOSABLE) ×2 IMPLANT
STRIP CLOSURE SKIN 1/2X4 (GAUZE/BANDAGES/DRESSINGS) ×2 IMPLANT
SUT MNCRL AB 4-0 PS2 18 (SUTURE) ×2 IMPLANT
SUT VIC AB 3-0 SH 27 (SUTURE) ×2
SUT VIC AB 3-0 SH 27XBRD (SUTURE) ×1 IMPLANT
SYR BULB 3OZ (MISCELLANEOUS) ×1 IMPLANT
SYR CONTROL 10ML LL (SYRINGE) ×2 IMPLANT
TOWEL OR 17X26 10 PK STRL BLUE (TOWEL DISPOSABLE) ×2 IMPLANT
YANKAUER SUCT BULB TIP NO VENT (SUCTIONS) IMPLANT

## 2013-04-03 NOTE — Progress Notes (Signed)
Patient has insulin pump at 0.5 units / hr per her MD. Patient has instructions for home regulation after surgery.Spoke w [place]. Note left on chart

## 2013-04-03 NOTE — Interval H&P Note (Signed)
History and Physical Interval Note: no change in H and P  04/03/2013 7:01 AM  Traci Ellison  has presented today for surgery, with the diagnosis of CHRONIC LEFT CHEST SEBACEOUS CYST   The various methods of treatment have been discussed with the patient and family. After consideration of risks, benefits and other options for treatment, the patient has consented to  Procedure(s): EXCISION CHRONIC SEBACEOUS CYST LEFT UPPER CHEST WALL (Left) as a surgical intervention .  The patient's history has been reviewed, patient examined, no change in status, stable for surgery.  I have reviewed the patient's chart and labs.  Questions were answered to the patient's satisfaction.     [name] A

## 2013-04-03 NOTE — Progress Notes (Signed)
CBG 313. Patient gave herself a bolus of insulin. Will recheck CBG in 20 minutes

## 2013-04-03 NOTE — Transfer of Care (Signed)
Immediate Anesthesia Transfer of Care Note  Patient: Traci Ellison  Procedure(s) Performed: Procedure(s): EXCISION CHRONIC SEBACEOUS CYST LEFT UPPER CHEST WALL (Left)  Patient Location: PACU  Anesthesia Type:General  Level of Consciousness: awake and sedated  Airway & Oxygen Therapy: Patient Spontanous Breathing and Patient connected to face mask oxygen  Post-op Assessment: Report given to PACU RN and Post -op Vital signs reviewed and stable  Post vital signs: stable  Complications: No apparent anesthesia complications

## 2013-04-03 NOTE — Op Note (Signed)
EXCISION CHRONIC SEBACEOUS CYST LEFT UPPER CHEST WALL  Procedure Note  Traci Ellison 04/03/2013   Pre-op Diagnosis: CHRONIC LEFT CHEST SEBACEOUS CYST      Post-op Diagnosis: same  Procedure(s): EXCISION CHRONIC SEBACEOUS CYST LEFT UPPER CHEST WALL  Surgeon(s): Shelly Rubenstein, MD  Anesthesia: General  Staff:  Circulator: Wilhelmenia Blase, RN Scrub Person: Clarnce Flock, CST  Estimated Blood Loss: Minimal               Specimens: sent to path          [facility] A   Date: 04/03/2013  Time: 9:34 AM

## 2013-04-03 NOTE — Progress Notes (Signed)
Pt. Currently has insulin pump infusing at .5u/hr.  CBG 153 mg/dl.  Dr. Okey Dupre notified.  Pump to continue at current  Setting as or procedure is scheduled to be very short in duration.  [facility] aware of pump management.

## 2013-04-03 NOTE — Anesthesia Postprocedure Evaluation (Signed)
  Anesthesia Post-op Note  Patient: Traci Ellison  Procedure(s) Performed: Procedure(s) (LRB): EXCISION CHRONIC SEBACEOUS CYST LEFT UPPER CHEST WALL (Left)  Patient Location: PACU  Anesthesia Type: General  Level of Consciousness: awake and alert   Airway and Oxygen Therapy: Patient Spontanous Breathing  Post-op Pain: mild  Post-op Assessment: Post-op Vital signs reviewed, Patient's Cardiovascular Status Stable, Respiratory Function Stable, Patent Airway and No signs of Nausea or vomiting  Last Vitals:  Filed Vitals:   04/03/13 1000  BP: 109/59  Pulse: 56  Temp:   Resp: 16    Post-op Vital Signs: stable   Complications: No apparent anesthesia complications

## 2013-04-03 NOTE — Anesthesia Preprocedure Evaluation (Signed)
Anesthesia Evaluation  Patient identified by MRN, date of birth, ID band Patient awake    Reviewed: Allergy & Precautions, H&P , NPO status , Patient's Chart, lab work & pertinent test results  Airway Mallampati: II TM Distance: >3 FB Neck ROM: Full    Dental no notable dental hx.    Pulmonary Current Smoker,  breath sounds clear to auscultation  Pulmonary exam normal       Cardiovascular negative cardio ROS  Rhythm:Regular Rate:Normal     Neuro/Psych negative neurological ROS  negative psych ROS   GI/Hepatic negative GI ROS, Neg liver ROS,   Endo/Other  diabetes, Insulin Dependent  Renal/GU negative Renal ROS  negative genitourinary   Musculoskeletal negative musculoskeletal ROS (+)   Abdominal   Peds negative pediatric ROS (+)  Hematology negative hematology ROS (+)   Anesthesia Other Findings   Reproductive/Obstetrics negative OB ROS                           Anesthesia Physical Anesthesia Plan  ASA: II  Anesthesia Plan: General   Post-op Pain Management:    Induction: Intravenous  Airway Management Planned: LMA  Additional Equipment:   Intra-op Plan:   Post-operative Plan:   Informed Consent: I have reviewed the patients History and Physical, chart, labs and discussed the procedure including the risks, benefits and alternatives for the proposed anesthesia with the patient or authorized representative who has indicated his/her understanding and acceptance.   Dental advisory given  Plan Discussed with: CRNA and Surgeon  Anesthesia Plan Comments:         Anesthesia Quick Evaluation

## 2013-04-03 NOTE — Progress Notes (Signed)
Patient returned to short stay from PACU. Insulin pump  Continues to infuse at 0.5units/hr of Humalog Insulin

## 2013-04-04 NOTE — Op Note (Signed)
NAMESHYLYN, YOUNCE             ACCOUNT NO.:  [id number]  MEDICAL RECORD NO.:  [id number]  LOCATION:  [facility]                         FACILITY:  [facility]  PHYSICIAN:  Abigail Miyamoto, M.D. 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  PROCEDURE:  Excision of 1 cm chronic left chest wall sebaceous cyst.  SURGEON:  Abigail Miyamoto, M.D.  ANESTHESIA:  General and 0.5% Marcaine.  ESTIMATED BLOOD LOSS:  Minimal.  INDICATIONS:  This is a 27 year old female.  She has had recurrent infectious sebaceous cyst on the left chest.  Decision was made to proceed with excision of this area for further infections.  PROCEDURE IN DETAIL:  The patient was brought to the operating room, identified as [employer].  She was placed in supine on the operating room table and general anesthesia was induced.  Her left chest was prepped and draped in usual sterile fashion.  The sebaceous cyst was on the upper chest just underneath the clavicle.  I anesthetized the skin with Marcaine.  I made an elliptical incision around the chronic opening with a scalpel and took this down to the subcutaneous tissue with cautery.  I then widely excised the cyst in its entirety with the cautery.  I probed the wound further and found no other abnormalities. I anesthetized the wound further with Marcaine.  Hemostasis achieved with cautery.  I closed subcutaneous tissue with interrupted 3-0 Vicryl sutures and closed the skin with running 4-0 Monocryl.  Steri-Strips, gauze, and tape were then applied.  The patient tolerated the procedure well.  All the counts were correct at the end of procedure.  The patient was then extubated in the operating room and taken in stable condition to recovery  room.     Abigail Miyamoto, M.D.     DB/MEDQ  D:  04/03/2013  T:  04/04/2013  Job:  [id number]

## 2013-04-07 ENCOUNTER — Encounter (HOSPITAL_COMMUNITY): Payer: Self-pay | Admitting: Surgery

## 2013-06-11 ENCOUNTER — Other Ambulatory Visit: Payer: Self-pay

## 2014-05-21 ENCOUNTER — Other Ambulatory Visit: Payer: Self-pay

## 2014-06-07 ENCOUNTER — Encounter (HOSPITAL_COMMUNITY): Payer: Self-pay | Admitting: Surgery

## 2015-11-26 LAB — TSH: TSH: 1.38 u[IU]/mL (ref <=5.90)

## 2015-11-26 LAB — LIPID PANEL
Cholesterol: 59 mg/dL (ref 0–200)
HDL: 59 mg/dL (ref 35–70)
LDL CALC: 87 mg/dL
Triglycerides: 88 mg/dL (ref 40–160)

## 2015-11-26 LAB — HEMOGLOBIN A1C: Hemoglobin A1C: 12.8

## 2016-07-24 LAB — HEMOGLOBIN A1C: HEMOGLOBIN A1C: 11.2

## 2016-09-04 ENCOUNTER — Ambulatory Visit: Payer: Self-pay | Admitting: "Endocrinology

## 2016-09-21 ENCOUNTER — Ambulatory Visit (INDEPENDENT_AMBULATORY_CARE_PROVIDER_SITE_OTHER): Payer: Medicaid Other | Admitting: "Endocrinology

## 2016-09-21 ENCOUNTER — Encounter: Payer: Self-pay | Admitting: "Endocrinology

## 2016-09-21 VITALS — BP 106/71 | HR 103 | Ht 62.0 in | Wt 106.0 lb

## 2016-09-21 DIAGNOSIS — E108 Type 1 diabetes mellitus with unspecified complications: Secondary | ICD-10-CM

## 2016-09-21 DIAGNOSIS — E1065 Type 1 diabetes mellitus with hyperglycemia: Secondary | ICD-10-CM

## 2016-09-21 DIAGNOSIS — IMO0002 Reserved for concepts with insufficient information to code with codable children: Secondary | ICD-10-CM

## 2016-09-21 NOTE — Progress Notes (Signed)
Subjective:    Patient ID: Traci Ellison, female    DOB: Jul 04, 1986. Patient is being seen in consultation for management of diabetes requested by  House, Eugenio HoesKaren A, FNP  Past Medical History:  Diagnosis Date  . Anxiety    8 months ago  . Depression    8 months ago  . Diabetes mellitus    type 1--since age 31  . Headache(784.0)    migraines, last 3 days ago-occurs occ.  Marland Kitchen. History of kidney stones    x 1    Past Surgical History:  Procedure Laterality Date  . CERVICAL BIOPSY  W/ LOOP ELECTRODE EXCISION    . childbirth      x2 -last 3 months ago-[facility]  . CYST REMOVAL TRUNK Left 04/03/2013   Procedure: EXCISION CHRONIC SEBACEOUS CYST LEFT UPPER CHEST WALL;  Surgeon: Shelly Rubensteinouglas A Blackman, MD;  Location: [facility];  Service: General;  Laterality: Left;  . INDUCED ABORTION     Social History   Social History  . Marital status: Divorced    Spouse name: N/A  . Number of children: N/A  . Years of education: N/A   Social History Main Topics  . Smoking status: Current Every Day Smoker    Packs/day: 0.50    Years: 0.50    Types: Cigarettes  . Smokeless tobacco: Never Used  . Alcohol use No  . Drug use: No  . Sexual activity: Yes    Birth control/ protection: Condom   Other Topics Concern  . None   Social History Narrative  . None   Outpatient Encounter Prescriptions as of 09/21/2016  Medication Sig  . cyclobenzaprine (FLEXERIL) 10 MG tablet Take 10 mg by mouth 3 (three) times daily as needed for muscle spasms.  . insulin glargine (LANTUS) 100 UNIT/ML injection Inject 30 Units into the skin at bedtime.  . insulin lispro (HUMALOG) 100 UNIT/ML injection Inject 5-11 Units into the skin 3 (three) times daily with meals.  . norethindrone (MICRONOR,CAMILA,ERRIN) 0.35 MG tablet Take 1 tablet by mouth daily.  . ranitidine (ZANTAC) 150 MG tablet Take 150 mg by mouth 3 (three) times daily as needed for heartburn.  . [DISCONTINUED] etonogestrel-ethinyl estradiol  (NUVARING) 0.12-0.015 MG/24HR vaginal ring Place 1 each vaginally every 28 (twenty-eight) days. Insert vaginally and leave in place for 3 consecutive weeks, then remove for 1 week.  . [DISCONTINUED] HYDROcodone-acetaminophen (NORCO) 5-325 MG per tablet Take 1-2 tablets by mouth every 4 (four) hours as needed for pain.  . [DISCONTINUED] Insulin Human (INSULIN PUMP) 100 unit/ml SOLN Inject 1 each into the skin continuous. Pt uses humalog insulin in her pump basal rate is .0.5 units for 24 hours   No facility-administered encounter medications on file as of 09/21/2016.    ALLERGIES: Allergies  Allergen Reactions  . Diclofenac Hives and Itching  . Nsaids    VACCINATION STATUS:  There is no immunization history on file for this patient.  Diabetes  She presents for her initial diabetic visit. She has type 1 diabetes mellitus. Onset time: She was diagnosed at approximate age of 31 years. Her disease course has been worsening. There are no hypoglycemic associated symptoms. Pertinent negatives for hypoglycemia include no confusion, headaches, pallor or seizures. Associated symptoms include blurred vision, fatigue, polydipsia and polyuria. Pertinent negatives for diabetes include no chest pain and no polyphagia. There are no hypoglycemic complications. Symptoms are worsening. Risk factors for coronary artery disease include diabetes mellitus, tobacco exposure and family history. Current diabetic treatments:  She is on Lantus 40 units nightly and Humalog sliding scale. Her weight is decreasing steadily. She is following a generally unhealthy diet. When asked about meal planning, she reported none. She has not had a previous visit with a dietitian. She participates in exercise intermittently. Her home blood glucose trend is increasing steadily. (She did not bring any meter to review today. She admits she does not monitor blood glucose regularly. ) An ACE inhibitor/angiotensin II receptor blocker is not being  taken. She does not see a podiatrist.Eye exam is not current.       Review of Systems  Constitutional: Positive for fatigue and unexpected weight change. Negative for chills and fever.  HENT: Negative for trouble swallowing and voice change.   Eyes: Positive for blurred vision. Negative for visual disturbance.  Respiratory: Negative for cough, shortness of breath and wheezing.   Cardiovascular: Negative for chest pain, palpitations and leg swelling.  Gastrointestinal: Negative for diarrhea, nausea and vomiting.  Endocrine: Positive for polydipsia and polyuria. Negative for cold intolerance, heat intolerance and polyphagia.  Musculoskeletal: Negative for arthralgias and myalgias.  Skin: Negative for color change, pallor, rash and wound.  Neurological: Negative for seizures and headaches.  Psychiatric/Behavioral: Negative for confusion and suicidal ideas.    Objective:    BP 106/71   Pulse (!) 103   Ht 5\' 2"  (1.575 m)   Wt 106 lb (48.1 kg)   BMI 19.39 kg/m   Wt Readings from Last 3 Encounters:  09/21/16 106 lb (48.1 kg)  03/31/13 109 lb 6 oz (49.6 kg)  03/23/13 104 lb 12.8 oz (47.5 kg)    Physical Exam  Constitutional: She is oriented to person, place, and time.  Light build with significant body weight deficit.  HENT:  Head: Normocephalic and atraumatic.  Poor dental condition.  Eyes: EOM are normal.  Neck: Normal range of motion. Neck supple. No tracheal deviation present. No thyromegaly present.  Cardiovascular: Normal rate and regular rhythm.   Pulmonary/Chest: Effort normal and breath sounds normal.  Abdominal: Soft. Bowel sounds are normal. There is no tenderness. There is no guarding.  Musculoskeletal: Normal range of motion. She exhibits no edema.  Neurological: She is alert and oriented to person, place, and time. She has normal reflexes. No cranial nerve deficit. Coordination normal.  Skin: Skin is warm and dry. No rash noted. No erythema. No pallor.   Psychiatric: She has a normal mood and affect. Judgment normal.    CMP     Component Value Date/Time   NA 142 03/31/2013 1045   K 3.4 (L) 03/31/2013 1045   CL 105 03/31/2013 1045   CO2 27 03/31/2013 1045   GLUCOSE 61 (L) 03/31/2013 1045   BUN 9 03/31/2013 1045   CREATININE 0.56 03/31/2013 1045   CALCIUM 9.8 03/31/2013 1045   PROT 6.8 08/18/2011 2309   ALBUMIN 3.5 08/18/2011 2309   AST 9 08/18/2011 2309   ALT 12 08/18/2011 2309   ALKPHOS 56 08/18/2011 2309   BILITOT 0.2 (L) 08/18/2011 2309   GFRNONAA >90 03/31/2013 1045   GFRAA >90 03/31/2013 1045     Diabetic Labs (most recent): Lab Results  Component Value Date   HGBA1C 11.2 07/24/2016   HGBA1C 12.8 11/26/2015   HGBA1C 9.2 (H) 01/30/2011     Lipid Panel ( most recent) Lipid Panel     Component Value Date/Time   CHOL 59 11/26/2015   TRIG 88 11/26/2015   HDL 59 11/26/2015   LDLCALC 87 11/26/2015  Assessment & Plan:   1. Uncontrolled type 1 diabetes mellitus with complication [facility])  - Patient has currently uncontrolled symptomatic type 1 DM since  31 years of age. - Her two most recent A1c's are 11.2% and 12.8%.  Recent labs reviewed, showing normal renal function.  - She did not report gross complications however she remains at a high risk for more acute and chronic complications of diabetes which include CAD, CVA, CKD, retinopathy, and neuropathy. These are all discussed in detail with the patient.  - I have counseled the patient on diet management, by adopting a carbohydrate restricted/protein rich diet.  - Suggestion is made for patient to avoid simple carbohydrates   from their diet including Cakes , Desserts, Ice Cream,  Soda (  diet and regular) , Sweet Tea , Candies,  Chips, Cookies, Artificial Sweeteners,   and "Sugar-free" Products .  However, she is allowed to introduce more complex carbs including whole-wheat/multigrain bread, rice, and pasta. This will help patient to have stable blood glucose  profile . - I encouraged the patient to switch to  unprocessed or minimally processed complex starch and increased protein intake (animal or plant source), fruits, and vegetables.  - Patient is advised to stick to a routine mealtimes to eat 3 meals  a day and avoid unnecessary snacks ( to snack only to correct hypoglycemia).  - The patient will be scheduled with Norm Salt, RDN, CDE for individualized DM education.  - I have approached patient with the following individualized plan to manage diabetes and patient agrees:   - I  will proceed to adjust her basal insulin Lantus to 30 units daily at bedtime, initiate prandial insulin with Humalog 5 units 3 times a day before meals for pre-meal BG readings of 90-150mg /dl, plus patient specific correction dose for unexpected hyperglycemia above 150mg /dl, associated with strict monitoring of glucose  AC and HS. - Patient is warned not to take insulin without proper monitoring per orders. -Adjustment parameters are given for hypo and hyperglycemia in writing. -Patient is encouraged to call clinic for blood glucose levels less than 70 or above 300 mg /dl. -Insulin is a exclusive choice of therapy for her. - She is not a candidate for incretin therapy ,  metformin, SGLT2 inhibitors. - Patient specific target  A1c;  LDL, HDL, Triglycerides, and  Waist Circumference were discussed in detail.  2) BP/HTN:  controlled.  She is not on any medications currently. 3) Lipids/HPL:  controlled.  Currently, she is not on  statins. 4)  Weight/Diet: She would benefit from some weight gain, CDE Consult will be initiated , exercise, and detailed carbohydrates information provided.  5) Chronic Care/Health Maintenance:  -Patient is encouraged to continue to follow up with Ophthalmology, dentist ,  Podiatrist at least yearly or according to recommendations, and advised to  quit smoking. I have recommended yearly flu vaccine and pneumonia vaccination at least every 5  years; moderate intensity exercise for up to 150 minutes weekly; and  sleep for at least 7 hours a day.  - 60 minutes of time was spent on the care of this patient , 50% of which was applied for counseling on diabetes complications and their preventions.  - Patient to bring meter and  blood glucose logs during her next visit.   - I advised patient to maintain close follow up with House, Eugenio Hoes, FNP for primary care needs.  Follow up plan: - Return in about 1 week (around 09/28/2016) for follow up with meter  and logs- no labs.  Marquis Lunch, MD Phone: [phone]  Fax: [phone]   09/21/2016, 12:49 PM

## 2016-09-28 ENCOUNTER — Encounter: Payer: Self-pay | Admitting: "Endocrinology

## 2016-09-28 ENCOUNTER — Ambulatory Visit (INDEPENDENT_AMBULATORY_CARE_PROVIDER_SITE_OTHER): Payer: Medicaid Other | Admitting: "Endocrinology

## 2016-09-28 VITALS — BP 116/75 | HR 89 | Ht 62.0 in | Wt 112.0 lb

## 2016-09-28 DIAGNOSIS — E108 Type 1 diabetes mellitus with unspecified complications: Secondary | ICD-10-CM

## 2016-09-28 DIAGNOSIS — F17200 Nicotine dependence, unspecified, uncomplicated: Secondary | ICD-10-CM | POA: Diagnosis not present

## 2016-09-28 DIAGNOSIS — E1065 Type 1 diabetes mellitus with hyperglycemia: Secondary | ICD-10-CM

## 2016-09-28 DIAGNOSIS — IMO0002 Reserved for concepts with insufficient information to code with codable children: Secondary | ICD-10-CM

## 2016-09-28 MED ORDER — INSULIN LISPRO 100 UNIT/ML (KWIKPEN): 5.0000 [IU] | PEN_INJECTOR | Freq: Three times a day (TID) | SUBCUTANEOUS | 3 refills | Status: DC

## 2016-09-28 MED ORDER — INSULIN GLARGINE 100 UNIT/ML SOLOSTAR PEN: 30.0000 [IU] | PEN_INJECTOR | Freq: Every day | SUBCUTANEOUS | 3 refills | Status: DC

## 2016-09-28 MED ORDER — INSULIN PEN NEEDLE 32G X 4 MM MISC: 1.0000 | Freq: Four times a day (QID) | 5 refills | Status: DC

## 2016-09-28 NOTE — Progress Notes (Signed)
Subjective:    Patient ID: Traci Ellison, female    DOB: 08/04/1986. Patient is being seen in consultation for management of diabetes requested by  House, Eugenio Hoes, FNP  Past Medical History:  Diagnosis Date  . Anxiety    8 months ago  . Depression    8 months ago  . Diabetes mellitus    type 1--since age 31  . Headache(784.0)    migraines, last 3 days ago-occurs occ.  Marland Kitchen History of kidney stones    x 1    Past Surgical History:  Procedure Laterality Date  . CERVICAL BIOPSY  W/ LOOP ELECTRODE EXCISION    . childbirth      x2 -last 3 months ago-[facility]  . 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 INDUCED ABORTION     Social History   Social History  . Marital status: Divorced    Spouse name: N/A  . Number of children: N/A  . Years of education: N/A   Social History Main Topics  . Smoking status: Current Every Day Smoker    Packs/day: 0.50    Years: 0.50    Types: Cigarettes  . Smokeless tobacco: Never Used  . Alcohol use No  . Drug use: No  . Sexual activity: Yes    Birth control/ protection: Condom   Other Topics Concern  . Not on file   Social History Narrative  . No narrative on file   Outpatient Encounter Prescriptions as of 09/28/2016  Medication Sig  . cyclobenzaprine (FLEXERIL) 10 MG tablet Take 10 mg by mouth 3 (three) times daily as needed for muscle spasms.  . Insulin Glargine (LANTUS SOLOSTAR) 100 UNIT/ML Solostar Pen Inject 30 Units into the skin daily at 10 pm.  . insulin lispro (HUMALOG KWIKPEN) 100 UNIT/ML KiwkPen Inject 0.05-0.11 mLs (5-11 Units total) into the skin 3 (three) times daily with meals.  . Insulin Pen Needle (BD PEN NEEDLE NANO U/F) 32G X 4 MM MISC 1 each by Does not apply route 4 (four) times daily.  . norethindrone (MICRONOR,CAMILA,ERRIN) 0.35 MG tablet Take 1  tablet by mouth daily.  . ranitidine (ZANTAC) 150 MG tablet Take 150 mg by mouth 3 (three) times daily as needed for heartburn.  . [DISCONTINUED] insulin glargine (LANTUS) 100 UNIT/ML injection Inject 30 Units into the skin at bedtime.  . [DISCONTINUED] insulin lispro (HUMALOG) 100 UNIT/ML injection Inject 5-11 Units into the skin 3 (three) times daily with meals.   No facility-administered encounter medications on file as of 09/28/2016.    ALLERGIES: Allergies  Allergen Reactions  . Diclofenac Hives and Itching  . Nsaids    VACCINATION STATUS:  There is no immunization history on file for this patient.  Diabetes  She presents for her follow-up diabetic visit. She has type 1 diabetes mellitus. Onset time: She was diagnosed at approximate age of 9 years. Her disease course has been improving. There are no hypoglycemic associated symptoms. Pertinent negatives for hypoglycemia include no confusion, headaches, pallor or seizures. Associated symptoms include blurred vision, fatigue, polydipsia and polyuria. Pertinent negatives for diabetes include no chest pain and no polyphagia. There are no hypoglycemic complications. Symptoms are improving. Risk factors for coronary artery disease include diabetes mellitus, tobacco exposure and family history. Current diabetic treatments: She is on Lantus 40 units nightly and Humalog sliding  scale. Her weight is increasing steadily. She is following a generally unhealthy diet. When asked about meal planning, she reported none. She has not had a previous visit with a dietitian. She participates in exercise intermittently. Her home blood glucose trend is decreasing steadily. Her breakfast blood glucose range is generally 180-200 mg/dl. Her lunch blood glucose range is generally 140-180 mg/dl. Her dinner blood glucose range is generally 140-180 mg/dl. Her overall blood glucose range is 140-180 mg/dl. (She did not bring any meter to review today. She admits she does not  monitor blood glucose regularly. ) An ACE inhibitor/angiotensin II receptor blocker is not being taken. She does not see a podiatrist.Eye exam is not current.       Review of Systems  Constitutional: Positive for fatigue and unexpected weight change. Negative for chills and fever.  HENT: Negative for trouble swallowing and voice change.   Eyes: Positive for blurred vision. Negative for visual disturbance.  Respiratory: Negative for cough, shortness of breath and wheezing.   Cardiovascular: Negative for chest pain, palpitations and leg swelling.  Gastrointestinal: Negative for diarrhea, nausea and vomiting.  Endocrine: Positive for polydipsia and polyuria. Negative for cold intolerance, heat intolerance and polyphagia.  Musculoskeletal: Negative for arthralgias and myalgias.  Skin: Negative for color change, pallor, rash and wound.  Neurological: Negative for seizures and headaches.  Psychiatric/Behavioral: Negative for confusion and suicidal ideas.    Objective:    BP 116/75   Pulse 89   Ht 5\' 2"  (1.575 m)   Wt 112 lb (50.8 kg)   BMI 20.49 kg/m   Wt Readings from Last 3 Encounters:  09/28/16 112 lb (50.8 kg)  09/21/16 106 lb (48.1 kg)  03/31/13 109 lb 6 oz (49.6 kg)    Physical Exam  Constitutional: She is oriented to person, place, and time.  Light build with significant body weight deficit.  HENT:  Head: Normocephalic and atraumatic.  Poor dental condition.  Eyes: EOM are normal.  Neck: Normal range of motion. Neck supple. No tracheal deviation present. No thyromegaly present.  Cardiovascular: Normal rate and regular rhythm.   Pulmonary/Chest: Effort normal and breath sounds normal.  Abdominal: Soft. Bowel sounds are normal. There is no tenderness. There is no guarding.  Musculoskeletal: Normal range of motion. She exhibits no edema.  Neurological: She is alert and oriented to person, place, and time. She has normal reflexes. No cranial nerve deficit. Coordination  normal.  Skin: Skin is warm and dry. No rash noted. No erythema. No pallor.  Psychiatric: She has a normal mood and affect. Judgment normal.    CMP     Component Value Date/Time   NA 142 03/31/2013 1045   K 3.4 (L) 03/31/2013 1045   CL 105 03/31/2013 1045   CO2 27 03/31/2013 1045   GLUCOSE 61 (L) 03/31/2013 1045   BUN 9 03/31/2013 1045   CREATININE 0.56 03/31/2013 1045   CALCIUM 9.8 03/31/2013 1045   PROT 6.8 08/18/2011 2309   ALBUMIN 3.5 08/18/2011 2309   AST 9 08/18/2011 2309   ALT 12 08/18/2011 2309   ALKPHOS 56 08/18/2011 2309   BILITOT 0.2 (L) 08/18/2011 2309   GFRNONAA >90 03/31/2013 1045   GFRAA >90 03/31/2013 1045     Diabetic Labs (most recent): Lab Results  Component Value Date   HGBA1C 11.2 07/24/2016   HGBA1C 12.8 11/26/2015   HGBA1C 9.2 (H) 01/30/2011     Lipid Panel ( most recent) Lipid Panel     Component Value Date/Time  CHOL 59 11/26/2015   TRIG 88 11/26/2015   HDL 59 11/26/2015   LDLCALC 87 11/26/2015     Assessment & Plan:   1. Uncontrolled type 1 diabetes mellitus with complication [facility])  - Patient has currently uncontrolled symptomatic type 1 DM since  31 years of age. -  She came with significant improvement in her blood glucose profile since last visit. She has gained 6 pounds which is a good development for her.   -Her two most recent A1c's are 11.2% and 12.8%.  Recent labs reviewed, showing normal renal function.  - She did not report gross complications however she remains at a high risk for more acute and chronic complications of diabetes which include CAD, CVA, CKD, retinopathy, and neuropathy. These are all discussed in detail with the patient.  - I have counseled the patient on diet management, by adopting a carbohydrate restricted/protein rich diet.  - Suggestion is made for patient to avoid simple carbohydrates   from their diet including Cakes , Desserts, Ice Cream,  Soda (  diet and regular) , Sweet Tea , Candies,  Chips,  Cookies, Artificial Sweeteners,   and "Sugar-free" Products .  However, she is allowed to introduce more complex carbs including whole-wheat/multigrain bread, rice, and pasta. This will help patient to have stable blood glucose profile . - I encouraged the patient to switch to  unprocessed or minimally processed complex starch and increased protein intake (animal or plant source), fruits, and vegetables.  - Patient is advised to stick to a routine mealtimes to eat 3 meals  a day and avoid unnecessary snacks ( to snack only to correct hypoglycemia).  - The patient will be scheduled with Norm Salt, RDN, CDE for individualized DM education.  - I have approached patient with the following individualized plan to manage diabetes and patient agrees:   - I  Will continue  her basal insulin Lantus  30 units daily at bedtime,  prandial insulin with Humalog 5 units 3 times a day before meals for pre-meal BG readings of 90-150mg /dl, plus patient specific correction dose for unexpected hyperglycemia above 150mg /dl, associated with strict monitoring of glucose  AC and HS. - Patient is warned not to take insulin without proper monitoring per orders. -Adjustment parameters are given for hypo and hyperglycemia in writing. -Patient is encouraged to call clinic for blood glucose levels less than 70 or above 300 mg /dl. -Insulin is a exclusive choice of therapy for her. - She is not a candidate for incretin therapy ,  metformin, SGLT2 inhibitors. - Patient specific target  A1c;  LDL, HDL, Triglycerides, and  Waist Circumference were discussed in detail.  2) BP/HTN:  controlled.  She is not on any medications currently. 3) Lipids/HPL:  controlled.  Currently, she is not on  statins. 4)  Weight/Diet: She would benefit from some more weight gain- gained 6 pounds since last visit, CDE Consult will be initiated , exercise, and detailed carbohydrates information provided.  5) Chronic Care/Health  Maintenance:  -Patient is encouraged to continue to follow up with Ophthalmology, dentist ,  Podiatrist at least yearly or according to recommendations, and advised to  quit smoking. I have recommended yearly flu vaccine and pneumonia vaccination at least every 5 years; moderate intensity exercise for up to 150 minutes weekly; and  sleep for at least 7 hours a day.  - 30 minutes of time was spent on the care of this patient , 50% of which was applied for counseling on diabetes  complications and their preventions.  - Patient to bring meter and  blood glucose logs during her next visit.   - I advised patient to maintain close follow up with House, Eugenio HoesKaren A, FNP for primary care needs.  Follow up plan: - Return in about 6 weeks (around 11/09/2016) for meter, and logs.  Marquis LunchGebre Mea Ozga, MD Phone: [phone]  Fax: [phone]   09/28/2016, 12:02 PM

## 2016-10-08 ENCOUNTER — Other Ambulatory Visit: Payer: Self-pay

## 2016-10-08 MED ORDER — INSULIN ASPART 100 UNIT/ML FLEXPEN: 5.0000 [IU] | PEN_INJECTOR | Freq: Three times a day (TID) | SUBCUTANEOUS | 2 refills | Status: DC

## 2016-10-15 ENCOUNTER — Telehealth: Payer: Self-pay | Admitting: Nutrition

## 2016-10-15 ENCOUNTER — Ambulatory Visit: Payer: Self-pay | Admitting: Nutrition

## 2016-10-15 NOTE — Telephone Encounter (Signed)
vm to call and reschedule appt or come in early due to the weather.

## 2016-10-31 ENCOUNTER — Other Ambulatory Visit: Payer: Self-pay | Admitting: "Endocrinology

## 2016-10-31 LAB — COMPREHENSIVE METABOLIC PANEL
ALBUMIN: 4 g/dL (ref 3.6–5.1)
ALT: 8 U/L (ref 6–29)
AST: 9 U/L — AB (ref 10–30)
Alkaline Phosphatase: 53 U/L (ref 33–115)
BUN: 15 mg/dL (ref 7–25)
CALCIUM: 9.2 mg/dL (ref 8.6–10.2)
CHLORIDE: 102 mmol/L (ref 98–110)
CO2: 23 mmol/L (ref 20–31)
Creat: 0.62 mg/dL (ref 0.50–1.10)
GLUCOSE: 356 mg/dL — AB (ref 65–99)
POTASSIUM: 4.5 mmol/L (ref 3.5–5.3)
Sodium: 134 mmol/L — ABNORMAL LOW (ref 135–146)
Total Bilirubin: 0.4 mg/dL (ref 0.2–1.2)
Total Protein: 7 g/dL (ref 6.1–8.1)

## 2016-11-01 LAB — HEMOGLOBIN A1C
Hgb A1c MFr Bld: 9.8 % — ABNORMAL HIGH (ref <5.7)
MEAN PLASMA GLUCOSE: 235 mg/dL

## 2016-11-09 ENCOUNTER — Encounter: Payer: Self-pay | Admitting: "Endocrinology

## 2016-11-09 ENCOUNTER — Ambulatory Visit (INDEPENDENT_AMBULATORY_CARE_PROVIDER_SITE_OTHER): Payer: Medicaid Other | Admitting: "Endocrinology

## 2016-11-09 VITALS — BP 113/72 | HR 74 | Ht 62.0 in | Wt 111.0 lb

## 2016-11-09 DIAGNOSIS — IMO0002 Reserved for concepts with insufficient information to code with codable children: Secondary | ICD-10-CM

## 2016-11-09 DIAGNOSIS — E108 Type 1 diabetes mellitus with unspecified complications: Secondary | ICD-10-CM

## 2016-11-09 DIAGNOSIS — E1065 Type 1 diabetes mellitus with hyperglycemia: Secondary | ICD-10-CM

## 2016-11-09 NOTE — Progress Notes (Signed)
Subjective:    Patient ID: Traci Ellison, female    DOB: May 07, 1986. Patient is being seen in consultation for management of diabetes requested by  House, Eugenio Hoes, FNP  Past Medical History:  Diagnosis Date  . Anxiety    8 months ago  . Depression    8 months ago  . Diabetes mellitus    type 1--since age 31  . Headache(784.0)    migraines, last 3 days ago-occurs occ.  Marland Kitchen History of kidney stones    x 1    Past Surgical History:  Procedure Laterality Date  . CERVICAL BIOPSY  W/ LOOP ELECTRODE EXCISION    . childbirth      x2 -last 3 months ago-[facility]  . 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 INDUCED ABORTION     Social History   Social History  . Marital status: Divorced    Spouse name: N/A  . Number of children: N/A  . Years of education: N/A   Social History Main Topics  . Smoking status: Current Every Day Smoker    Packs/day: 0.50    Years: 0.50    Types: Cigarettes  . Smokeless tobacco: Never Used  . Alcohol use No  . Drug use: No  . Sexual activity: Yes    Birth control/ protection: Condom   Other Topics Concern  . None   Social History Narrative  . None   Outpatient Encounter Prescriptions as of 11/09/2016  Medication Sig  . cyclobenzaprine (FLEXERIL) 10 MG tablet Take 10 mg by mouth 3 (three) times daily as needed for muscle spasms.  . insulin aspart (NOVOLOG FLEXPEN) 100 UNIT/ML FlexPen Inject 5-11 Units into the skin 3 (three) times daily with meals.  . Insulin Glargine (LANTUS SOLOSTAR) 100 UNIT/ML Solostar Pen Inject 30 Units into the skin daily at 10 pm.  . Insulin Pen Needle (BD PEN NEEDLE NANO U/F) 32G X 4 MM MISC 1 each by Does not apply route 4 (four) times daily.  . norethindrone (MICRONOR,CAMILA,ERRIN) 0.35 MG tablet Take 1 tablet by mouth daily.  . ranitidine (ZANTAC) 150  MG tablet Take 150 mg by mouth 3 (three) times daily as needed for heartburn.  . [DISCONTINUED] insulin lispro (HUMALOG KWIKPEN) 100 UNIT/ML KiwkPen Inject 0.05-0.11 mLs (5-11 Units total) into the skin 3 (three) times daily with meals.   No facility-administered encounter medications on file as of 11/09/2016.    ALLERGIES: Allergies  Allergen Reactions  . Diclofenac Hives and Itching  . Nsaids    VACCINATION STATUS:  There is no immunization history on file for this patient.  Diabetes  She presents for her follow-up diabetic visit. She has type 1 diabetes mellitus. Onset time: She was diagnosed at approximate age of 9 years. Disease course: After initial better engagement, she is stopped monitoring blood glucose for the last 90 days but insists that she continued her Lantus 30 units daily at bedtime. There are no hypoglycemic associated symptoms. Pertinent negatives for hypoglycemia include no confusion, headaches, pallor or seizures. Associated symptoms include blurred vision, fatigue, polydipsia and polyuria. Pertinent negatives for diabetes include no chest pain and no polyphagia. There are no hypoglycemic complications. Symptoms are improving. Risk factors for coronary artery disease include diabetes mellitus, tobacco exposure and family history. Current diabetic treatments: She is on Lantus 40 units nightly and  Humalog sliding scale. Her weight is increasing steadily. She is following a generally unhealthy diet. When asked about meal planning, she reported none. She has not had a previous visit with a dietitian. She participates in exercise intermittently. Her home blood glucose trend is decreasing steadily. (She does not monitor at all in the last 30 days.) An ACE inhibitor/angiotensin II receptor blocker is not being taken. She does not see a podiatrist.Eye exam is not current.       Review of Systems  Constitutional: Positive for fatigue and unexpected weight change. Negative for chills  and fever.  HENT: Negative for trouble swallowing and voice change.   Eyes: Positive for blurred vision. Negative for visual disturbance.  Respiratory: Negative for cough, shortness of breath and wheezing.   Cardiovascular: Negative for chest pain, palpitations and leg swelling.  Gastrointestinal: Negative for diarrhea, nausea and vomiting.  Endocrine: Positive for polydipsia and polyuria. Negative for cold intolerance, heat intolerance and polyphagia.  Musculoskeletal: Negative for arthralgias and myalgias.  Skin: Negative for color change, pallor, rash and wound.  Neurological: Negative for seizures and headaches.  Psychiatric/Behavioral: Negative for confusion and suicidal ideas.    Objective:    BP 113/72   Pulse 74   Ht  (1.575 m)   Wt 111 lb (50.3 kg)   BMI 20.30 kg/m   Wt Readings from Last 3 Encounters:  11/09/16 111 lb (50.3 kg)  09/28/16 112 lb (50.8 kg)  09/21/16 106 lb (48.1 kg)    Physical Exam  Constitutional: She is oriented to person, place, and time.  Light build with significant body weight deficit.  HENT:  Head: Normocephalic and atraumatic.  Poor dental condition.  Eyes: EOM are normal.  Neck: Normal range of motion. Neck supple. No tracheal deviation present. No thyromegaly present.  Cardiovascular: Normal rate and regular rhythm.   Pulmonary/Chest: Effort normal and breath sounds normal.  Abdominal: Soft. Bowel sounds are normal. There is no tenderness. There is no guarding.  Musculoskeletal: Normal range of motion. She exhibits no edema.  Neurological: She is alert and oriented to person, place, and time. She has normal reflexes. No cranial nerve deficit. Coordination normal.  Skin: Skin is warm and dry. No rash noted. No erythema. No pallor.  Psychiatric: She has a normal mood and affect. Judgment normal.    CMP     Component Value Date/Time   NA 134 (L) 10/31/2016 1101   K 4.5 10/31/2016 1101   CL 102 10/31/2016 1101   CO2 23 10/31/2016  1101   GLUCOSE 356 (H) 10/31/2016 1101   BUN 15 10/31/2016 1101   CREATININE 0.62 10/31/2016 1101   CALCIUM 9.2 10/31/2016 1101   PROT 7.0 10/31/2016 1101   ALBUMIN 4.0 10/31/2016 1101   AST 9 (L) 10/31/2016 1101   ALT 8 10/31/2016 1101   ALKPHOS 53 10/31/2016 1101   BILITOT 0.4 10/31/2016 1101   GFRNONAA >90 03/31/2013 1045   GFRAA >90 03/31/2013 1045     Diabetic Labs (most recent): Lab Results  Component Value Date   HGBA1C 9.8 (H) 10/31/2016   HGBA1C 11.2 07/24/2016   HGBA1C 12.8 11/26/2015     Lipid Panel ( most recent) Lipid Panel     Component Value Date/Time   CHOL 59 11/26/2015   TRIG 88 11/26/2015   HDL 59 11/26/2015   LDLCALC 87 11/26/2015     Assessment & Plan:   1. Uncontrolled type 1 diabetes mellitus with complication [facility])  - Patient has currently uncontrolled  symptomatic type 1 DM since  31 years of age. -  She came with significant improvement in her blood glucose profile since last visit. She has gained 6 pounds which is a good development for her.   -Her most recent A1c  Is 9.8%, slowly improving from 12.8%.  Recent labs reviewed, showing normal renal function.  - She did not report gross complications however she remains at a high risk for more acute and chronic complications of diabetes which include CAD, CVA, CKD, retinopathy, and neuropathy. These are all discussed in detail with the patient.  - I have counseled the patient on diet management, by adopting a carbohydrate restricted/protein rich diet.  - Suggestion is made for patient to avoid simple carbohydrates   from their diet including Cakes , Desserts, Ice Cream,  Soda (  diet and regular) , Sweet Tea , Candies,  Chips, Cookies, Artificial Sweeteners,   and "Sugar-free" Products .  However, she is allowed to introduce more complex carbs including whole-wheat/multigrain bread, rice, and pasta. This will help patient to have stable blood glucose profile . - I encouraged the patient to switch  to  unprocessed or minimally processed complex starch and increased protein intake (animal or plant source), fruits, and vegetables.  - Patient is advised to stick to a routine mealtimes to eat 3 meals  a day and avoid unnecessary snacks ( to snack only to correct hypoglycemia).  - The patient will be scheduled with Norm Salt, RDN, CDE for individualized DM education.  - I have approached patient with the following individualized plan to manage diabetes and patient agrees:   - She has so many social problems distracting her from taking care of herself. - Unfortunately, her audio option to treat type 1 diabetes is by structured daily routine of monitoring blood glucose , timely consumption of her meals coupled with proper administration of insulin. - I had a long discussion with her regarding this care. She promises to do better. - I  Will continue  her basal insulin Lantus  30 units daily at bedtime,  prandial insulin with Novolog 5 units 3 times a day before meals for pre-meal BG readings of 90-150mg /dl, plus patient specific correction dose for unexpected hyperglycemia above /dl, associated with strict monitoring of glucose  AC and HS. - Patient is warned not to take insulin without proper monitoring per orders. -Adjustment parameters are given for hypo and hyperglycemia in writing. -Patient is encouraged to call clinic for blood glucose levels less than 70 or above 300 mg /dl. -Insulin is a exclusive choice of therapy for her. - She is not a candidate for incretin therapy ,  metformin, SGLT2 inhibitors. - Patient specific target  A1c;  LDL, HDL, Triglycerides, and  Waist Circumference were discussed in detail.  2) BP/HTN:  controlled.  She is not on any medications currently. 3) Lipids/HPL:  controlled.  Currently, she is not on  statins. 4)  Weight/Diet: She would benefit from some more weight gain- gained 6 pounds since last visit, CDE Consult will be initiated , exercise, and  detailed carbohydrates information provided.  5) Chronic Care/Health Maintenance:  -Patient is encouraged to continue to follow up with Ophthalmology, dentist ,  Podiatrist at least yearly or according to recommendations, and advised to  quit smoking. I have recommended yearly flu vaccine and pneumonia vaccination at least every 5 years; moderate intensity exercise for up to 150 minutes weekly; and  sleep for at least 7 hours a day.  - 30  minutes of time was spent on the care of this patient , 50% of which was applied for counseling on diabetes complications and their preventions.  - Patient to bring meter and  blood glucose logs during her next visit.   - I advised patient to maintain close follow up with House, Eugenio Hoes, FNP for primary care needs.  Follow up plan: - Return in about 2 weeks (around 11/23/2016) for follow up with meter and logs- no labs.  Marquis Lunch, MD Phone: [phone]  Fax: [phone]   11/09/2016, 10:55 AM

## 2016-11-30 ENCOUNTER — Encounter: Payer: Self-pay | Admitting: "Endocrinology

## 2016-11-30 ENCOUNTER — Ambulatory Visit (INDEPENDENT_AMBULATORY_CARE_PROVIDER_SITE_OTHER): Payer: Medicaid Other | Admitting: "Endocrinology

## 2016-11-30 VITALS — BP 113/76 | HR 105 | Ht 62.0 in | Wt 110.0 lb

## 2016-11-30 DIAGNOSIS — E108 Type 1 diabetes mellitus with unspecified complications: Secondary | ICD-10-CM | POA: Diagnosis not present

## 2016-11-30 DIAGNOSIS — Z9119 Patient's noncompliance with other medical treatment and regimen: Secondary | ICD-10-CM

## 2016-11-30 DIAGNOSIS — F17200 Nicotine dependence, unspecified, uncomplicated: Secondary | ICD-10-CM

## 2016-11-30 DIAGNOSIS — Z91199 Patient's noncompliance with other medical treatment and regimen due to unspecified reason: Secondary | ICD-10-CM

## 2016-11-30 DIAGNOSIS — E1065 Type 1 diabetes mellitus with hyperglycemia: Secondary | ICD-10-CM

## 2016-11-30 DIAGNOSIS — IMO0002 Reserved for concepts with insufficient information to code with codable children: Secondary | ICD-10-CM

## 2016-11-30 NOTE — Progress Notes (Signed)
Subjective:    Patient ID: Traci Ellison, female    DOB: [birth year]. Patient is being seen in consultation for management of diabetes requested by  House, Eugenio Hoes, FNP  Past Medical History:  Diagnosis Date  . Anxiety    8 months ago  . Depression    8 months ago  . Diabetes mellitus    type 1--since age 31  . Headache(784.0)    migraines, last 3 days ago-occurs occ.  Marland Kitchen History of kidney stones    x 1    Past Surgical History:  Procedure Laterality Date  . CERVICAL BIOPSY  W/ LOOP ELECTRODE EXCISION    . childbirth      x2 -last 3 months ago-[facility]  . 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 INDUCED ABORTION     Social History   Social History  . Marital status: Divorced    Spouse name: N/A  . Number of children: N/A  . Years of education: N/A   Social History Main Topics  . Smoking status: Current Every Day Smoker    Packs/day: 0.50    Years: 0.50    Types: Cigarettes  . Smokeless tobacco: Never Used  . Alcohol use No  . Drug use: No  . Sexual activity: Yes    Birth control/ protection: Condom   Other Topics Concern  . None   Social History Narrative  . None   Outpatient Encounter Prescriptions as of 11/30/2016  Medication Sig  . cyclobenzaprine (FLEXERIL) 10 MG tablet Take 10 mg by mouth 3 (three) times daily as needed for muscle spasms.  . insulin aspart (NOVOLOG FLEXPEN) 100 UNIT/ML FlexPen Inject 5-11 Units into the skin 3 (three) times daily with meals.  . Insulin Glargine (LANTUS SOLOSTAR) 100 UNIT/ML Solostar Pen Inject 30 Units into the skin daily at 10 pm.  . Insulin Pen Needle (BD PEN NEEDLE NANO U/F) 32G X 4 MM MISC 1 each by Does not apply route 4 (four) times daily.  . norethindrone (MICRONOR,CAMILA,ERRIN) 0.35 MG tablet Take 1 tablet by mouth daily.  . ranitidine (ZANTAC)  150 MG tablet Take 150 mg by mouth 3 (three) times daily as needed for heartburn.   No facility-administered encounter medications on file as of 11/30/2016.    ALLERGIES: Allergies  Allergen Reactions  . Diclofenac Hives and Itching  . Nsaids    VACCINATION STATUS:  There is no immunization history on file for this patient.  Diabetes  She presents for her follow-up diabetic visit. She has type 1 diabetes mellitus. Onset time: She was diagnosed at approximate age of 9 years. Her disease course has been worsening (After initial better engagement, she is stopped monitoring blood glucose for the last 90 days but insists that she continued her Lantus 30 units daily at bedtime.). There are no hypoglycemic associated symptoms. Pertinent negatives for hypoglycemia include no confusion, headaches, pallor or seizures. Associated symptoms include blurred vision, fatigue, polydipsia and polyuria. Pertinent negatives for diabetes include no chest pain and no polyphagia. There are no hypoglycemic complications. Symptoms are worsening. Risk factors for coronary artery disease include diabetes mellitus, tobacco exposure and family history. Current diabetic treatments: She is on Lantus 40 units nightly and Humalog sliding scale. Her weight is stable. She is following a generally unhealthy diet. When asked about meal planning, she reported  none. She has not had a previous visit with a dietitian. She participates in exercise intermittently. There is no compliance with monitoring of blood glucose. There is no change in her home blood glucose trend. Her overall blood glucose range is >200 mg/dl. (She monitored only 7 times in the last 30 days, recommended 120 times.) An ACE inhibitor/angiotensin II receptor blocker is not being taken. She does not see a podiatrist.Eye exam is not current.       Review of Systems  Constitutional: Positive for fatigue and unexpected weight change. Negative for chills and fever.   HENT: Negative for trouble swallowing and voice change.   Eyes: Positive for blurred vision. Negative for visual disturbance.  Respiratory: Negative for cough, shortness of breath and wheezing.   Cardiovascular: Negative for chest pain, palpitations and leg swelling.  Gastrointestinal: Negative for diarrhea, nausea and vomiting.  Endocrine: Positive for polydipsia and polyuria. Negative for cold intolerance, heat intolerance and polyphagia.  Musculoskeletal: Negative for arthralgias and myalgias.  Skin: Negative for color change, pallor, rash and wound.  Neurological: Negative for seizures and headaches.  Psychiatric/Behavioral: Negative for confusion and suicidal ideas.    Objective:    BP 113/76   Pulse (!) 105   Ht  (1.575 m)   Wt 110 lb (49.9 kg)   BMI 20.12 kg/m   Wt Readings from Last 3 Encounters:  11/30/16 110 lb (49.9 kg)  11/09/16 111 lb (50.3 kg)  09/28/16 112 lb (50.8 kg)    Physical Exam  Constitutional: She is oriented to person, place, and time.  Light build with significant body weight deficit.  HENT:  Head: Normocephalic and atraumatic.  Poor dental condition.  Eyes: EOM are normal.  Neck: Normal range of motion. Neck supple. No tracheal deviation present. No thyromegaly present.  Cardiovascular: Normal rate and regular rhythm.   Pulmonary/Chest: Effort normal and breath sounds normal.  Abdominal: Soft. Bowel sounds are normal. There is no tenderness. There is no guarding.  Musculoskeletal: Normal range of motion. She exhibits no edema.  Neurological: She is alert and oriented to person, place, and time. She has normal reflexes. No cranial nerve deficit. Coordination normal.  Skin: Skin is warm and dry. No rash noted. No erythema. No pallor.  Psychiatric: She has a normal mood and affect. Judgment normal.    CMP     Component Value Date/Time   NA 134 (L) 10/31/2016 1101   K 4.5 10/31/2016 1101   CL 102 10/31/2016 1101   CO2 23 10/31/2016 1101    GLUCOSE 356 (H) 10/31/2016 1101   BUN 15 10/31/2016 1101   CREATININE 0.62 10/31/2016 1101   CALCIUM 9.2 10/31/2016 1101   PROT 7.0 10/31/2016 1101   ALBUMIN 4.0 10/31/2016 1101   AST 9 (L) 10/31/2016 1101   ALT 8 10/31/2016 1101   ALKPHOS 53 10/31/2016 1101   BILITOT 0.4 10/31/2016 1101   GFRNONAA >90 03/31/2013 1045   GFRAA >90 03/31/2013 1045     Diabetic Labs (most recent): Lab Results  Component Value Date   HGBA1C 9.8 (H) 10/31/2016   HGBA1C 11.2 07/24/2016   HGBA1C 12.8 11/26/2015     Lipid Panel ( most recent) Lipid Panel     Component Value Date/Time   CHOL 59 11/26/2015   TRIG 88 11/26/2015   HDL 59 11/26/2015   LDLCALC 87 11/26/2015     Assessment & Plan:   1. Uncontrolled type 1 diabetes mellitus with complication [facility])  - Patient has currently uncontrolled  symptomatic type 1 DM since  31 years of age. -  She  Remains alarmingly non-compliant, did not engage for proper monitoring for safe use of insulin.    -Her most recent A1c  is 9.8%, slowly improving from 12.8%.  Recent labs reviewed, showing normal renal function.  - She did not report gross complications however she remains at a high risk for more acute and chronic complications of diabetes which include CAD, CVA, CKD, retinopathy, and neuropathy. These are all discussed in detail with the patient.  - I have counseled the patient on diet management, by adopting a carbohydrate restricted/protein rich diet.  - Suggestion is made for patient to avoid simple carbohydrates   from their diet including Cakes , Desserts, Ice Cream,  Soda (  diet and regular) , Sweet Tea , Candies,  Chips, Cookies, Artificial Sweeteners,   and "Sugar-free" Products .  However, she is allowed to introduce more complex carbs including whole-wheat/multigrain bread, rice, and pasta. This will help patient to have stable blood glucose profile . - I encouraged the patient to switch to  unprocessed or minimally processed complex  starch and increased protein intake (animal or plant source), fruits, and vegetables.  - Patient is advised to stick to a routine mealtimes to eat 3 meals  a day and avoid unnecessary snacks ( to snack only to correct hypoglycemia).  - The patient will be scheduled with Norm Salt, RDN, CDE for individualized DM education.  - I have approached patient with the following individualized plan to manage diabetes and patient agrees:   - She has so many social problems distracting her from taking care of herself, but unfortunately she is not making even a small effort. - Unfortunately, her only option to treat type 1 diabetes is by structured daily routine of monitoring blood glucose , timely consumption of her meals coupled with proper administration of insulin. - I had a long discussion with her regarding this care. She promises to do better, but came back with same behavior. - I  Can only continue  her basal insulin Lantus  30 units daily at bedtime,  prandial insulin with Novolog 5 units 3 times a day before meals for pre-meal BG readings of 90-150mg /dl, plus patient specific correction dose for unexpected hyperglycemia above /dl, associated with strict monitoring of glucose  AC and HS. - Patient is warned not to take insulin without proper monitoring per orders. -Adjustment parameters are given for hypo and hyperglycemia in writing. -Patient is encouraged to call clinic for blood glucose levels less than 70 or above 300 mg /dl. -Insulin is a exclusive choice of therapy for her. - She is not a candidate for incretin therapy ,  metformin, SGLT2 inhibitors. - Patient specific target  A1c;  LDL, HDL, Triglycerides, and  Waist Circumference were discussed in detail.  2) BP/HTN:  controlled.  She is not on any medications currently. 3) Lipids/HPL:  controlled.  Currently, she is not on  statins. 4)  Weight/Diet: She would benefit from some more weight gain- CDE Consult will be initiated ,  exercise, and detailed carbohydrates information provided.  5) Chronic Care/Health Maintenance:  -Patient is encouraged to continue to follow up with Ophthalmology, dentist ,  Podiatrist at least yearly or according to recommendations, and advised to  quit smoking. I have recommended yearly flu vaccine and pneumonia vaccination at least every 5 years; moderate intensity exercise for up to 150 minutes weekly; and  sleep for at least 7 hours a day.  -  30 minutes of time was spent on the care of this patient , 50% of which was applied for counseling on diabetes complications and their preventions.  - Patient to bring meter and  blood glucose logs during her next visit.   - I advised patient to maintain close follow up with House, Eugenio Hoes, FNP for primary care needs.  Follow up plan: - Return in about 3 months (around 03/01/2017) for meter, and logs.  Marquis Lunch, MD Phone: [phone]  Fax: [phone]   11/30/2016, 11:26 AM

## 2017-01-24 ENCOUNTER — Other Ambulatory Visit (HOSPITAL_COMMUNITY): Payer: Self-pay | Admitting: Chiropractic Medicine

## 2017-01-24 DIAGNOSIS — M5430 Sciatica, unspecified side: Secondary | ICD-10-CM

## 2017-01-29 ENCOUNTER — Ambulatory Visit (HOSPITAL_COMMUNITY)
Admission: RE | Admit: 2017-01-29 | Discharge: 2017-01-29 | Disposition: A | Discharge status: Home / Self Care | Payer: Medicaid Other | Source: Ambulatory Visit | Attending: Chiropractic Medicine | Admitting: Chiropractic Medicine

## 2017-01-29 ENCOUNTER — Other Ambulatory Visit: Payer: Self-pay | Admitting: "Endocrinology

## 2017-01-29 DIAGNOSIS — M5430 Sciatica, unspecified side: Secondary | ICD-10-CM

## 2017-01-29 DIAGNOSIS — M545 Low back pain: Secondary | ICD-10-CM | POA: Insufficient documentation

## 2017-01-30 ENCOUNTER — Other Ambulatory Visit: Payer: Self-pay

## 2017-01-30 MED ORDER — INSULIN LISPRO 100 UNIT/ML (KWIKPEN): 5.0000 [IU] | PEN_INJECTOR | Freq: Three times a day (TID) | SUBCUTANEOUS | 2 refills | Status: DC

## 2017-02-08 ENCOUNTER — Other Ambulatory Visit: Payer: Self-pay

## 2017-02-08 MED ORDER — INSULIN LISPRO 100 UNIT/ML (KWIKPEN): 5.0000 [IU] | PEN_INJECTOR | Freq: Three times a day (TID) | SUBCUTANEOUS | 2 refills | Status: DC

## 2017-02-28 LAB — HEMOGLOBIN A1C: HEMOGLOBIN A1C: 9

## 2017-03-05 ENCOUNTER — Ambulatory Visit: Payer: Medicaid Other | Admitting: "Endocrinology

## 2017-03-11 ENCOUNTER — Ambulatory Visit: Payer: Medicaid Other | Admitting: "Endocrinology

## 2017-03-20 ENCOUNTER — Encounter: Payer: Self-pay | Admitting: "Endocrinology

## 2017-03-20 ENCOUNTER — Ambulatory Visit (INDEPENDENT_AMBULATORY_CARE_PROVIDER_SITE_OTHER): Payer: Medicaid Other | Admitting: "Endocrinology

## 2017-03-20 VITALS — BP 98/64 | HR 94 | Ht 62.0 in | Wt 103.0 lb

## 2017-03-20 DIAGNOSIS — IMO0002 Reserved for concepts with insufficient information to code with codable children: Secondary | ICD-10-CM

## 2017-03-20 DIAGNOSIS — F17200 Nicotine dependence, unspecified, uncomplicated: Secondary | ICD-10-CM | POA: Diagnosis not present

## 2017-03-20 DIAGNOSIS — Z9119 Patient's noncompliance with other medical treatment and regimen: Secondary | ICD-10-CM

## 2017-03-20 DIAGNOSIS — E1065 Type 1 diabetes mellitus with hyperglycemia: Secondary | ICD-10-CM | POA: Diagnosis not present

## 2017-03-20 DIAGNOSIS — Z3A13 13 weeks gestation of pregnancy: Secondary | ICD-10-CM

## 2017-03-20 DIAGNOSIS — E108 Type 1 diabetes mellitus with unspecified complications: Secondary | ICD-10-CM

## 2017-03-20 DIAGNOSIS — Z91199 Patient's noncompliance with other medical treatment and regimen due to unspecified reason: Secondary | ICD-10-CM | POA: Insufficient documentation

## 2017-03-20 MED ORDER — INSULIN ASPART 100 UNIT/ML FLEXPEN: 8.0000 [IU] | PEN_INJECTOR | Freq: Three times a day (TID) | SUBCUTANEOUS | 2 refills | Status: DC

## 2017-03-20 MED ORDER — INSULIN DETEMIR 100 UNIT/ML FLEXPEN: 25.0000 [IU] | PEN_INJECTOR | Freq: Every day | SUBCUTANEOUS | 2 refills | Status: DC

## 2017-03-20 MED ORDER — GLUCOSE BLOOD VI STRP: ORAL_STRIP | 5 refills | Status: DC

## 2017-03-20 NOTE — Progress Notes (Signed)
Subjective:    Patient ID: Traci Ellison, female    DOB: 1986-05-27. Patient is being seen in f/u for management of diabetes requested by  House, Eugenio Hoes, FNP  Past Medical History:  Diagnosis Date  . Anxiety    8 months ago  . Depression    8 months ago  . Diabetes mellitus    type 1--since age 31  . Headache(784.0)    migraines, last 3 days ago-occurs occ.  Marland Kitchen History of kidney stones    x 1    Past Surgical History:  Procedure Laterality Date  . CERVICAL BIOPSY  W/ LOOP ELECTRODE EXCISION    . childbirth      x2 -last 3 months ago-[facility]  . 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 INDUCED ABORTION     Social History   Social History  . Marital status: Divorced    Spouse name: N/A  . Number of children: N/A  . Years of education: N/A   Social History Main Topics  . Smoking status: Current Every Day Smoker    Packs/day: 0.50    Years: 0.50    Types: Cigarettes  . Smokeless tobacco: Never Used  . Alcohol use No  . Drug use: No  . Sexual activity: Yes    Birth control/ protection: Condom   Other Topics Concern  . None   Social History Narrative  . None   Outpatient Encounter Prescriptions as of 03/20/2017  Medication Sig  . cyclobenzaprine (FLEXERIL) 10 MG tablet Take 10 mg by mouth 3 (three) times daily as needed for muscle spasms.  Marland Kitchen glucose blood (ACCU-CHEK ACTIVE STRIPS) test strip Use as instructed  At least 7x daily  . insulin aspart (NOVOLOG FLEXPEN) 100 UNIT/ML FlexPen Inject 8-14 Units into the skin 3 (three) times daily with meals.  . Insulin Detemir (LEVEMIR FLEXTOUCH) 100 UNIT/ML Pen Inject 25 Units into the skin daily at 10 pm.  . Insulin Pen Needle (BD PEN NEEDLE NANO U/F) 32G X 4 MM MISC 1 each by Does not apply route 4 (four) times daily.  . [DISCONTINUED] Insulin Glargine  (LANTUS SOLOSTAR) 100 UNIT/ML Solostar Pen Inject 30 Units into the skin daily at 10 pm.  . [DISCONTINUED] insulin lispro (HUMALOG KWIKPEN) 100 UNIT/ML KiwkPen Inject 0.05-0.11 mLs (5-11 Units total) into the skin 3 (three) times daily.  . [DISCONTINUED] norethindrone (MICRONOR,CAMILA,ERRIN) 0.35 MG tablet Take 1 tablet by mouth daily.  . [DISCONTINUED] NOVOLOG FLEXPEN 100 UNIT/ML FlexPen INJECT 5 TO 11 UNITS SUBCUTANEOUSLY THREE TIMES DAILY WITH MEALS  . [DISCONTINUED] ranitidine (ZANTAC) 150 MG tablet Take 150 mg by mouth 3 (three) times daily as needed for heartburn.   No facility-administered encounter medications on file as of 03/20/2017.    ALLERGIES: Allergies  Allergen Reactions  . Diclofenac Hives and Itching  . Nsaids    VACCINATION STATUS:  There is no immunization history on file for this patient.  Diabetes  She presents for her follow-up diabetic visit. She has type 1 diabetes mellitus. Onset time: She was diagnosed at approximate age of 9 years. Her disease course has been worsening (She did not keep her last appointment, recently confirmed pregnancy at 13 weeks while her A1c is still high at 9%, and interested to resume her diabetes care.). There are no hypoglycemic associated symptoms. Pertinent negatives for  hypoglycemia include no confusion, headaches, pallor or seizures. Associated symptoms include blurred vision, fatigue, polydipsia and polyuria. Pertinent negatives for diabetes include no chest pain and no polyphagia. There are no hypoglycemic complications. Symptoms are worsening. Risk factors for coronary artery disease include diabetes mellitus, tobacco exposure and family history. Current diabetic treatments: She is on Lantus 30 units nightly and NovoLog 5 units 3 times a day before meals plus sliding scale. Her weight is increasing steadily (She reports losing weight as low as 97 and currently regaining up to 103 pounds.). She is following a generally unhealthy diet. When  asked about meal planning, she reported none. She has not had a previous visit with a dietitian. She participates in exercise intermittently. Her home blood glucose trend is fluctuating dramatically. Her breakfast blood glucose range is generally 70-90 mg/dl. Her lunch blood glucose range is generally 140-180 mg/dl. Her dinner blood glucose range is generally 140-180 mg/dl. Her bedtime blood glucose range is generally 140-180 mg/dl. Her overall blood glucose range is 140-180 mg/dl. (She monitored only 7 times in the last 30 days, recommended 120 times.) An ACE inhibitor/angiotensin II receptor blocker is not being taken. She does not see a podiatrist.Eye exam is not current.     Review of Systems  Constitutional: Positive for fatigue and unexpected weight change. Negative for chills and fever.  HENT: Negative for trouble swallowing and voice change.   Eyes: Positive for blurred vision. Negative for visual disturbance.  Respiratory: Negative for cough, shortness of breath and wheezing.   Cardiovascular: Negative for chest pain, palpitations and leg swelling.  Gastrointestinal: Negative for diarrhea, nausea and vomiting.  Endocrine: Positive for polydipsia and polyuria. Negative for cold intolerance, heat intolerance and polyphagia.  Musculoskeletal: Negative for arthralgias and myalgias.  Skin: Negative for color change, pallor, rash and wound.  Neurological: Negative for seizures and headaches.  Psychiatric/Behavioral: Negative for confusion and suicidal ideas.    Objective:    BP 98/64   Pulse 94   Ht 5\' 2"  (1.575 m)   Wt 103 lb (46.7 kg)   BMI 18.84 kg/m   Wt Readings from Last 3 Encounters:  03/20/17 103 lb (46.7 kg)  11/30/16 110 lb (49.9 kg)  11/09/16 111 lb (50.3 kg)    Physical Exam  Constitutional: She is oriented to person, place, and time.  Light build with significant body weight deficit.  HENT:  Head: Normocephalic and atraumatic.  Poor dental condition.  Eyes: EOM  are normal.  Neck: Normal range of motion. Neck supple. No tracheal deviation present. No thyromegaly present.  Cardiovascular: Normal rate and regular rhythm.   Pulmonary/Chest: Effort normal and breath sounds normal.  Abdominal: Soft. Bowel sounds are normal. There is no tenderness. There is no guarding.  Musculoskeletal: Normal range of motion. She exhibits no edema.  Neurological: She is alert and oriented to person, place, and time. She has normal reflexes. No cranial nerve deficit. Coordination normal.  Skin: Skin is warm and dry. No rash noted. No erythema. No pallor.  Psychiatric: She has a normal mood and affect. Judgment normal.    CMP     Component Value Date/Time   NA 134 (L) 10/31/2016 1101   K 4.5 10/31/2016 1101   CL 102 10/31/2016 1101   CO2 23 10/31/2016 1101   GLUCOSE 356 (H) 10/31/2016 1101   BUN 15 10/31/2016 1101   CREATININE 0.62 10/31/2016 1101   CALCIUM 9.2 10/31/2016 1101   PROT 7.0 10/31/2016 1101   ALBUMIN 4.0 10/31/2016 1101  AST 9 (L) 10/31/2016 1101   ALT 8 10/31/2016 1101   ALKPHOS 53 10/31/2016 1101   BILITOT 0.4 10/31/2016 1101   GFRNONAA >90 03/31/2013 1045   GFRAA >90 03/31/2013 1045     Diabetic Labs (most recent): Lab Results  Component Value Date   HGBA1C 9 02/28/2017   HGBA1C 9.8 (H) 10/31/2016   HGBA1C 11.2 07/24/2016     Lipid Panel ( most recent) Lipid Panel     Component Value Date/Time   CHOL 59 11/26/2015   TRIG 88 11/26/2015   HDL 59 11/26/2015   LDLCALC 87 11/26/2015     Assessment & Plan:   1. Uncontrolled type 1 diabetes mellitus with complication [facility])  - Patient has currently uncontrolled symptomatic type 1 DM since  31 years of age. - She is known to have alarmingly non-compliance to medical recommendations in follow-ups. - She missed her appointment, and returns with led first trimester pregnancy at 13 weeks with A1c of 9%, resuming her basal bolus insulin - She did have A1c of 9.8% in March , 2018 and 11.2  % in December 2017.  - She did not report gross complications however she remains at a high risk for obstetric competitions of uncontrolled diabetes, and  more acute and chronic complications of diabetes which include CAD, CVA, CKD, retinopathy, and neuropathy. These are all discussed in detail with the patient.  - I have counseled the patient on diet management, by adopting a carbohydrate restricted/protein rich diet.  - Suggestion is made for patient to avoid simple carbohydrates   from her diet including Cakes , excessive  sweet  Desserts, Ice Cream,  Soda (  diet and regular) , Sweet Tea , Candies,  Chips, Cookies, Artificial Sweeteners,   and "Sugar-free" Products .  However, she is allowed to introduce more complex carbs including whole-wheat/multigrain bread, rice, and pasta. This will help patient to have stable blood glucose profile . - I encouraged the patient to switch to  unprocessed or minimally processed complex starch and increased protein intake (animal or plant source), fruits, and vegetables.  - Patient is advised to stick to a routine mealtimes to eat 3 meals  a day and avoid unnecessary snacks ( to snack only to correct hypoglycemia).   - I have approached patient with the following individualized plan to manage diabetes and patient agrees:   - She has so many social problems distracting her from taking care of herself. - She is now pregnant at [redacted] weeks of gestation was prevailing A1c of 9%.  - I had a long discussion with her regarding this care.  -Her exclusive choice of therapy is still intensive insulin treatment. - She promises to do better. - I  will change her basal insulin from Lantus to Levemir at the lower dose of 25 units daily at bedtime ,  increase her prandial insulin NovoLog to 8 units  3 times a day before meals for pre-meal BG readings of 60-150mg /dl, plus patient specific correction dose for unexpected hyperglycemia above 150mg /dl, associated with strict  monitoring of glucose 7 times-before meals, 2 hours after meals, and at bedtime . - I discussed the goals of therapy with her: 60-90 mg/dL fasting and pre-meal blood glucose readings, and less than 120 mg/dL 2 hours after meals.  - Patient is warned not to take insulin without proper monitoring per orders. -Adjustment parameters are given for hypo and hyperglycemia in writing. -Patient is encouraged to call clinic for blood glucose levels less than  70 or above 200 mg /dl.  - Patient specific target  A1c;  LDL, HDL, Triglycerides, and  Waist Circumference were discussed in detail.  2) BP/HTN:  Controlled at 98/64.  She is not on any medications currently. 3) Lipids/HPL:  controlled.  Currently, she is not on  statins. 4)  Weight/Diet: She will need some more weight gain- CDE Consult has been  initiated , exercise, and detailed carbohydrates information provided.  5) Chronic Care/Health Maintenance:  -Patient is encouraged to continue to follow up with Ophthalmology, dentist ,  Podiatrist at least yearly or according to recommendations, and advised to  quit smoking. I have recommended yearly flu vaccine and pneumonia vaccination at least every 5 years; moderate intensity exercise for up to 150 minutes weekly; and  sleep for at least 7 hours a day.  - Time spent with the patient: 25 min, of which >50% was spent in reviewing her sugar logs , discussing her hypo- and hyper-glycemic episodes, reviewing  previous labs and insulin doses and developing a plan to avoid hypo- and hyper-glycemia.   - Patient to bring meter and  blood glucose logs during her next visit.  - I advised patient to maintain close follow up with House, Eugenio Hoes, FNP for prenatal  care needs.  Follow up plan: - Return in about 1 week (around 03/27/2017) for follow up with meter and logs- no labs.  Marquis Lunch, MD Phone: [phone]  Fax: [phone]   03/20/2017, 2:58 PM

## 2017-03-21 ENCOUNTER — Ambulatory Visit (HOSPITAL_COMMUNITY): Payer: Self-pay

## 2017-03-21 ENCOUNTER — Encounter: Payer: Medicaid Other | Attending: Unknown Physician Specialty | Admitting: *Deleted

## 2017-03-21 ENCOUNTER — Ambulatory Visit (HOSPITAL_COMMUNITY)
Admission: RE | Admit: 2017-03-21 | Discharge: 2017-03-21 | Disposition: A | Discharge status: Home / Self Care | Payer: Medicaid Other | Source: Ambulatory Visit | Attending: Unknown Physician Specialty | Admitting: Unknown Physician Specialty

## 2017-03-21 DIAGNOSIS — E1165 Type 2 diabetes mellitus with hyperglycemia: Secondary | ICD-10-CM | POA: Diagnosis not present

## 2017-03-21 DIAGNOSIS — E1065 Type 1 diabetes mellitus with hyperglycemia: Secondary | ICD-10-CM

## 2017-03-21 DIAGNOSIS — Z713 Dietary counseling and surveillance: Secondary | ICD-10-CM | POA: Diagnosis not present

## 2017-03-21 DIAGNOSIS — O24011 Pre-existing diabetes mellitus, type 1, in pregnancy, first trimester: Principal | ICD-10-CM

## 2017-03-21 NOTE — Progress Notes (Signed)
  Patient was seen on 03/21/2017 for Type 1 Diabetes with pregnancy self-management. Patient states history of DM 1 since she was 31 years old. She states her current insulin doses are 25 units Levemir at bedtime and 8 units Novolog at each meal with correction factor of 1 / 50 mg/dl above 161100 mg/dl. She has 2 daughters, 4411 and 31 years old. She states she wore a Medtronic 722 insulin pump with her two previous pregnancies. 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 She is complaining of nausea all day long which limits her ability to eat at a regular time or adequate portions. She states her comfort level with carb counting is a 2/10. She states she is testing her BG before each meal and 2 hours after each meal, and bedtime. She states she has hypoglycemia several times each week, typically in the middle of the night. She reports BG as low as 17 mg/dl this past week. Of note, her long acting insulin has been switched from Lantus to Levemir and she reports her dose has been reduced from 30 to 25 units at bedtime.   The following learning objectives were presented to the patient:   Physiology of Type 1 diabetes and relationship of insulin to carbohydrate intake  Rationale of correction dose for correction of high BG's as needed  Rationale of a consistent carb intake especially while on a set dose of meal time insulin  How to read a food label for total carbohydrate of foods for their serving size   Recommended use of [employer] Essentials powder to be mixed with milk as a potential meal replacement when unable to eat adequately.   Reinforced timing of BG checks   Reviewed the factors that affect BG including food, stress, activity and insulin  Provided some sample meals with appropriate carbohydrate content for pregnancy  Reviewed treatment for hypoglycemia using quick acting carbohydrate first and following with carb plus protein  Informed her of Type 1 Support [place] first Wednesday of every month.   Plan:  Aim for 2-3 Carb Choices per meal (30-45 grams)  Aim for 1-2 Carbs per snack Include lean protein with meals and snacks as tolerated Continue reading food labels for Total Carbohydrate of foods Continue with your activity level by walking or other activity daily as tolerated Continue checking BG before  and 2 hours after each meal Bring Log Book to every medical appointment   Take medication as directed by MD  BG at end of visit was 58 mg/dl. I offered her a soda with 22 grams carbohydrate in it and in 10 minutes her BG had risen to 83 mg/dl. Patient has PNB crackers with her to eat on her way home.  Pregnancy target glucose levels: FBS: 60 - 95 mg/dl 2 hour: <096<120 mg/dl  Patient received the following handouts:  Nutrition Diabetes and Pregnancy  Carbohydrate Counting List  Carb counting Meal Card  Food Label handout  Patient will be seen for follow-up as needed. There is an RD/CDE in Dr. Isidoro DonningNida's office that could be available for diabetes education follow up as well.

## 2017-03-28 ENCOUNTER — Encounter: Payer: Self-pay | Admitting: "Endocrinology

## 2017-03-28 ENCOUNTER — Ambulatory Visit (INDEPENDENT_AMBULATORY_CARE_PROVIDER_SITE_OTHER): Payer: Medicaid Other | Admitting: "Endocrinology

## 2017-03-28 VITALS — BP 98/64 | HR 105 | Ht 62.0 in | Wt 102.0 lb

## 2017-03-28 DIAGNOSIS — E108 Type 1 diabetes mellitus with unspecified complications: Secondary | ICD-10-CM

## 2017-03-28 DIAGNOSIS — Z3A14 14 weeks gestation of pregnancy: Secondary | ICD-10-CM | POA: Insufficient documentation

## 2017-03-28 DIAGNOSIS — IMO0002 Reserved for concepts with insufficient information to code with codable children: Secondary | ICD-10-CM

## 2017-03-28 DIAGNOSIS — E1065 Type 1 diabetes mellitus with hyperglycemia: Secondary | ICD-10-CM

## 2017-03-28 MED ORDER — INSULIN ASPART 100 UNIT/ML FLEXPEN: 5.0000 [IU] | PEN_INJECTOR | Freq: Three times a day (TID) | SUBCUTANEOUS | 2 refills | Status: DC

## 2017-03-28 MED ORDER — INSULIN DETEMIR 100 UNIT/ML FLEXPEN: 20.0000 [IU] | PEN_INJECTOR | Freq: Every day | SUBCUTANEOUS | 2 refills | Status: DC

## 2017-03-28 NOTE — Progress Notes (Signed)
Subjective:    Patient ID: Traci Ellison, female    DOB: November 19, 1985. Patient is being seen in f/u for management of diabetes requested by  House, Eugenio Hoes, FNP  Past Medical History:  Diagnosis Date  . Anxiety    8 months ago  . Depression    8 months ago  . Diabetes mellitus    type 1--since age 31  . Headache(784.0)    migraines, last 3 days ago-occurs occ.  Marland Kitchen History of kidney stones    x 1    Past Surgical History:  Procedure Laterality Date  . CERVICAL BIOPSY  W/ LOOP ELECTRODE EXCISION    . childbirth      x2 -last 3 months ago-[facility]  . 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 INDUCED ABORTION     Social History   Social History  . Marital status: Divorced    Spouse name: N/A  . Number of children: N/A  . Years of education: N/A   Social History Main Topics  . Smoking status: Current Every Day Smoker    Packs/day: 0.50    Years: 0.50    Types: Cigarettes  . Smokeless tobacco: Never Used  . Alcohol use No  . Drug use: No  . Sexual activity: Yes    Birth control/ protection: Condom   Other Topics Concern  . None   Social History Narrative  . None   Outpatient Encounter Prescriptions as of 03/28/2017  Medication Sig  . cyclobenzaprine (FLEXERIL) 10 MG tablet Take 10 mg by mouth 3 (three) times daily as needed for muscle spasms.  Marland Kitchen glucose blood (ACCU-CHEK ACTIVE STRIPS) test strip Use as instructed  At least 7x daily  . insulin aspart (NOVOLOG FLEXPEN) 100 UNIT/ML FlexPen Inject 8-14 Units into the skin 3 (three) times daily with meals.  . Insulin Detemir (LEVEMIR FLEXTOUCH) 100 UNIT/ML Pen Inject 25 Units into the skin daily at 10 pm.  . Insulin Pen Needle (BD PEN NEEDLE NANO U/F) 32G X 4 MM MISC 1 each by Does not apply route 4 (four) times daily.   No facility-administered encounter  medications on file as of 03/28/2017.    ALLERGIES: Allergies  Allergen Reactions  . Diclofenac Hives and Itching  . Nsaids    VACCINATION STATUS:  There is no immunization history on file for this patient.  Diabetes  She presents for her follow-up diabetic visit. She has type 1 diabetes mellitus. Onset time: She was diagnosed at approximate age of 9 years. Her disease course has been improving (She is now at 14 weeks of gestation, preconception A1c was high at 9%., Returns with tight readings of blood glucose mainly due to nausea/vomiting.). There are no hypoglycemic associated symptoms. Pertinent negatives for hypoglycemia include no confusion, headaches, pallor or seizures. Associated symptoms include blurred vision and fatigue. Pertinent negatives for diabetes include no chest pain, no polydipsia, no polyphagia and no polyuria. There are no hypoglycemic complications. Symptoms are worsening. Risk factors for coronary artery disease include diabetes mellitus, tobacco exposure and family history. Current diabetic treatments: She is on Lantus 30 units nightly and NovoLog 5 units 3 times a day before meals plus sliding scale. Her weight is increasing steadily (She has not gained any weight since last visit mainly due to her nausea/vomiting.). She is following a generally unhealthy diet.  When asked about meal planning, she reported none. She has not had a previous visit with a dietitian. She participates in exercise intermittently. Her home blood glucose trend is fluctuating dramatically. Her breakfast blood glucose range is generally 110-130 mg/dl. Her lunch blood glucose range is generally 70-90 mg/dl. Her dinner blood glucose range is generally 70-90 mg/dl. Her bedtime blood glucose range is generally 70-90 mg/dl. Her overall blood glucose range is 70-90 mg/dl. (She monitored only 7 times in the last 30 days, recommended 120 times.) An ACE inhibitor/angiotensin II receptor blocker is not being taken.  She does not see a podiatrist.Eye exam is not current.    Review of Systems  Constitutional: Positive for fatigue and unexpected weight change. Negative for chills and fever.  HENT: Negative for trouble swallowing and voice change.   Eyes: Positive for blurred vision. Negative for visual disturbance.  Respiratory: Negative for cough, shortness of breath and wheezing.   Cardiovascular: Negative for chest pain, palpitations and leg swelling.  Gastrointestinal: Negative for diarrhea, nausea and vomiting.  Endocrine: Negative for cold intolerance, heat intolerance, polydipsia, polyphagia and polyuria.  Musculoskeletal: Negative for arthralgias and myalgias.  Skin: Negative for color change, pallor, rash and wound.  Neurological: Negative for seizures and headaches.  Psychiatric/Behavioral: Negative for confusion and suicidal ideas.    Objective:    BP 98/64   Pulse (!) 105   Ht 5\' 2"  (1.575 m)   Wt 102 lb (46.3 kg)   BMI 18.66 kg/m   Wt Readings from Last 3 Encounters:  03/28/17 102 lb (46.3 kg)  03/20/17 103 lb (46.7 kg)  11/30/16 110 lb (49.9 kg)    Physical Exam  Constitutional: She is oriented to person, place, and time.  Light build with significant body weight deficit.  HENT:  Head: Normocephalic and atraumatic.  Poor dental condition.  Eyes: EOM are normal.  Neck: Normal range of motion. Neck supple. No tracheal deviation present. No thyromegaly present.  Cardiovascular: Normal rate and regular rhythm.   Pulmonary/Chest: Effort normal and breath sounds normal.  Abdominal: Soft. Bowel sounds are normal. There is no tenderness. There is no guarding.  Musculoskeletal: Normal range of motion. She exhibits no edema.  Neurological: She is alert and oriented to person, place, and time. She has normal reflexes. No cranial nerve deficit. Coordination normal.  Skin: Skin is warm and dry. No rash noted. No erythema. No pallor.  Psychiatric: She has a normal mood and affect.  Judgment normal.    CMP     Component Value Date/Time   NA 134 (L) 10/31/2016 1101   K 4.5 10/31/2016 1101   CL 102 10/31/2016 1101   CO2 23 10/31/2016 1101   GLUCOSE 356 (H) 10/31/2016 1101   BUN 15 10/31/2016 1101   CREATININE 0.62 10/31/2016 1101   CALCIUM 9.2 10/31/2016 1101   PROT 7.0 10/31/2016 1101   ALBUMIN 4.0 10/31/2016 1101   AST 9 (L) 10/31/2016 1101   ALT 8 10/31/2016 1101   ALKPHOS 53 10/31/2016 1101   BILITOT 0.4 10/31/2016 1101   GFRNONAA >90 03/31/2013 1045   GFRAA >90 03/31/2013 1045     Diabetic Labs (most recent): Lab Results  Component Value Date   HGBA1C 9 02/28/2017   HGBA1C 9.8 (H) 10/31/2016   HGBA1C 11.2 07/24/2016     Lipid Panel ( most recent) Lipid Panel     Component Value Date/Time   CHOL 59 11/26/2015   TRIG 88 11/26/2015   HDL 59 11/26/2015   LDLCALC  87 11/26/2015     Assessment & Plan:   1. Uncontrolled type 1 diabetes mellitus with complication [facility])  - Patient has currently uncontrolled symptomatic type 1 DM since  31 years of age. - She is known to have alarmingly non-compliance to medical recommendations in follow-ups. - She has first trimester pregnancy at 14 weeks with recent A1c of 9%. - She did have A1c of 9.8% in March , 2018 and 11.2 % in December 2017.  - She did not report gross complications however she remains at a high risk for obstetric competitions of uncontrolled diabetes, and  more acute and chronic complications of diabetes which include CAD, CVA, CKD, retinopathy, and neuropathy. These are all discussed in detail with the patient.  - I have counseled the patient on diet management, by adopting a carbohydrate restricted/protein rich diet.  -Suggestion is made for her to avoid simple carbohydrates  from her diet including Cakes, Sweet Desserts, Ice Cream, Soda (diet and regular), Sweet Tea, Candies, Chips, Cookies, Store Bought Juices, Alcohol in Excess of  1-2 drinks a day, Artificial Sweeteners, and  "Sugar-free" Products. This will help patient to have stable blood glucose profile and potentially avoid unintended weight gain.    However, she is allowed to introduce more complex carbs including whole-wheat/multigrain bread, rice, and pasta. This will help patient to have stable blood glucose profile . - I encouraged the patient to switch to  unprocessed or minimally processed complex starch and increased protein intake (animal or plant source), fruits, and vegetables.  - Patient is advised to stick to a routine mealtimes to eat 3 meals  a day and avoid unnecessary snacks ( to snack only to correct hypoglycemia).   - I have approached patient with the following individualized plan to manage diabetes and patient agrees:   - She has so many social problems distracting her from taking care of herself. - She is now pregnant at [redacted] weeks of gestation was prevailing A1c of 9%.  - I had a long discussion with her regarding this care.  -Her exclusive choice of therapy is still intensive insulin treatment. - She promises to do better. - Due to tight blood glucose readings related to her inability to tolerate enough nutrition, I'll proceed to lower her basal insulin Levemir to 20 units daily at bedtime ,  decrease her prandial insulin NovoLog to 5  units  3 times a day before meals for pre-meal BG readings of 60-150mg /dl, plus patient specific correction dose for unexpected hyperglycemia above 150mg /dl, associated with strict monitoring of glucose 7 times-before meals, 2 hours after meals, and at bedtime . - I discussed the goals of therapy with her: 60-90 mg/dL fasting and pre-meal blood glucose readings, and less than 120 mg/dL 2 hours after meals.  - Patient is warned not to take insulin without proper monitoring per orders. -Adjustment parameters are given for hypo and hyperglycemia in writing. -Patient is encouraged to call clinic for blood glucose levels less than 70 or above 200 mg /dl.  -  Patient specific target  A1c;  LDL, HDL, Triglycerides, and  Waist Circumference were discussed in detail.  2) BP/HTN:  Controlled at 98/64.  She is not on any medications currently. 3) Lipids/HPL:  controlled.  Currently, she is not on  statins. 4)  Weight/Diet: She will need some more weight gain- CDE Consult has been  initiated , exercise, and detailed carbohydrates information provided.  5) Chronic Care/Health Maintenance:  -Patient is encouraged to continue to follow  up with Ophthalmology, dentist ,  Podiatrist at least yearly or according to recommendations, and advised to  quit smoking. I have recommended yearly flu vaccine and pneumonia vaccination at least every 5 years; moderate intensity exercise for up to 150 minutes weekly; and  sleep for at least 7 hours a day.  - Time spent with the patient: 15 min, of which >50% was spent in reviewing her sugar logs , discussing her hypo- and hyper-glycemic episodes, reviewing her insulin doses and developing a plan to avoid hypo- and hyper-glycemia.   - Patient to bring meter and  blood glucose logs during her next visit.  - I advised patient to maintain close follow up with House, Eugenio Hoes, FNP for prenatal  care needs.  Follow up plan: - Return in about 1 week (around 04/04/2017) for follow up with meter and logs- no labs.  Marquis Lunch, MD Phone: [phone]  Fax: [phone]   03/28/2017, 10:44 AM

## 2017-04-02 ENCOUNTER — Ambulatory Visit: Payer: Medicaid Other | Admitting: "Endocrinology

## 2017-04-03 ENCOUNTER — Ambulatory Visit (INDEPENDENT_AMBULATORY_CARE_PROVIDER_SITE_OTHER): Payer: Medicaid Other | Admitting: "Endocrinology

## 2017-04-03 ENCOUNTER — Encounter (HOSPITAL_COMMUNITY): Payer: Self-pay

## 2017-04-03 ENCOUNTER — Other Ambulatory Visit (HOSPITAL_COMMUNITY): Payer: Self-pay

## 2017-04-03 ENCOUNTER — Encounter: Payer: Self-pay | Admitting: "Endocrinology

## 2017-04-03 VITALS — BP 90/60 | HR 103 | Ht 62.0 in | Wt 101.0 lb

## 2017-04-03 DIAGNOSIS — Z3A15 15 weeks gestation of pregnancy: Secondary | ICD-10-CM | POA: Diagnosis not present

## 2017-04-03 DIAGNOSIS — E108 Type 1 diabetes mellitus with unspecified complications: Secondary | ICD-10-CM | POA: Diagnosis not present

## 2017-04-03 DIAGNOSIS — E1065 Type 1 diabetes mellitus with hyperglycemia: Secondary | ICD-10-CM | POA: Diagnosis not present

## 2017-04-03 DIAGNOSIS — IMO0002 Reserved for concepts with insufficient information to code with codable children: Secondary | ICD-10-CM

## 2017-04-03 NOTE — Progress Notes (Signed)
Subjective:    Patient ID: Traci Ellison, female    DOB: 25-Jan-1986. Patient is being seen in f/u for management of diabetes requested by  House, Eugenio Hoes, FNP  Past Medical History:  Diagnosis Date  . Anxiety    8 months ago  . Depression    8 months ago  . Diabetes mellitus    type 1--since age 31  . Headache(784.0)    migraines, last 3 days ago-occurs occ.  Marland Kitchen History of kidney stones    x 1    Past Surgical History:  Procedure Laterality Date  . CERVICAL BIOPSY  W/ LOOP ELECTRODE EXCISION    . childbirth      x2 -last 3 months ago-[facility]  . 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 INDUCED ABORTION     Social History   Social History  . Marital status: Divorced    Spouse name: N/A  . Number of children: N/A  . Years of education: N/A   Social History Main Topics  . Smoking status: Current Every Day Smoker    Packs/day: 0.50    Years: 0.50    Types: Cigarettes  . Smokeless tobacco: Never Used  . Alcohol use No  . Drug use: No  . Sexual activity: Yes    Birth control/ protection: Condom   Other Topics Concern  . None   Social History Narrative  . None   Outpatient Encounter Prescriptions as of 04/03/2017  Medication Sig  . cyclobenzaprine (FLEXERIL) 10 MG tablet Take 10 mg by mouth 3 (three) times daily as needed for muscle spasms.  Marland Kitchen glucose blood (ACCU-CHEK ACTIVE STRIPS) test strip Use as instructed  At least 7x daily  . insulin aspart (NOVOLOG FLEXPEN) 100 UNIT/ML FlexPen Inject 5-11 Units into the skin 3 (three) times daily with meals.  . Insulin Detemir (LEVEMIR FLEXTOUCH) 100 UNIT/ML Pen Inject 20 Units into the skin daily at 10 pm.  . Insulin Pen Needle (BD PEN NEEDLE NANO U/F) 32G X 4 MM MISC 1 each by Does not apply route 4 (four) times daily.   No facility-administered encounter  medications on file as of 04/03/2017.    ALLERGIES: Allergies  Allergen Reactions  . Diclofenac Hives and Itching  . Nsaids    VACCINATION STATUS:  There is no immunization history on file for this patient.  Diabetes  She presents for her follow-up diabetic visit. She has type 1 diabetes mellitus. Onset time: She was diagnosed at approximate age of 9 years. Her disease course has been improving (She is now at 14 weeks of gestation, preconception A1c was high at 9%., Returns with tight readings of blood glucose mainly due to nausea/vomiting.). There are no hypoglycemic associated symptoms. Pertinent negatives for hypoglycemia include no confusion, headaches, pallor or seizures. Associated symptoms include blurred vision and fatigue. Pertinent negatives for diabetes include no chest pain, no polydipsia, no polyphagia and no polyuria. There are no hypoglycemic complications. Symptoms are worsening. Risk factors for coronary artery disease include diabetes mellitus, tobacco exposure and family history. Current diabetic treatments: She is on Lantus 30 units nightly and NovoLog 5 units 3 times a day before meals plus sliding scale. Her weight is increasing steadily (She has not gained any weight since last visit mainly due to her nausea/vomiting.). She is following a generally unhealthy diet.  When asked about meal planning, she reported none. She has not had a previous visit with a dietitian. She participates in exercise intermittently. Her home blood glucose trend is fluctuating dramatically. Her breakfast blood glucose range is generally 110-130 mg/dl. Her lunch blood glucose range is generally 70-90 mg/dl. Her dinner blood glucose range is generally 70-90 mg/dl. Her bedtime blood glucose range is generally 70-90 mg/dl. Her overall blood glucose range is 70-90 mg/dl. (She monitored only 7 times in the last 30 days, recommended 120 times.) An ACE inhibitor/angiotensin II receptor blocker is not being taken.  She does not see a podiatrist.Eye exam is not current.    Review of Systems  Constitutional: Positive for fatigue and unexpected weight change. Negative for chills and fever.  HENT: Negative for trouble swallowing and voice change.   Eyes: Positive for blurred vision. Negative for visual disturbance.  Respiratory: Negative for cough, shortness of breath and wheezing.   Cardiovascular: Negative for chest pain, palpitations and leg swelling.  Gastrointestinal: Negative for diarrhea, nausea and vomiting.  Endocrine: Negative for cold intolerance, heat intolerance, polydipsia, polyphagia and polyuria.  Musculoskeletal: Negative for arthralgias and myalgias.  Skin: Negative for color change, pallor, rash and wound.  Neurological: Negative for seizures and headaches.  Psychiatric/Behavioral: Negative for confusion and suicidal ideas.    Objective:    BP 90/60   Pulse (!) 103   Ht 5\' 2"  (1.575 m)   Wt 101 lb (45.8 kg)   BMI 18.47 kg/m   Wt Readings from Last 3 Encounters:  04/03/17 101 lb (45.8 kg)  03/28/17 102 lb (46.3 kg)  03/20/17 103 lb (46.7 kg)    Physical Exam  Constitutional: She is oriented to person, place, and time.  Light build with significant body weight deficit.  HENT:  Head: Normocephalic and atraumatic.  Poor dental condition.  Eyes: EOM are normal.  Neck: Normal range of motion. Neck supple. No tracheal deviation present. No thyromegaly present.  Cardiovascular: Normal rate and regular rhythm.   Pulmonary/Chest: Effort normal and breath sounds normal.  Abdominal: Soft. Bowel sounds are normal. There is no tenderness. There is no guarding.  Musculoskeletal: Normal range of motion. She exhibits no edema.  Neurological: She is alert and oriented to person, place, and time. She has normal reflexes. No cranial nerve deficit. Coordination normal.  Skin: Skin is warm and dry. No rash noted. No erythema. No pallor.  Psychiatric: She has a normal mood and affect.  Judgment normal.    CMP     Component Value Date/Time   NA 134 (L) 10/31/2016 1101   K 4.5 10/31/2016 1101   CL 102 10/31/2016 1101   CO2 23 10/31/2016 1101   GLUCOSE 356 (H) 10/31/2016 1101   BUN 15 10/31/2016 1101   CREATININE 0.62 10/31/2016 1101   CALCIUM 9.2 10/31/2016 1101   PROT 7.0 10/31/2016 1101   ALBUMIN 4.0 10/31/2016 1101   AST 9 (L) 10/31/2016 1101   ALT 8 10/31/2016 1101   ALKPHOS 53 10/31/2016 1101   BILITOT 0.4 10/31/2016 1101   GFRNONAA >90 03/31/2013 1045   GFRAA >90 03/31/2013 1045     Diabetic Labs (most recent): Lab Results  Component Value Date   HGBA1C 9 02/28/2017   HGBA1C 9.8 (H) 10/31/2016   HGBA1C 11.2 07/24/2016     Lipid Panel ( most recent) Lipid Panel     Component Value Date/Time   CHOL 59 11/26/2015   TRIG 88 11/26/2015   HDL 59 11/26/2015   LDLCALC  87 11/26/2015     Assessment & Plan:   1. Uncontrolled type 1 diabetes mellitus with complication [facility](H[facility])  - Patient has currently uncontrolled symptomatic type 1 DM since  31 years of age. - She is not pregnant at 15th week of gestation. Preconception A1c was high at 9%. - She is known to have alarmingly non-compliance to medical recommendations and  follow-ups. - She did not report gross complications however she remains at a high risk for obstetric competitions of uncontrolled diabetes, and  more acute and chronic complications of diabetes which include CAD, CVA, CKD, retinopathy, and neuropathy. These are all discussed in detail with the patient.  - I have counseled the patient on diet management, by adopting a carbohydrate restricted/protein rich diet. - She is not gaining any weight, mainly due to nausea making her eat small amounts.  she is advised to introduce more complex carbs including whole-wheat/multigrain bread, rice, and pasta, fruits and vegetables. - She is also advised to increase her  protein intake (animal or plant source). - I have approached patient with the  following individualized plan to manage diabetes and patient agrees:   - She has so many social problems distracting her from taking care of herself. - I had a long discussion with her regarding this care.  -Her exclusive choice of therapy is still intensive insulin treatment.  - Due to tight blood glucose readings at fasting related to her inability to tolerate enough nutrition, I'll proceed to lower her basal insulin Levemir to 15 units daily at bedtime ,  and to her prandial insulin NovoLog to 5  units  3 times a day before meals for pre-meal BG readings of 60-150mg /dl, plus patient specific correction dose for unexpected hyperglycemia above 150mg /dl, associated with strict monitoring of glucose 7 times-before meals, 2 hours after meals, and at bedtime . - I discussed the goals of therapy with her: 60-90 mg/dL fasting and pre-meal blood glucose readings, and less than 120 mg/dL 2 hours after meals.  - Patient is warned not to take insulin without proper monitoring per orders. -Adjustment parameters are given for hypo and hyperglycemia in writing. -Patient is encouraged to call clinic for blood glucose levels less than 70 or above 120x3.  - Patient specific target  A1c;  LDL, HDL, Triglycerides, and  Waist Circumference were discussed in detail.  2) BP/HTN:  Controlled and marginal at  98/64.  She is not on any medications currently. 3) Lipids/HPL:  controlled.  Currently, she is not on  statins. 4)  Weight/Diet: She will need some weight gain- CDE Consult has been  initiated , exercise, and detailed carbohydrates information provided.  5) Chronic Care/Health Maintenance:  -Patient is encouraged to continue to follow up with Ophthalmology, dentist ,  Podiatrist at least yearly or according to recommendations, and advised to  quit smoking. I have recommended yearly flu vaccine and pneumonia vaccination at least every 5 years; moderate intensity exercise for up to 150 minutes weekly; and  sleep  for at least 7 hours a day.  - Time spent with the patient: 25 min, of which >50% was spent in reviewing her sugar logs , discussing her hypo- and hyper-glycemic episodes, reviewing her current and  previous labs and insulin doses and developing a plan to avoid hypo- and hyper-glycemia.   - Patient to bring meter and  blood glucose logs during her next visit.  - I advised patient to maintain close follow up with House, Eugenio HoesKaren A, FNP for prenatal  care needs.  Follow up plan: - Return in about 2 weeks (around 04/17/2017) for follow up with meter and logs- no labs.  Marquis Lunch, MD Phone: [phone]  Fax: [phone]   04/03/2017, 10:13 AM

## 2017-04-16 ENCOUNTER — Ambulatory Visit: Payer: Medicaid Other | Admitting: "Endocrinology

## 2017-04-17 ENCOUNTER — Other Ambulatory Visit (HOSPITAL_COMMUNITY): Payer: Self-pay | Admitting: Unknown Physician Specialty

## 2017-04-19 ENCOUNTER — Encounter (HOSPITAL_COMMUNITY): Payer: Self-pay | Admitting: *Deleted

## 2017-04-23 ENCOUNTER — Encounter (HOSPITAL_COMMUNITY): Payer: Self-pay

## 2017-04-23 ENCOUNTER — Ambulatory Visit (HOSPITAL_COMMUNITY)
Admission: RE | Admit: 2017-04-23 | Discharge: 2017-04-23 | Disposition: A | Discharge status: Home / Self Care | Payer: Medicaid Other | Source: Ambulatory Visit | Attending: Family | Admitting: Family

## 2017-04-23 ENCOUNTER — Other Ambulatory Visit (HOSPITAL_COMMUNITY): Payer: Self-pay | Admitting: Unknown Physician Specialty

## 2017-04-23 ENCOUNTER — Ambulatory Visit (HOSPITAL_COMMUNITY)
Admission: RE | Admit: 2017-04-23 | Discharge: 2017-04-23 | Disposition: A | Discharge status: Home / Self Care | Payer: Medicaid Other | Source: Ambulatory Visit | Attending: Unknown Physician Specialty | Admitting: Unknown Physician Specialty

## 2017-04-23 ENCOUNTER — Other Ambulatory Visit (HOSPITAL_COMMUNITY): Payer: Self-pay | Admitting: *Deleted

## 2017-04-23 VITALS — BP 108/68 | HR 103 | Wt 103.2 lb

## 2017-04-23 DIAGNOSIS — Z3A18 18 weeks gestation of pregnancy: Secondary | ICD-10-CM

## 2017-04-23 DIAGNOSIS — E109 Type 1 diabetes mellitus without complications: Secondary | ICD-10-CM | POA: Diagnosis not present

## 2017-04-23 DIAGNOSIS — O24012 Pre-existing diabetes mellitus, type 1, in pregnancy, second trimester: Secondary | ICD-10-CM

## 2017-04-23 DIAGNOSIS — Z3689 Encounter for other specified antenatal screening: Secondary | ICD-10-CM

## 2017-04-23 DIAGNOSIS — Z87442 Personal history of urinary calculi: Secondary | ICD-10-CM | POA: Diagnosis not present

## 2017-04-23 DIAGNOSIS — Z3A17 17 weeks gestation of pregnancy: Secondary | ICD-10-CM | POA: Diagnosis not present

## 2017-04-23 DIAGNOSIS — Z3686 Encounter for antenatal screening for cervical length: Secondary | ICD-10-CM

## 2017-04-23 DIAGNOSIS — O24019 Pre-existing diabetes mellitus, type 1, in pregnancy, unspecified trimester: Secondary | ICD-10-CM

## 2017-04-23 DIAGNOSIS — Z888 Allergy status to other drugs, medicaments and biological substances status: Secondary | ICD-10-CM | POA: Diagnosis not present

## 2017-04-23 DIAGNOSIS — Z794 Long term (current) use of insulin: Secondary | ICD-10-CM | POA: Diagnosis not present

## 2017-04-23 DIAGNOSIS — O99332 Smoking (tobacco) complicating pregnancy, second trimester: Secondary | ICD-10-CM

## 2017-04-23 HISTORY — DX: Unspecified abnormal cytological findings in specimens from vagina: R87.629

## 2017-04-23 NOTE — Progress Notes (Signed)
Maternal Fetal Medicine Consultation  Requesting Provider(s): [facility]  Primary OB: Traci Ellison Reason for consultation: Type 1 diabetes  HPI: 31yo [id number] at 17+3 weeks with 22 year history of type 1 diabetes. She is under the care of Dr. Fransico Ellison who is managing her diabetes care. He has been seeing her weekly and is now seeing her every 2 weeks as her glycemic control has improved. She is having frequent hypoglycemic episodes. She is currently on 15 units of Levemir at bedtime and then 5 units Novalog AC, adding 1 unit for every /dl above 308 on her [facility] checks. She has had 2 previous term deliveries with her diabetes. Both were managed on a pump, and both babies we normally grown and had no anomalies. She reports the pregnancies and the deliveries were unremarkable. She has not had a retinal evaluation in c. 5 years. She has not yet had a 24h urine for protein and creatinine clearance. She has not yet had a referral for a fetal echocardiogram.   OB History: OB History    Gravida Para Term Preterm AB Living   SAB TAB Ectopic Multiple Live Births   2 1            PMH:  Past Medical History:  Diagnosis Date  . Anxiety    8 months ago  . Depression    8 months ago  . Diabetes mellitus    type 1--since age 32  . Headache(784.0)    migraines, last 3 days ago-occurs occ.  Marland Kitchen History of kidney stones    x 1   . Vaginal Pap smear, abnormal     PSH:  Past Surgical History:  Procedure Laterality Date  . CERVICAL BIOPSY  W/ LOOP ELECTRODE EXCISION    . childbirth      x2 -last 3 months ago-[facility]  . 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 INDUCED ABORTION     Meds: See EPIC section Allergies: non-steroidal anti-inflammatories FH: See EPIC section Soc: See EPIC section  Review of Systems: no  vaginal bleeding or cramping/contractions, no LOF, no nausea/vomiting. All other systems reviewed and are negative.  PE:  VS: See EPIC section GEN: well-appearing female ABD: gravid, NT  Please see separate document for fetal ultrasound report.  A/P: Type 1 DM Change EDC to 09/28/2017 Defer to Dr. Fransico Ellison for glycemic control. Recommended that the patient ask Ellison for his suggestions for opthalmologist for a retinal evaluation and get that done ASP Recommend a 24h urine for protein and creatinine ASAP Fetal echocardiogram has been scheduled. Repeat scan for transvaginal cervical length in 2 weeks due to borderline length on today's scan Repeat scan in 4 weeks to reassess growth and anatomy Begin antepartum fetal testing at 30 weeks Delivery timing will depend of fetal status and level of glycemic control. Would shoot for delivery at 38 weeks  Thank you for the opportunity to be a part of the care of Traci Ellison. Please contact our office if we can be of further assistance.   I spent approximately 30 minutes with this patient with over 50% of time spent in face-to-face counseling.

## 2017-05-07 ENCOUNTER — Ambulatory Visit (HOSPITAL_COMMUNITY)
Admission: RE | Admit: 2017-05-07 | Discharge: 2017-05-07 | Disposition: A | Discharge status: Home / Self Care | Payer: Medicaid Other | Source: Ambulatory Visit | Attending: Unknown Physician Specialty | Admitting: Unknown Physician Specialty

## 2017-05-07 ENCOUNTER — Other Ambulatory Visit (HOSPITAL_COMMUNITY): Payer: Self-pay | Admitting: Obstetrics and Gynecology

## 2017-05-07 ENCOUNTER — Encounter (HOSPITAL_COMMUNITY): Payer: Self-pay

## 2017-05-07 DIAGNOSIS — O24012 Pre-existing diabetes mellitus, type 1, in pregnancy, second trimester: Secondary | ICD-10-CM | POA: Diagnosis not present

## 2017-05-07 DIAGNOSIS — Z3A19 19 weeks gestation of pregnancy: Secondary | ICD-10-CM | POA: Diagnosis not present

## 2017-05-07 DIAGNOSIS — O3442 Maternal care for other abnormalities of cervix, second trimester: Secondary | ICD-10-CM | POA: Diagnosis not present

## 2017-05-07 DIAGNOSIS — O24312 Unspecified pre-existing diabetes mellitus in pregnancy, second trimester: Secondary | ICD-10-CM

## 2017-05-07 DIAGNOSIS — Z3686 Encounter for antenatal screening for cervical length: Secondary | ICD-10-CM | POA: Diagnosis present

## 2017-05-07 DIAGNOSIS — Z794 Long term (current) use of insulin: Secondary | ICD-10-CM | POA: Insufficient documentation

## 2017-05-07 DIAGNOSIS — E109 Type 1 diabetes mellitus without complications: Secondary | ICD-10-CM | POA: Insufficient documentation

## 2017-05-07 DIAGNOSIS — O99332 Smoking (tobacco) complicating pregnancy, second trimester: Secondary | ICD-10-CM

## 2017-05-07 NOTE — ED Notes (Signed)
Pt expressed that she has the "baby blues" and is crying all of the time.  She feels safe at home.  Instructed pt to call her OB office, they may want to see her before her next appt.  Offered Chaplain services, pt expressed interest.  Delma Officer, she will come talk with the patient after her ultrasound.

## 2017-05-07 NOTE — Progress Notes (Signed)
  Initial visit with Traci Ellison upon referral from Estanislado Emms, RN to introduce spiritual care services and offer support during her pregnancy. Pt reports increasing episodes of crying and depression.  Traci Ellison shared that she raised her two older daughters alone and is not looking forward to doing the same with her son.  I normalized that all things about pregnancy and raising children are not easy or worthy of looking forward to and we discussed ways she can practice self care and begin to prepare for extra support after the baby is born. We also discussed her anxiety around her shortened cervix.  I offered opportunities for expression of emotion and I provided Traci Ellison with contact information for [employer] to get connected with a counselor trained in perinatal mood and anxiety disorders.  In addition, Traci Ellison is aware of how to contact spiritual care for ongoing support during her appointments.     05/07/17 1145  Clinical Encounter Type  Visited With Patient  Visit Type Initial  Referral From Nurse  Stress Factors  Patient Stress Factors Major life changes

## 2017-05-10 ENCOUNTER — Ambulatory Visit: Payer: Medicaid Other | Admitting: "Endocrinology

## 2017-05-21 ENCOUNTER — Other Ambulatory Visit (HOSPITAL_COMMUNITY): Payer: Self-pay | Admitting: Obstetrics and Gynecology

## 2017-05-21 ENCOUNTER — Ambulatory Visit (HOSPITAL_COMMUNITY)
Admission: RE | Admit: 2017-05-21 | Discharge: 2017-05-21 | Disposition: A | Discharge status: Home / Self Care | Payer: Medicaid Other | Source: Ambulatory Visit | Attending: Family | Admitting: Family

## 2017-05-21 ENCOUNTER — Encounter (HOSPITAL_COMMUNITY): Payer: Self-pay

## 2017-05-21 DIAGNOSIS — Z3A21 21 weeks gestation of pregnancy: Secondary | ICD-10-CM | POA: Insufficient documentation

## 2017-05-21 DIAGNOSIS — O24012 Pre-existing diabetes mellitus, type 1, in pregnancy, second trimester: Secondary | ICD-10-CM | POA: Insufficient documentation

## 2017-05-21 DIAGNOSIS — O24019 Pre-existing diabetes mellitus, type 1, in pregnancy, unspecified trimester: Secondary | ICD-10-CM

## 2017-05-21 DIAGNOSIS — Z9889 Other specified postprocedural states: Secondary | ICD-10-CM | POA: Diagnosis not present

## 2017-05-21 DIAGNOSIS — Z362 Encounter for other antenatal screening follow-up: Secondary | ICD-10-CM | POA: Diagnosis not present

## 2017-05-21 DIAGNOSIS — O99332 Smoking (tobacco) complicating pregnancy, second trimester: Secondary | ICD-10-CM

## 2017-05-21 DIAGNOSIS — O3442 Maternal care for other abnormalities of cervix, second trimester: Secondary | ICD-10-CM | POA: Diagnosis not present

## 2017-06-14 ENCOUNTER — Other Ambulatory Visit (HOSPITAL_COMMUNITY): Payer: Self-pay

## 2017-07-05 LAB — HEMOGLOBIN A1C: Hemoglobin A1C: 6.7

## 2017-10-21 LAB — HEMOGLOBIN A1C: HEMOGLOBIN A1C: 9.2

## 2017-10-21 LAB — BASIC METABOLIC PANEL
BUN: 9 (ref 4–21)
Creatinine: 0.6 (ref <=1.1)

## 2017-11-01 ENCOUNTER — Ambulatory Visit: Payer: Medicaid Other | Admitting: "Endocrinology

## 2017-11-01 ENCOUNTER — Encounter: Payer: Self-pay | Admitting: "Endocrinology

## 2017-11-01 VITALS — BP 105/69 | HR 101 | Ht 62.0 in | Wt 105.0 lb

## 2017-11-01 DIAGNOSIS — E1065 Type 1 diabetes mellitus with hyperglycemia: Secondary | ICD-10-CM | POA: Diagnosis not present

## 2017-11-01 DIAGNOSIS — F17200 Nicotine dependence, unspecified, uncomplicated: Secondary | ICD-10-CM

## 2017-11-01 DIAGNOSIS — IMO0002 Reserved for concepts with insufficient information to code with codable children: Secondary | ICD-10-CM

## 2017-11-01 DIAGNOSIS — E108 Type 1 diabetes mellitus with unspecified complications: Secondary | ICD-10-CM | POA: Diagnosis not present

## 2017-11-01 MED ORDER — MICROLET LANCETS MISC: 3 refills | Status: DC

## 2017-11-01 MED ORDER — INSULIN GLARGINE 100 UNIT/ML SOLOSTAR PEN: 30.0000 [IU] | PEN_INJECTOR | Freq: Every day | SUBCUTANEOUS | 2 refills | Status: DC

## 2017-11-01 MED ORDER — INSULIN ASPART 100 UNIT/ML FLEXPEN: 8.0000 [IU] | PEN_INJECTOR | Freq: Three times a day (TID) | SUBCUTANEOUS | 2 refills | Status: DC

## 2017-11-01 NOTE — Progress Notes (Signed)
Subjective:    Patient ID: Traci Ellison, female    DOB: January 09, 1986. Patient is being seen in f/u for management of diabetes requested by  House, Eugenio Hoes, FNP  Past Medical History:  Diagnosis Date  . Anxiety    8 months ago  . Depression    8 months ago  . Diabetes mellitus    type 1--since age 32  . Headache(784.0)    migraines, last 3 days ago-occurs occ.  Marland Kitchen History of kidney stones    x 1   . Vaginal Pap smear, abnormal    Past Surgical History:  Procedure Laterality Date  . CERVICAL BIOPSY  W/ LOOP ELECTRODE EXCISION    . childbirth      x2 -last 3 months ago-[facility]  . 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 INDUCED ABORTION     Social History   Socioeconomic History  . Marital status: Divorced    Spouse name: Not on file  . Number of children: Not on file  . Years of education: Not on file  . Highest education level: Not on file  Occupational History  . Not on file  Social Needs  . Financial resource strain: Not on file  . Food insecurity:    Worry: Not on file    Inability: Not on file  . Transportation needs:    Medical: Not on file    Non-medical: Not on file  Tobacco Use  . Smoking status: Current Every Day Smoker    Packs/day: 0.50    Years: 0.50    Pack years: 0.25    Types: Cigarettes  . Smokeless tobacco: Never Used  Substance and Sexual Activity  . Alcohol use: No  . Drug use: No  . Sexual activity: Yes    Birth control/protection: Condom  Lifestyle  . Physical activity:    Days per week: Not on file    Minutes per session: Not on file  . Stress: Not on file  Relationships  . Social connections:    Talks on phone: Not on file    Gets together: Not on file    Attends religious service: Not on file    Active member of club or organization: Not on file    Attends  meetings of clubs or organizations: Not on file    Relationship status: Not on file  Other Topics Concern  . Not on file  Social History Narrative  . Not on file   Outpatient Encounter Medications as of 11/01/2017  Medication Sig  . clonazePAM (KLONOPIN) 1 MG tablet TAKE 1 TABLET (1 MG TOTAL) BY MOUTH NIGHTLY AS NEEDED FOR ANXIETY.  . cyclobenzaprine (FLEXERIL) 10 MG tablet Take 10 mg by mouth 3 (three) times daily as needed for muscle spasms.  Marland Kitchen FLUoxetine (PROZAC) 20 MG tablet Take 20 mg by mouth daily.  Marland Kitchen glucose blood (ACCU-CHEK ACTIVE STRIPS) test strip Use as instructed  At least 7x daily  . insulin aspart (NOVOLOG FLEXPEN) 100 UNIT/ML FlexPen Inject 8-14 Units into the skin 3 (three) times daily with meals.  . Insulin Glargine (LANTUS SOLOSTAR) 100 UNIT/ML Solostar Pen Inject 30 Units into the skin daily at 10 pm.  . Insulin Pen Needle (BD PEN NEEDLE NANO U/F) 32G X 4 MM MISC 1 each by Does not apply route 4 (four) times daily.  Marland Kitchen  MICROLET LANCETS MISC Use to test blood glucose 4 times a day.  . Pediatric Multiple Vit-C-FA (FLINSTONES GUMMIES OMEGA-3 DHA) CHEW Chew by mouth.  . RaNITidine HCl (ZANTAC PO) Take by mouth.  . [DISCONTINUED] citalopram (CELEXA) 10 MG tablet Take 10-20 mg by mouth daily as needed. For mood stabilization  . [DISCONTINUED] divalproex (DEPAKOTE ER) 500 MG 24 hr tablet Take 1,000 mg by mouth at bedtime.  . [DISCONTINUED] insulin aspart (NOVOLOG FLEXPEN) 100 UNIT/ML FlexPen Inject 5-11 Units into the skin 3 (three) times daily with meals.  . [DISCONTINUED] Insulin Detemir (LEVEMIR FLEXTOUCH) 100 UNIT/ML Pen Inject 20 Units into the skin daily at 10 pm. (Patient taking differently: Inject 25 Units into the skin daily at 10 pm. )  . [DISCONTINUED] Norethindrone-Ethinyl Estradiol-Fe Biphas (LO LOESTRIN FE) 1 MG-10 MCG / 10 MCG tablet Take 1 tablet by mouth daily.   No facility-administered encounter medications on file as of 11/01/2017.    ALLERGIES: Allergies   Allergen Reactions  . Diclofenac Hives and Itching  . Nsaids    VACCINATION STATUS:  There is no immunization history on file for this patient.  Diabetes  She presents for her follow-up diabetic visit. She has type 1 diabetes mellitus. Onset time: She was diagnosed at approximate age of 9 years. Her disease course has been worsening. There are no hypoglycemic associated symptoms. Pertinent negatives for hypoglycemia include no confusion, headaches, pallor or seizures. Pertinent negatives for diabetes include no blurred vision, no chest pain, no fatigue, no polydipsia, no polyphagia and no polyuria. There are no hypoglycemic complications. Symptoms are worsening. Risk factors for coronary artery disease include diabetes mellitus, tobacco exposure and family history. Current diabetic treatments: She is on Lantus 30 units nightly and NovoLog 5 units 3 times a day before meals plus sliding scale. Her weight is stable. She is following a generally unhealthy diet. When asked about meal planning, she reported none. She has not had a previous visit with a dietitian. She participates in exercise intermittently. Her breakfast blood glucose range is generally >200 mg/dl. Her lunch blood glucose range is generally >200 mg/dl. Her dinner blood glucose range is generally >200 mg/dl. Her bedtime blood glucose range is generally >200 mg/dl. Her overall blood glucose range is >200 mg/dl. (She is 8 weeks postpartum.  She is not breast-feeding.  She comes with a week worth of blood glucose readings significantly above target averaging greater than 200 mg/dL.) An ACE inhibitor/angiotensin II receptor blocker is not being taken. She does not see a podiatrist.Eye exam is not current.    Review of Systems  Constitutional: Negative for chills, fatigue, fever and unexpected weight change.  HENT: Negative for trouble swallowing and voice change.   Eyes: Negative for blurred vision and visual disturbance.  Respiratory:  Negative for cough, shortness of breath and wheezing.   Cardiovascular: Negative for chest pain, palpitations and leg swelling.  Gastrointestinal: Negative for diarrhea, nausea and vomiting.  Endocrine: Negative for cold intolerance, heat intolerance, polydipsia, polyphagia and polyuria.  Musculoskeletal: Negative for arthralgias and myalgias.  Skin: Negative for color change, pallor, rash and wound.  Neurological: Negative for seizures and headaches.  Psychiatric/Behavioral: Negative for confusion and suicidal ideas.    Objective:    BP 105/69   Pulse (!) 101   Ht 5\' 2"  (1.575 m)   Wt 105 lb (47.6 kg)   LMP 12/16/2016 (Exact Date)   BMI 19.20 kg/m   Wt Readings from Last 3 Encounters:  11/01/17 105 lb (47.6 kg)  05/21/17 107 lb (48.5 kg)  05/07/17 103 lb 3.2 oz (46.8 kg)    Physical Exam  Constitutional: She is oriented to person, place, and time.  Light build with significant body weight deficit.  HENT:  Head: Normocephalic and atraumatic.  Poor dental condition.  Eyes: EOM are normal.  Neck: Normal range of motion. Neck supple. No tracheal deviation present. No thyromegaly present.  Cardiovascular: Normal rate and regular rhythm.  Pulmonary/Chest: Effort normal.  Abdominal: There is no tenderness. There is no guarding.  Musculoskeletal: Normal range of motion. She exhibits no edema.  Neurological: She is alert and oriented to person, place, and time. She has normal reflexes. No cranial nerve deficit. Coordination normal.  Skin: Skin is warm and dry. No rash noted. No erythema. No pallor.  Psychiatric: She has a normal mood and affect. Judgment normal.    CMP     Component Value Date/Time   NA 134 (L) 10/31/2016 1101   K 4.5 10/31/2016 1101   CL 102 10/31/2016 1101   CO2 23 10/31/2016 1101   GLUCOSE 356 (H) 10/31/2016 1101   BUN 9 10/21/2017   CREATININE 0.6 10/21/2017   CREATININE 0.62 10/31/2016 1101   CALCIUM 9.2 10/31/2016 1101   PROT 7.0 10/31/2016 1101    ALBUMIN 4.0 10/31/2016 1101   AST 9 (L) 10/31/2016 1101   ALT 8 10/31/2016 1101   ALKPHOS 53 10/31/2016 1101   BILITOT 0.4 10/31/2016 1101   GFRNONAA >90 03/31/2013 1045   GFRAA >90 03/31/2013 1045     Diabetic Labs (most recent): Lab Results  Component Value Date   HGBA1C 9.2 10/21/2017   HGBA1C 6.7 07/05/2017   HGBA1C 9 02/28/2017     Lipid Panel ( most recent) Lipid Panel     Component Value Date/Time   CHOL 59 11/26/2015   TRIG 88 11/26/2015   HDL 59 11/26/2015   LDLCALC 87 11/26/2015     Assessment & Plan:   1. Uncontrolled type 1 diabetes mellitus with complication (HCC)  - Patient has currently uncontrolled symptomatic type 1 DM since  32 years of age. -This patient was last seen at 15 weeks of gestation for diabetes care, however she did not return for follow-up. - She is now 8 weeks postpartum (delivered a boy who weighed 5 1/2  pounds , had to undergo emergency C-section due to failure to progress at 37 weeks of gestation) . -Her most recent labs show A1c of 9.2% on October 21, 2017 increasing from 6.7% in November 2018. - She is known to have  non-compliance/nonadherence to medical recommendations and  follow-ups, mainly due to so many social problems she has .  She is a single mom with 3 kids.  - She did not report gross complications however she remains at a high risk for obstetric competitions of uncontrolled diabetes, and  more acute and chronic complications of diabetes which include CAD, CVA, CKD, retinopathy, and neuropathy. These are all discussed in detail with the patient.   Although she has always been light build, she has significant weight deficit.  she is advised to introduce more complex carbs including whole-wheat/multigrain bread, rice, and pasta, fruits and vegetables. - She is also advised to increase her  protein intake (animal or plant source). - I have approached patient with the following individualized plan to manage diabetes and patient  agrees:   - I had a long discussion with her regarding her diabetes care.   -Her exclusive choice of therapy is still intensive  treatment with insulin basal/bolus.   -Due to her presentation with significantly above target blood glucose readings average above 200 mg/dL, advised her to increase her basal insulin (she wishes to switch it back to Lantus now that pregnancy is over) Lantus to 30 units nightly, increase NovoLog to 8 units 3 times a day before meals for pre-meal BG readings of 90 -150mg /dl, plus patient specific correction dose for unexpected hyperglycemia above 150mg /dl, associated with strict monitoring of glucose 4 times a day- before meals and at bedtime.     - Patient is warned not to take insulin without proper monitoring per orders. -Adjustment parameters are given for hypo and hyperglycemia in writing. -Patient is encouraged to call clinic for blood glucose levels less than 70 or above 200 x3.  - Patient specific target  A1c;  LDL, HDL, Triglycerides, and  Waist Circumference were discussed in detail.  2) BP/HTN:  Controlled at 105/69.    She is not on any medications currently. 3) Lipids/HPL:  controlled.  Currently, she is not on  statins. 4)  Weight/Diet: She will need some weight gain- CDE Consult has been  initiated , exercise, and detailed carbohydrates information provided.  5) Chronic Care/Health Maintenance:  -Patient is encouraged to continue to follow up with Ophthalmology, dentist ,  Podiatrist at least yearly or according to recommendations, and advised to  quit smoking. I have recommended yearly flu vaccine and pneumonia vaccination at least every 5 years; moderate intensity exercise for up to 150 minutes weekly; and  sleep for at least 7 hours a day.   - I advised patient to maintain close follow up with House, Eugenio Hoes, FNP for prenatal  care needs.   - Time spent with the patient: 25 min, of which >50% was spent in reviewing her blood glucose logs ,  discussing her hypo- and hyper-glycemic episodes, reviewing her current and  previous labs and insulin doses and developing a plan to avoid hypo- and hyper-glycemia. Please refer to Patient Instructions for Blood Glucose Monitoring and Insulin/Medications Dosing Guide"  in media tab for additional information. Alizia Lance Morin participated in the discussions, expressed understanding, and voiced agreement with the above plans.  All questions were answered to her satisfaction. she is encouraged to contact clinic should she have any questions or concerns prior to her return visit.  Follow up plan: - Return in about 2 weeks (around 11/15/2017) for follow up with meter and logs- no labs.  Marquis Lunch, MD Phone: [phone]  Fax: [phone]  -  This note was partially dictated with voice recognition software. Similar sounding words can be transcribed inadequately or may not  be corrected upon review.  11/01/2017, 11:42 AM

## 2017-11-13 ENCOUNTER — Encounter: Payer: Self-pay | Admitting: "Endocrinology

## 2017-11-13 ENCOUNTER — Ambulatory Visit: Payer: Medicaid Other | Admitting: "Endocrinology

## 2017-12-21 ENCOUNTER — Encounter (HOSPITAL_COMMUNITY): Payer: Self-pay

## 2018-03-01 IMAGING — US US MFM OB TRANSVAGINAL
1 series · 13 of 28 positions shown · non-contrast
Comparison: none

[Series 1: us mfm ob transvaginal · 13 of 80 slices shown]
[im 3/80]
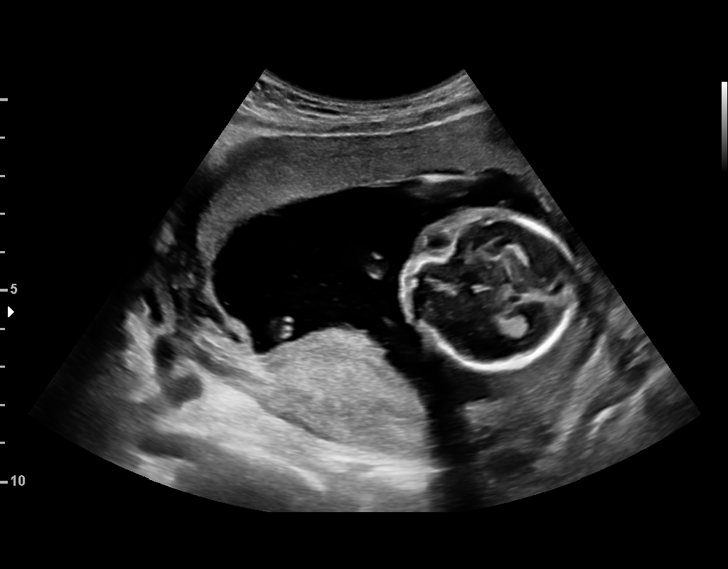
[im 9/80]
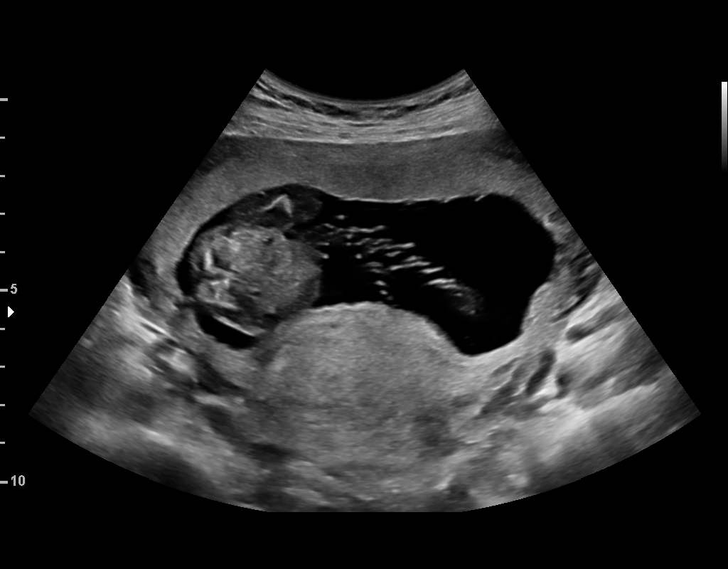
[im 15/80]
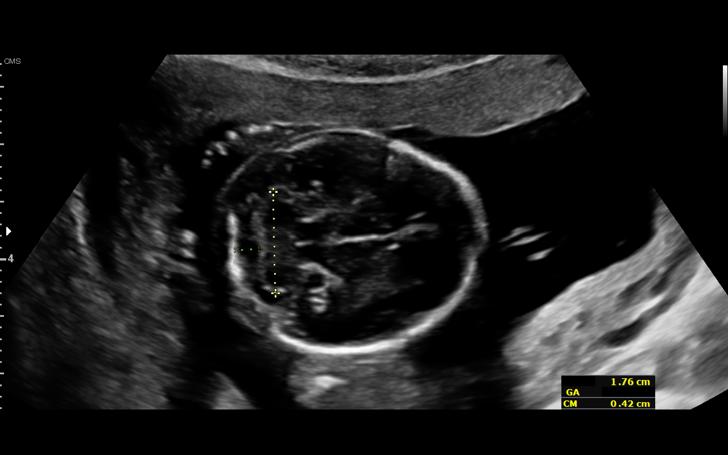
[im 21/80]
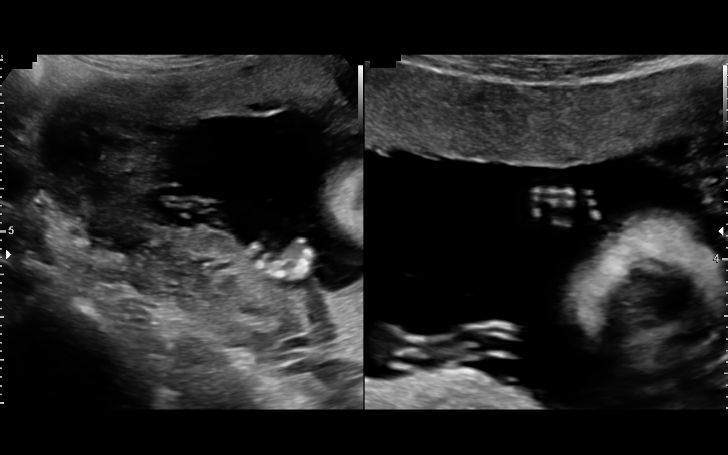
[im 27/80]
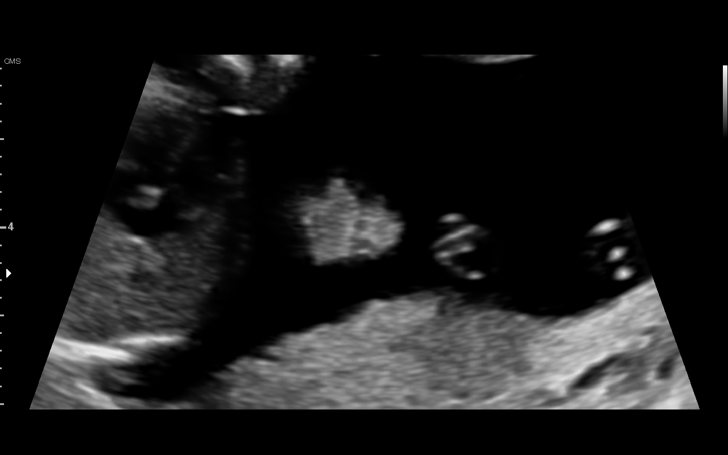
[im 33/80]
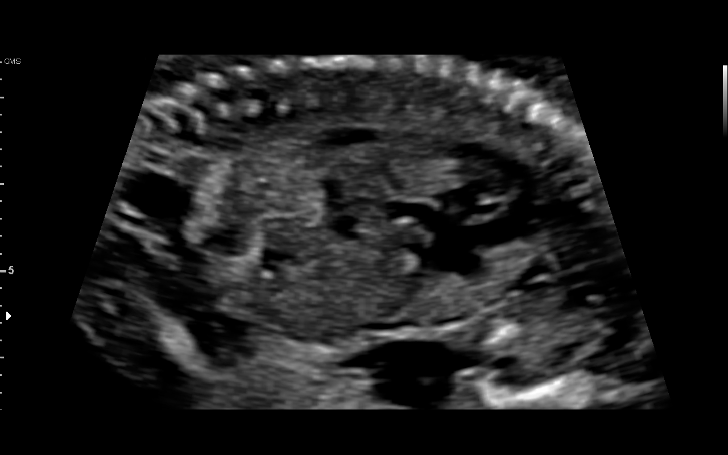
[im 41/80]
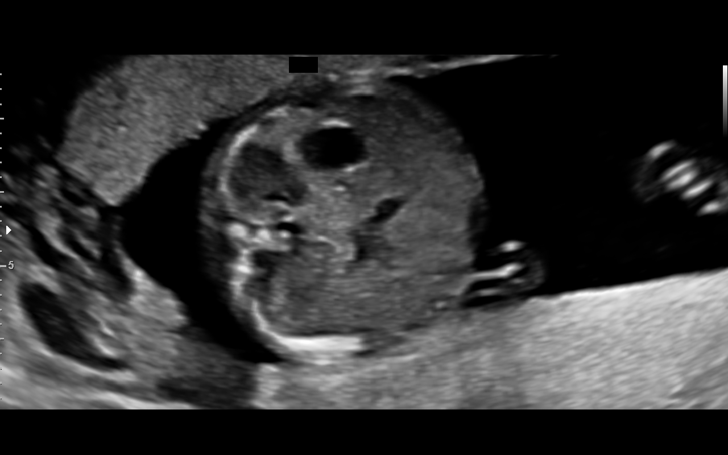
[im 47/80]
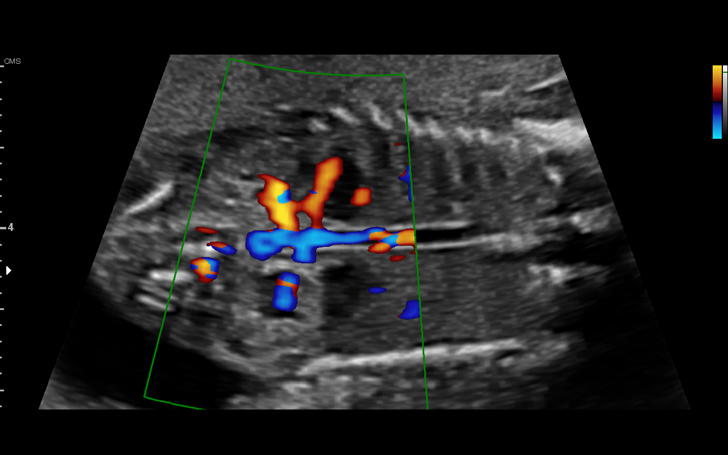
[im 53/80]
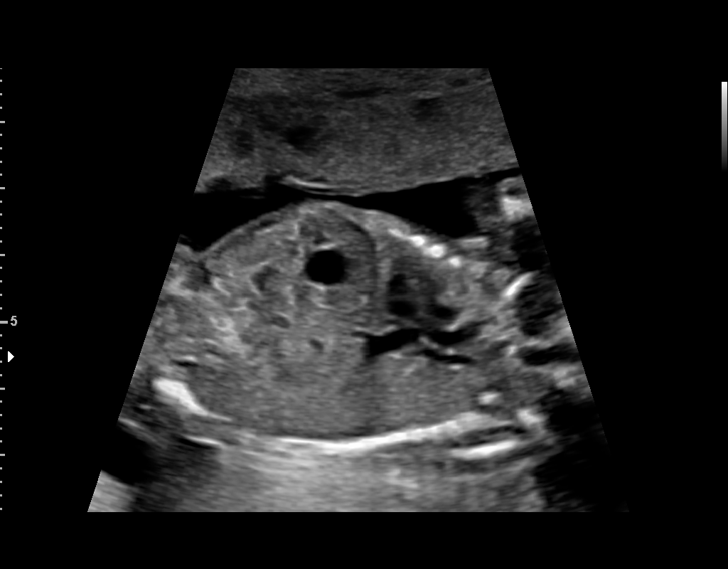
[im 59/80]
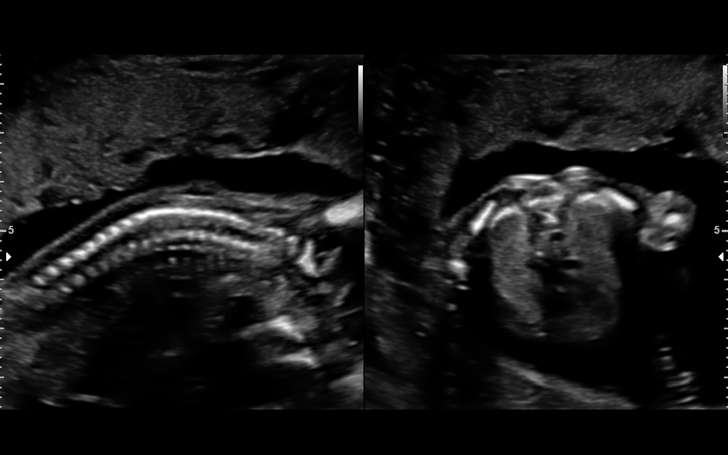
[im 65/80]
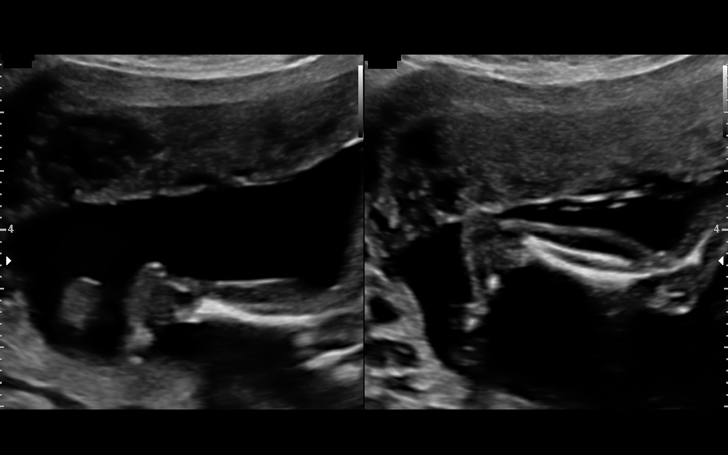
[im 71/80]
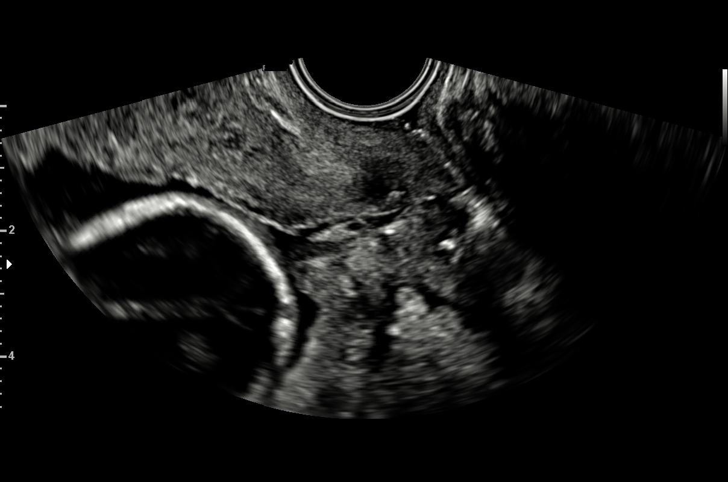
[im 77/80]
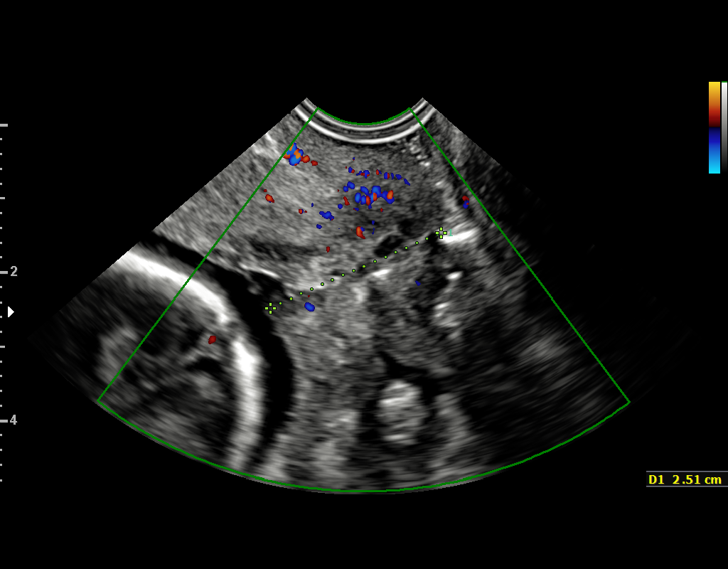

[13 of 28 positions shown; findings below may reference images not displayed]

Center
[REDACTED] [id number]

1  Blade Aujla           [id number]      [id number]     [id number]
2  JESSINI SAMINADAS           [id number]      [id number]     [id number]
Indications

17 weeks gestation of pregnancy
Encounter for fetal anatomic survey
Pre-existing diabetes, type 1, in pregnancy,
second trimester (insulin)
Tobacco use complicating pregnancy,
second trimester
OB History

Gravidity:    6         Term:   2        Prem:   0        SAB:   2
TOP:          1       Ectopic:  0        Living: 2
Fetal Evaluation

Num Of Fetuses:     1
Cardiac Activity:   Observed
Presentation:       Cephalic
Placenta:           Anterior, above cervical os
P. Cord Insertion:  Visualized

Amniotic Fluid
AFI FV:      Subjectively within normal limits

Largest Pocket(cm)
4.11
Biometry
BPD:      38.3  mm     G. Age:  17w 5d         61  %    CI:        74.63   %    70 - 86
FL/HC:      15.1   %    14.6 -
HC:      140.7  mm     G. Age:  17w 3d         39  %    HC/AC:      1.13        1.07 -
AC:      124.3  mm     G. Age:  18w 0d         69  %    FL/BPD:     55.4   %
FL:       21.2  mm     G. Age:  16w 3d         11  %    FL/AC:      17.1   %    20 - 24
HUM:      21.6  mm     G. Age:  16w 4d         31  %
CER:      17.6  mm     G. Age:  17w 4d         53  %
NFT:       3.2  mm
CM:        4.2  mm

Est. FW:     187  gm      0 lb 7 oz     49  %
Gestational Age

LMP:           18w 2d        Date:  12/16/16                 EDD:   09/22/17
U/S Today:     17w 3d                                        EDD:   09/28/17
Best:          17w 3d     Det. By:  Early Ultrasound         EDD:   09/28/17
(03/01/17)
Anatomy

Cranium:               Appears normal         Aortic Arch:            Appears normal
Cavum:                 Appears normal         Ductal Arch:            Appears normal
Ventricles:            Appears normal         Diaphragm:              Appears normal
Choroid Plexus:        Appears normal         Stomach:                Appears normal, left
sided
Cerebellum:            Appears normal         Abdomen:                Appears normal
Posterior Fossa:       Appears normal         Abdominal Wall:         Appears nml (cord
insert, abd wall)
Nuchal Fold:           Appears normal         Cord Vessels:           Appears normal (3
vessel cord)
Face:                  Appears normal         Kidneys:                Appear normal
(orbits and profile)
Lips:                  Appears normal         Bladder:                Appears normal
Thoracic:              Appears normal         Spine:                  Appears normal
Heart:                 Appears normal         Upper Extremities:      Appears normal
(4CH, axis, and
situs)
RVOT:                  Appears normal         Lower Extremities:      Appears normal
LVOT:                  Appears normal

Other:  Fetus appears to be a male. Heels and 5th digit visualized. Nasal
bone visualized.
Cervix Uterus Adnexa

Cervix
Length:            2.5  cm.
Measured transvaginally.

Uterus
No abnormality visualized.

Left Ovary
Not visualized.

Right Ovary
Not visualized.
Cul De Sac:   No free fluid seen.
Adnexa:       No abnormality visualized.
Impression

Singleton intrauterine pregnancy at 17+3 weeks with type 1
diabetes
Review of the anatomy shows no sonographic markers for
aneuploidy or structural anomalies
Amniotic fluid volume is normal
Biometry suggested an EGA of 17+3 weeks which agrees
with the 9 week US
Estimated fetal weight is 187g which is growth in the 49th
percentile
Transvaginal cervical length is 25mm without funnelling
Recommendations

See MFM consult

## 2018-09-18 ENCOUNTER — Telehealth: Payer: Self-pay

## 2018-09-18 ENCOUNTER — Telehealth: Payer: Self-pay | Admitting: *Deleted

## 2018-09-18 DIAGNOSIS — IMO0002 Reserved for concepts with insufficient information to code with codable children: Secondary | ICD-10-CM

## 2018-09-18 DIAGNOSIS — F17200 Nicotine dependence, unspecified, uncomplicated: Secondary | ICD-10-CM

## 2018-09-18 DIAGNOSIS — Z91199 Patient's noncompliance with other medical treatment and regimen due to unspecified reason: Secondary | ICD-10-CM

## 2018-09-18 DIAGNOSIS — Z9119 Patient's noncompliance with other medical treatment and regimen: Secondary | ICD-10-CM

## 2018-09-18 DIAGNOSIS — E1065 Type 1 diabetes mellitus with hyperglycemia: Secondary | ICD-10-CM

## 2018-09-18 DIAGNOSIS — E108 Type 1 diabetes mellitus with unspecified complications: Principal | ICD-10-CM

## 2018-09-18 NOTE — Telephone Encounter (Signed)
She can come get a sample of Toujeo to use in place of her Lantus. I will send rx for her strips.

## 2018-09-18 NOTE — Telephone Encounter (Signed)
I left a voicemail for the patients recommendations

## 2018-09-18 NOTE — Telephone Encounter (Signed)
Pt called stated that she was out of her lantis pen and accucheck aviva test strips. The soonest she could get in due to her work schedule was march. Wanted to know if this could be called in to [facility]

## 2018-09-18 NOTE — Telephone Encounter (Signed)
LeighAnn Sarye Kath, CMA  

## 2018-09-23 ENCOUNTER — Other Ambulatory Visit: Payer: Self-pay

## 2018-09-23 MED ORDER — GLUCOSE BLOOD VI STRP: ORAL_STRIP | 5 refills | Status: DC

## 2018-09-23 MED ORDER — MICROLET LANCETS MISC: 5 refills | Status: AC

## 2018-10-16 ENCOUNTER — Ambulatory Visit: Payer: Medicaid Other | Admitting: "Endocrinology

## 2021-05-26 LAB — HEMOGLOBIN A1C: Hemoglobin A1C: 11.2

## 2021-05-26 LAB — BASIC METABOLIC PANEL
BUN: 15 (ref 4–21)
Creatinine: 0.8 (ref 0.5–1.1)

## 2021-05-26 LAB — CBC AND DIFFERENTIAL: Hemoglobin: 15.5 (ref 12.0–16.0)

## 2021-05-26 LAB — LIPID PANEL
Cholesterol: 181 (ref 0–200)
HDL: 60 (ref 35–70)
LDL Cholesterol: 106
Triglycerides: 80 (ref 40–160)

## 2021-05-26 LAB — TSH: TSH: 1.25 (ref 0.41–5.90)

## 2021-06-06 ENCOUNTER — Telehealth: Payer: Self-pay | Admitting: "Endocrinology

## 2021-06-06 NOTE — Telephone Encounter (Signed)
Received new referral on pt for DM. Called pt, no answer and VM full

## 2021-07-06 ENCOUNTER — Encounter: Payer: Self-pay | Admitting: "Endocrinology

## 2021-07-06 ENCOUNTER — Other Ambulatory Visit: Payer: Self-pay

## 2021-07-06 ENCOUNTER — Ambulatory Visit (INDEPENDENT_AMBULATORY_CARE_PROVIDER_SITE_OTHER): Payer: Medicaid Other | Admitting: "Endocrinology

## 2021-07-06 VITALS — BP 92/58 | HR 84 | Ht 60.75 in | Wt 112.8 lb

## 2021-07-06 DIAGNOSIS — E1065 Type 1 diabetes mellitus with hyperglycemia: Secondary | ICD-10-CM | POA: Diagnosis not present

## 2021-07-06 DIAGNOSIS — F17200 Nicotine dependence, unspecified, uncomplicated: Secondary | ICD-10-CM

## 2021-07-06 DIAGNOSIS — E782 Mixed hyperlipidemia: Secondary | ICD-10-CM | POA: Diagnosis not present

## 2021-07-06 DIAGNOSIS — I10 Essential (primary) hypertension: Secondary | ICD-10-CM | POA: Diagnosis not present

## 2021-07-06 MED ORDER — LANTUS SOLOSTAR 100 UNIT/ML ~~LOC~~ SOPN: 26.0000 [IU] | PEN_INJECTOR | Freq: Every day | SUBCUTANEOUS | 2 refills | Status: DC

## 2021-07-06 MED ORDER — NOVOLOG FLEXPEN 100 UNIT/ML ~~LOC~~ SOPN
5.0000 [IU] | PEN_INJECTOR | Freq: Three times a day (TID) | SUBCUTANEOUS | 2 refills | Status: DC
Start: 2021-07-06 — End: 2021-11-09

## 2021-07-06 NOTE — Patient Instructions (Signed)

## 2021-07-06 NOTE — Progress Notes (Signed)
Endocrinology Consult Note       07/06/2021, 1:52 PM   Subjective:    Patient ID: Traci Ellison, female    DOB: 22-Jul-1986.  Shambria A Gayton is being seen in consultation for management of currently uncontrolled symptomatic diabetes requested by  Wilburt Finlay, MD.   Past Medical History:  Diagnosis Date   Anxiety    8 months ago   Depression    8 months ago   Diabetes mellitus    type 1--since age 35   Diabetes mellitus type I ([facility])    Headache(784.0)    migraines, last 3 days ago-occurs occ.   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;  Surgeon: Harl Bowie, MD;  Location: [facility];  Service: General;  Laterality: Left;   INDUCED ABORTION      Social History   Socioeconomic History   Marital status: Divorced    Spouse name: Not on file   Number of children: Not on file   Years of education: Not on file   Highest education level: Not on file  Occupational History   Not on file  Tobacco Use   Smoking status: Every Day    Packs/day: 0.50    Years: 0.50    Pack years: 0.25    Types: Cigarettes   Smokeless tobacco: Never  Vaping Use   Vaping Use: Former  Substance and Sexual Activity   Alcohol use: No   Drug use: No   Sexual activity: Yes    Birth control/protection: Condom  Other Topics Concern   Not on file  Social History Narrative   Not on file   Social Determinants of Health   Financial Resource Strain: Not on file  Food Insecurity: Not on file  Transportation Needs: Not on file  Physical Activity: Not on file  Stress: Not on file  Social Connections: Not on file     Family History  Problem Relation Age of Onset   Hypertension Mother    Diabetes Mother    Heart disease Mother    Stroke Mother     Outpatient Encounter Medications as of 07/06/2021  Medication Sig   atorvastatin (LIPITOR) 10 MG tablet Take 10 mg by mouth at bedtime.   busPIRone (BUSPAR) 5 MG tablet Take 10 mg by mouth 3 (three) times daily.   clonazePAM (KLONOPIN) 1 MG tablet TAKE 1 TABLET (1 MG TOTAL) BY MOUTH NIGHTLY AS NEEDED FOR ANXIETY. (Patient not taking: Reported on 07/06/2021)   cyclobenzaprine (FLEXERIL) 10 MG tablet Take 10 mg by mouth 3 (three) times daily as needed for muscle spasms. (Patient not taking: Reported on 07/06/2021)   FLUoxetine (PROZAC) 10 MG capsule Take 20 mg by mouth daily.   glucose blood (ACCU-CHEK ACTIVE STRIPS) test strip Use  as instructed  At least 7x daily   insulin aspart (NOVOLOG FLEXPEN) 100 UNIT/ML FlexPen Inject 5-11 Units into the skin 3 (three) times daily with meals.   insulin glargine (LANTUS SOLOSTAR) 100 UNIT/ML Solostar Pen Inject 26 Units into the skin at bedtime.   Insulin Pen Needle (BD PEN NEEDLE NANO U/F) 32G X 4 MM MISC 1 each by Does not apply route 4 (four) times daily.   MICROLET LANCETS MISC Use to test blood glucose up to 7 times a day.   Pediatric Multiple Vit-C-FA (FLINSTONES GUMMIES OMEGA-3 DHA) CHEW Chew by mouth. (Patient not taking: Reported on 07/06/2021)   RaNITidine HCl (ZANTAC PO) Take by mouth as needed.   RESTASIS 0.05 % ophthalmic emulsion 1 drop 2 (two) times daily.   [DISCONTINUED] citalopram (CELEXA) 10 MG tablet Take 10-20 mg by mouth daily as needed. For mood stabilization   [DISCONTINUED] divalproex (DEPAKOTE ER) 500 MG 24 hr tablet Take 1,000 mg by mouth at bedtime.   [DISCONTINUED] FLUoxetine (PROZAC) 20 MG tablet Take 20 mg by mouth daily.   [DISCONTINUED] insulin aspart (NOVOLOG FLEXPEN) 100 UNIT/ML FlexPen Inject 8-14 Units into the skin 3 (three) times daily with meals.   [DISCONTINUED] Insulin Glargine  (LANTUS SOLOSTAR) 100 UNIT/ML Solostar Pen Inject 30 Units into the skin daily at 10 pm.   [DISCONTINUED] Norethindrone-Ethinyl Estradiol-Fe Biphas (LO LOESTRIN FE) 1 MG-10 MCG / 10 MCG tablet Take 1 tablet by mouth daily.   No facility-administered encounter medications on file as of 07/06/2021.    ALLERGIES: Allergies  Allergen Reactions   Diclofenac Hives and Itching   Nsaids     VACCINATION STATUS:  There is no immunization history on file for this patient.  Diabetes She presents for her initial diabetic visit. She has type 1 diabetes mellitus. Onset time: She was diagnosed since age 35. Her disease course has been worsening. There are no hypoglycemic associated symptoms. Pertinent negatives for hypoglycemia include no confusion, headaches, pallor or seizures. Associated symptoms include fatigue, polydipsia and polyuria. Pertinent negatives for diabetes include no chest pain and no polyphagia. There are no hypoglycemic complications. Symptoms are worsening. There are no diabetic complications. Risk factors for coronary artery disease include dyslipidemia, diabetes mellitus, hypertension, sedentary lifestyle and tobacco exposure. Current diabetic treatment includes insulin injections (She is currently on Lantus 30 units nightly, NovoLog 3 units 3 times daily AC.). Her weight is fluctuating minimally. She is following a generally unhealthy diet. When asked about meal planning, she reported none. Her home blood glucose trend is decreasing steadily. Her breakfast blood glucose range is generally 140-180 mg/dl. Her lunch blood glucose range is generally 140-180 mg/dl. Her dinner blood glucose range is generally 140-180 mg/dl. Her bedtime blood glucose range is generally 140-180 mg/dl. Her overall blood glucose range is 140-180 mg/dl. (Since she has received a Dexcom CGM, she saw improvement in her glycemic profile.  Her AGP report shows 55% in range for the last 14 days.  41% above range, no  significant hypoglycemia.  Her average blood glucose 171.  Her recent A1c was 11.2%.) An ACE inhibitor/angiotensin II receptor blocker is being taken.  Hyperlipidemia This is a chronic problem. The problem is uncontrolled. Pertinent negatives include no chest pain, myalgias or shortness of breath. Current antihyperlipidemic treatment includes statins. Risk factors for coronary artery disease include dyslipidemia, diabetes mellitus, hypertension, a sedentary lifestyle and family history.  Hypertension The current episode started more than 1 year ago. Pertinent negatives include no chest pain, headaches, palpitations or shortness  of breath. Risk factors for coronary artery disease include diabetes mellitus, dyslipidemia, smoking/tobacco exposure and sedentary lifestyle. Past treatments include ACE inhibitors.    Review of Systems  Constitutional:  Positive for fatigue. Negative for chills, fever and unexpected weight change.  HENT:  Negative for trouble swallowing and voice change.   Eyes:  Negative for visual disturbance.  Respiratory:  Negative for cough, shortness of breath and wheezing.   Cardiovascular:  Negative for chest pain, palpitations and leg swelling.  Gastrointestinal:  Negative for diarrhea, nausea and vomiting.  Endocrine: Positive for polydipsia and polyuria. Negative for cold intolerance, heat intolerance and polyphagia.  Musculoskeletal:  Negative for arthralgias and myalgias.  Skin:  Negative for color change, pallor, rash and wound.  Neurological:  Negative for seizures and headaches.  Psychiatric/Behavioral:  Negative for confusion and suicidal ideas.    Objective:    Vitals with BMI 07/06/2021 11/01/2017 05/21/2017  Height 5' 0.75" 5\' 2"  -  Weight 112 lbs 13 oz 105 lbs 107 lbs  BMI 21.49 19.2 -  Systolic 92 105 99  Diastolic 58 69 55  Pulse 84 101 87    BP (!) 92/58   Pulse 84   Ht 5' 0.75" (1.543 m)   Wt 112 lb 12.8 oz (51.2 kg)   BMI 21.49 kg/m   Wt Readings  from Last 3 Encounters:  07/06/21 112 lb 12.8 oz (51.2 kg)  11/01/17 105 lb (47.6 kg)  05/21/17 107 lb (48.5 kg)     Physical Exam Constitutional:      Appearance: She is well-developed.  HENT:     Head: Normocephalic and atraumatic.  Neck:     Thyroid: No thyromegaly.     Trachea: No tracheal deviation.  Cardiovascular:     Rate and Rhythm: Normal rate and regular rhythm.  Pulmonary:     Effort: Pulmonary effort is normal.  Abdominal:     General: Bowel sounds are normal.     Palpations: Abdomen is soft.     Tenderness: There is no abdominal tenderness. There is no guarding.  Musculoskeletal:        General: Normal range of motion.     Cervical back: Normal range of motion and neck supple.  Skin:    General: Skin is warm and dry.     Coloration: Skin is not pale.     Findings: No erythema or rash.  Neurological:     Mental Status: She is alert and oriented to person, place, and time.     Cranial Nerves: No cranial nerve deficit.     Coordination: Coordination normal.     Deep Tendon Reflexes: Reflexes are normal and symmetric.  Psychiatric:        Judgment: Judgment normal.    CMP ( most recent) CMP     Component Value Date/Time   NA 134 (L) 10/31/2016 1101   K 4.5 10/31/2016 1101   CL 102 10/31/2016 1101   CO2 23 10/31/2016 1101   GLUCOSE 356 (H) 10/31/2016 1101   BUN 15 05/26/2021 0000   CREATININE 0.8 05/26/2021 0000   CREATININE 0.62 10/31/2016 1101   CALCIUM 9.2 10/31/2016 1101   PROT 7.0 10/31/2016 1101   ALBUMIN 4.0 10/31/2016 1101   AST 9 (L) 10/31/2016 1101   ALT 8 10/31/2016 1101   ALKPHOS 53 10/31/2016 1101   BILITOT 0.4 10/31/2016 1101   GFRNONAA >90 03/31/2013 1045   GFRAA >90 03/31/2013 1045     Diabetic Labs (most recent): Lab Results  Component Value Date   HGBA1C 11.2 05/26/2021   HGBA1C 9.2 10/21/2017   HGBA1C 6.7 07/05/2017     Lipid Panel ( most recent) Lipid Panel     Component Value Date/Time   CHOL 181 05/26/2021 0000    TRIG 80 05/26/2021 0000   HDL 60 05/26/2021 0000   LDLCALC 106 05/26/2021 0000      Lab Results  Component Value Date   TSH 1.25 05/26/2021   TSH 1.38 11/26/2015   TSH 0.656 01/30/2011   FREET4 1.10 01/30/2011           Assessment & Plan:   1. Poorly controlled type 1 diabetes mellitus ([facility])  - Chanice A Raina has currently uncontrolled symptomatic type 2 DM since  35 years of age,  with most recent A1c of 11.2 %. Recent labs reviewed.  Since she has received a Dexcom CGM, she saw improvement in her glycemic profile.  Her AGP report shows 55% in range for the last 14 days.  41% above range, no significant hypoglycemia.  Her average blood glucose 171.  Her recent A1c was 11.2%.  - I had a long discussion with her about the progressive nature of diabetes and the pathology behind its complications. -her diabetes is complicated by nonadherence to follow-ups, chronic smoking and she remains at a high risk for more acute and chronic complications which include CAD, CVA, CKD, retinopathy, and neuropathy. These are all discussed in detail with her.  - I have counseled her on diet  and weight management  by adopting a carbohydrate restricted/protein rich diet. Patient is encouraged to switch to  unprocessed or minimally processed     complex starch and increased protein intake (animal or plant source), fruits, and vegetables. -  she is advised to stick to a routine mealtimes to eat 3 meals  a day and avoid unnecessary snacks ( to snack only to correct hypoglycemia).   - she acknowledges that there is a room for improvement in her food and drink choices. - Suggestion is made for her to avoid simple carbohydrates  from her diet including Cakes, Sweet Desserts, Ice Cream, Soda (diet and regular), Sweet Tea, Candies, Chips, Cookies, Store Bought Juices, Alcohol in Excess of  1-2 drinks a day, Artificial Sweeteners,  Coffee Creamer, and "Sugar-free" Products. This will help patient to have  more stable blood glucose profile and potentially avoid unintended weight gain.  - she will be scheduled with Jearld Fenton, RDN, CDE for diabetes education.  - I have approached her with the following individualized plan to manage  her diabetes and patient agrees:   - she will continue to need intensive treatment with basal/bolus insulin in order for her to achieve and maintain control of diabetes to target.   -Accordingly, she is advised to lower her Lantus to 26 units nightly, increase her NovoLog to 5  units 3 times a day with meals  for pre-meal BG readings of 90-150mg /dl, plus patient specific correction dose for unexpected hyperglycemia above 150mg /dl, associated with strict monitoring of glucose 4 times a day-before meals and at bedtime. - she is warned not to take insulin without proper monitoring per orders. - Adjustment parameters are given to her for hypo and hyperglycemia in writing. - she is encouraged to call clinic for blood glucose levels less than 70 or above 200 mg /dl. Insulin is the exclusive choice she has to treat diabetes.  - Specific targets for  A1c;  LDL, HDL,  and Triglycerides were discussed  with the patient.  2) Blood Pressure /Hypertension:  her blood pressure is  controlled to target.   she is advised to continue her current medications including lisinopril 2.5 mg p.o. daily with breakfast . 3) Lipids/Hyperlipidemia:   Review of her recent lipid panel showed uncontrolled  LDL at 106 .  she  is advised to continue    atorvastatin 10 mg daily at bedtime.  Side effects and precautions discussed with her.  4)  Weight/Diet:  Body mass index is 21.49 kg/m.  -    she is not a candidate for weight loss.   [employer]- Lifestyle Nutrition is discussed and recommended to the patient. Optimal Exercise, Restorative Sleep  information was detailed on discharge instructions.  5) Chronic Care/Health Maintenance:  -she  is on ACEI/ARB and Statin medications  and  is encouraged to initiate and continue to follow up with Ophthalmology, Dentist,  Podiatrist at least yearly or according to recommendations, and advised to  quit smoking. I have recommended yearly flu vaccine and pneumonia vaccine at least every 5 years; moderate intensity exercise for up to 150 minutes weekly; and  sleep for 7- 9 hours a day.  The patient was counseled on the dangers of tobacco use, and was advised to quit.  Reviewed strategies to maximize success, including removing cigarettes and smoking materials from environment.   - she is  advised to maintain close follow up with [place], Tyler Deis, MD for primary care needs, as well as her other providers for optimal and coordinated care.   I spent 81 minutes in the care of the patient today including review of labs from [facility], Lipids, Thyroid Function, Hematology (current and previous including abstractions from other facilities); face-to-face time discussing  her blood glucose readings/logs, discussing hypoglycemia and hyperglycemia episodes and symptoms, medications doses, her options of short and long term treatment based on the latest standards of care / guidelines;  discussion about incorporating lifestyle medicine;  and documenting the encounter.     Please refer to Patient Instructions for Blood Glucose Monitoring and Insulin/Medications Dosing Guide"  in media tab for additional information. Please  also refer to " Patient Self Inventory" in the Media  tab for reviewed elements of pertinent patient history.  Kiana Octavio Manns participated in the discussions, expressed understanding, and voiced agreement with the above plans.  All questions were answered to her satisfaction. she is encouraged to contact clinic should she have any questions or concerns prior to her return visit.   Follow up plan: - Return in about 3 months (around 10/04/2021) for Bring Meter and Logs- A1c in Office.  Glade Lloyd, MD [facility]  [facility] [address] [place], [place] [address] Phone: [phone]  Fax: [phone]    07/06/2021, 1:52 PM  This note was partially dictated with voice recognition software. Similar sounding words can be transcribed inadequately or may not  be corrected upon review.

## 2021-10-05 ENCOUNTER — Ambulatory Visit: Payer: Medicaid Other | Admitting: "Endocrinology

## 2021-11-09 ENCOUNTER — Other Ambulatory Visit: Payer: Self-pay | Admitting: "Endocrinology

## 2021-11-09 ENCOUNTER — Ambulatory Visit: Payer: Medicaid Other | Admitting: "Endocrinology

## 2021-11-09 ENCOUNTER — Ambulatory Visit (INDEPENDENT_AMBULATORY_CARE_PROVIDER_SITE_OTHER): Payer: Medicaid Other | Admitting: "Endocrinology

## 2021-11-09 ENCOUNTER — Encounter: Payer: Self-pay | Admitting: "Endocrinology

## 2021-11-09 VITALS — BP 96/58 | HR 96 | Ht 60.75 in | Wt 112.2 lb

## 2021-11-09 DIAGNOSIS — I10 Essential (primary) hypertension: Secondary | ICD-10-CM

## 2021-11-09 DIAGNOSIS — E782 Mixed hyperlipidemia: Secondary | ICD-10-CM | POA: Diagnosis not present

## 2021-11-09 DIAGNOSIS — E1065 Type 1 diabetes mellitus with hyperglycemia: Secondary | ICD-10-CM | POA: Diagnosis not present

## 2021-11-09 LAB — POCT GLYCOSYLATED HEMOGLOBIN (HGB A1C): HbA1c, POC (controlled diabetic range): 9.8 % — AB (ref 0.0–7.0)

## 2021-11-09 MED ORDER — DEXCOM G6 TRANSMITTER MISC: 1.0000 | 1 refills | Status: DC

## 2021-11-09 MED ORDER — DEXCOM G7 SENSOR MISC: 2 refills | Status: DC

## 2021-11-09 MED ORDER — NOVOLOG FLEXPEN 100 UNIT/ML ~~LOC~~ SOPN: 6.0000 [IU] | PEN_INJECTOR | Freq: Three times a day (TID) | SUBCUTANEOUS | 2 refills | Status: DC

## 2021-11-09 MED ORDER — DEXCOM G7 RECEIVER DEVI: 0 refills | Status: DC

## 2021-11-09 MED ORDER — DEXCOM G6 SENSOR MISC: 4.0000 | 2 refills | Status: DC

## 2021-11-09 MED ORDER — LANTUS SOLOSTAR 100 UNIT/ML ~~LOC~~ SOPN: 30.0000 [IU] | PEN_INJECTOR | Freq: Every day | SUBCUTANEOUS | 2 refills | Status: DC

## 2021-11-09 NOTE — Patient Instructions (Signed)

## 2021-11-09 NOTE — Progress Notes (Signed)
? ?                                                             Endocrinology Consult Note  ?     11/09/2021, 4:45 PM ? ? ?Subjective:  ? ? Patient ID: Traci Ellison, female    DOB: 08-26-1985.  ?Traci Ellison is being seen in consultation for management of currently uncontrolled symptomatic diabetes requested by  Wilburt Finlay, MD. ? ? ?Past Medical History:  ?Diagnosis Date  ? Anxiety   ? 8 months ago  ? Depression   ? 8 months ago  ? Diabetes mellitus   ? type 1--since age 36  ? Diabetes mellitus type I ([place])   ? Headache(784.0)   ? migraines, last 3 days ago-occurs occ.  ? History of kidney stones   ? x 1   ? Vaginal Pap smear, abnormal   ? ? ?Past Surgical History:  ?Procedure Laterality Date  ? CERVICAL BIOPSY  W/ LOOP ELECTRODE EXCISION    ? childbirth     ? x2 -last 3 months ago-[facility] [place] Left 04/03/2013  ? 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 INDUCED ABORTION    ? ? ?Social History  ? ?Socioeconomic History  ? Marital status: Divorced  ?  Spouse name: Not on file  ? Number of children: Not on file  ? Years of education: Not on file  ? Highest education level: Not on file  ?Occupational History  ? Not on file  ?Tobacco Use  ? Smoking status: Every Day  ?  Packs/day: 0.50  ?  Years: 0.50  ?  Pack years: 0.25  ?  Types: Cigarettes  ? Smokeless tobacco: Never  ?Vaping Use  ? Vaping Use: Former  ?Substance and Sexual Activity  ? Alcohol use: No  ? Drug use: No  ? Sexual activity: Yes  ?  Birth control/protection: Condom  ?Other Topics Concern  ? Not on file  ?Social History Narrative  ? Not on file  ? ?Social Determinants of Health  ? ?Financial Resource Strain: Not on file  ?Food Insecurity: Not on file  ?Transportation Needs: Not on file  ?Physical Activity: Not on file  ?Stress: Not on file  ?Social Connections: Not on file   ? ? ?Family History  ?Problem Relation Age of Onset  ? Hypertension Mother   ? Diabetes Mother   ? Heart disease Mother   ? Stroke Mother   ? ? ?Outpatient Encounter Medications as of 11/09/2021  ?Medication Sig  ? [DISCONTINUED] Continuous Blood Gluc Receiver (DEXCOM G7 RECEIVER) DEVI Use to monitor BG continuously  ? [DISCONTINUED] Continuous Blood Gluc Sensor (DEXCOM G7 SENSOR) MISC Change sensor every 10 days  ? atorvastatin (LIPITOR) 10 MG tablet Take 10 mg by mouth at bedtime.  ? busPIRone (BUSPAR) 5 MG tablet Take 10 mg by mouth 3 (three) times daily.  ? FLUoxetine (PROZAC) 10 MG capsule Take 20 mg by mouth daily.  ? glucose blood (ACCU-CHEK ACTIVE STRIPS) test strip Use as instructed  At least 7x daily  ? insulin aspart (NOVOLOG FLEXPEN) 100 UNIT/ML FlexPen Inject 6-9 Units into the  skin 3 (three) times daily with meals.  ? insulin glargine (LANTUS SOLOSTAR) 100 UNIT/ML Solostar Pen Inject 30 Units into the skin at bedtime.  ? Insulin Pen Needle (BD PEN NEEDLE NANO U/F) 32G X 4 MM MISC 1 each by Does not apply route 4 (four) times daily.  ? MICROLET LANCETS MISC Use to test blood glucose up to 7 times a day.  ? RaNITidine HCl (ZANTAC PO) Take by mouth as needed.  ? RESTASIS 0.05 % ophthalmic emulsion 1 drop 2 (two) times daily.  ? traZODone (DESYREL) 50 MG tablet Take 50 mg by mouth at bedtime.  ? [DISCONTINUED] citalopram (CELEXA) 10 MG tablet Take 10-20 mg by mouth daily as needed. For mood stabilization  ? [DISCONTINUED] clonazePAM (KLONOPIN) 1 MG tablet TAKE 1 TABLET (1 MG TOTAL) BY MOUTH NIGHTLY AS NEEDED FOR ANXIETY. (Patient not taking: Reported on 07/06/2021)  ? [DISCONTINUED] cyclobenzaprine (FLEXERIL) 10 MG tablet Take 10 mg by mouth 3 (three) times daily as needed for muscle spasms. (Patient not taking: Reported on 07/06/2021)  ? [DISCONTINUED] divalproex (DEPAKOTE ER) 500 MG 24 hr tablet Take 1,000 mg by mouth at bedtime.  ? [DISCONTINUED] insulin aspart (NOVOLOG FLEXPEN) 100 UNIT/ML FlexPen Inject  5-11 Units into the skin 3 (three) times daily with meals.  ? [DISCONTINUED] insulin glargine (LANTUS SOLOSTAR) 100 UNIT/ML Solostar Pen Inject 26 Units into the skin at bedtime.  ? [DISCONTINUED] Norethindrone-Ethinyl Estradiol-Fe Biphas (LO LOESTRIN FE) 1 MG-10 MCG / 10 MCG tablet Take 1 tablet by mouth daily.  ? [DISCONTINUED] Pediatric Multiple Vit-C-FA (FLINSTONES GUMMIES OMEGA-3 DHA) CHEW Chew by mouth. (Patient not taking: Reported on 07/06/2021)  ? ?No facility-administered encounter medications on file as of 11/09/2021.  ? ? ?ALLERGIES: ?Allergies  ?Allergen Reactions  ? Diclofenac Hives and Itching  ? Nsaids   ? ? ?VACCINATION STATUS: ? ?There is no immunization history on file for this patient. ? ?Diabetes ?She presents for her follow-up diabetic visit. She has type 1 diabetes mellitus. Onset time: She was diagnosed since age 36. Her disease course has been improving. There are no hypoglycemic associated symptoms. Pertinent negatives for hypoglycemia include no confusion, headaches, pallor or seizures. Associated symptoms include fatigue, polydipsia and polyuria. Pertinent negatives for diabetes include no chest pain and no polyphagia. There are no hypoglycemic complications. Symptoms are worsening. There are no diabetic complications. Risk factors for coronary artery disease include dyslipidemia, diabetes mellitus, hypertension, sedentary lifestyle and tobacco exposure. Current diabetic treatment includes insulin injections (She is currently on Lantus 26 units nightly, NovoLog 5 units 3 times daily AC.). Her weight is fluctuating minimally. She is following a generally unhealthy diet. When asked about meal planning, she reported none. Her home blood glucose trend is decreasing steadily. Her breakfast blood glucose range is generally >200 mg/dl. Her lunch blood glucose range is generally >200 mg/dl. Her dinner blood glucose range is generally >200 mg/dl. Her bedtime blood glucose range is generally >200  mg/dl. Her overall blood glucose range is >200 mg/dl. (Since she has received a Dexcom CGM, she saw improvement in her glycemic profile.  Her DEXCOM overview shows 23 % TIR, 35% Level 1 hyperglycemia, 40 % Level 2 hyper. 1 % level and level 2  hypo each. ?EAD 233 for 14 days. ? Her POC a1c is 9.8% improving from  11.2%.) An ACE inhibitor/angiotensin II receptor blocker is being taken.  ?Hyperlipidemia ?This is a chronic problem. The problem is uncontrolled. Pertinent negatives include no chest pain, myalgias or shortness of breath. Current  antihyperlipidemic treatment includes statins. Risk factors for coronary artery disease include dyslipidemia, diabetes mellitus, hypertension, a sedentary lifestyle and family history.  ?Hypertension ?The current episode started more than 1 year ago. Pertinent negatives include no chest pain, headaches, palpitations or shortness of breath. Risk factors for coronary artery disease include diabetes mellitus, dyslipidemia, smoking/tobacco exposure and sedentary lifestyle. Past treatments include ACE inhibitors.  ? ? ?Review of Systems  ?Constitutional:  Positive for fatigue. Negative for chills, fever and unexpected weight change.  ?HENT:  Negative for trouble swallowing and voice change.   ?Eyes:  Negative for visual disturbance.  ?Respiratory:  Negative for cough, shortness of breath and wheezing.   ?Cardiovascular:  Negative for chest pain, palpitations and leg swelling.  ?Gastrointestinal:  Negative for diarrhea, nausea and vomiting.  ?Endocrine: Positive for polydipsia and polyuria. Negative for cold intolerance, heat intolerance and polyphagia.  ?Musculoskeletal:  Negative for arthralgias and myalgias.  ?Skin:  Negative for color change, pallor, rash and wound.  ?Neurological:  Negative for seizures and headaches.  ?Psychiatric/Behavioral:  Negative for confusion and suicidal ideas.   ? ?Objective:  ?  ? ?  11/09/2021  ?  1:41 PM 07/06/2021  ? 10:13 AM 11/01/2017  ?  9:03 AM   ?Vitals with BMI  ?Height 5' 0.75" 5' 0.75" 5\' 2"   ?Weight 112 lbs 3 oz 112 lbs 13 oz 105 lbs  ?BMI 21.38 21.49 19.2  ?Systolic 96 92 [id number]  ?Diastolic 58 58 69  ?Pulse 96 84 101  ? ? ?BP (!) 96/58   Pulse 96   Ht 5' 0.75"

## 2021-11-13 ENCOUNTER — Other Ambulatory Visit: Payer: Self-pay

## 2021-11-13 ENCOUNTER — Encounter: Payer: Self-pay | Admitting: "Endocrinology

## 2021-11-13 DIAGNOSIS — E1065 Type 1 diabetes mellitus with hyperglycemia: Secondary | ICD-10-CM

## 2021-11-13 MED ORDER — DEXCOM G6 SENSOR MISC: 2 refills | Status: DC

## 2021-11-13 MED ORDER — DEXCOM G6 TRANSMITTER MISC: 1.0000 | 1 refills | Status: DC

## 2021-12-04 ENCOUNTER — Telehealth: Payer: Self-pay

## 2021-12-04 NOTE — Telephone Encounter (Signed)
Sent PA request for pt's Dexcom G6 sensors to insurance company through [employer]. ?

## 2021-12-19 ENCOUNTER — Telehealth: Payer: Self-pay

## 2021-12-19 NOTE — Telephone Encounter (Signed)
PA for Dexcom G6 transmitter sent to pt's insurance through [employer]. ?

## 2022-02-14 ENCOUNTER — Ambulatory Visit: Payer: Medicaid Other | Admitting: "Endocrinology

## 2022-06-01 ENCOUNTER — Other Ambulatory Visit: Payer: Self-pay | Admitting: "Endocrinology

## 2022-06-01 DIAGNOSIS — E1065 Type 1 diabetes mellitus with hyperglycemia: Secondary | ICD-10-CM

## 2022-06-20 ENCOUNTER — Telehealth: Payer: Self-pay

## 2022-06-20 ENCOUNTER — Other Ambulatory Visit (HOSPITAL_COMMUNITY): Payer: Self-pay

## 2022-06-20 NOTE — Telephone Encounter (Signed)
Received a PA request from [employer] for pt's Dexcom G6 transmitter.

## 2022-07-15 ENCOUNTER — Ambulatory Visit
Admission: EM | Admit: 2022-07-15 | Discharge: 2022-07-15 | Disposition: A | Discharge status: Home / Self Care | Payer: Medicaid Other | Attending: Nurse Practitioner | Admitting: Nurse Practitioner

## 2022-07-15 DIAGNOSIS — S0501XA Injury of conjunctiva and corneal abrasion without foreign body, right eye, initial encounter: Secondary | ICD-10-CM

## 2022-07-15 MED ORDER — POLYMYXIN B-TRIMETHOPRIM 10000-0.1 UNIT/ML-% OP SOLN: 1.0000 [drp] | Freq: Four times a day (QID) | OPHTHALMIC | 0 refills | Status: AC

## 2022-07-15 NOTE — Discharge Instructions (Signed)
Use eyedrops as prescribed.   Cool compresses to the eyes to help with pain or swelling. Over-the-counter eyedrops such as Visine or Clear Eyes to help lubricate the eye and help with discomfort. Strict handwashing when applying medication.  Avoid rubbing or manipulating the eyes while symptoms persist. As discussed, if you experience sudden loss of vision, change in vision, or other concerns, please follow-up with your eye doctor or go to the [facility] health emergency department for further evaluation. Follow-up with your eye doctor if symptoms fail to improve or if they suddenly worsen.

## 2022-07-15 NOTE — ED Provider Notes (Signed)
[facility]    CSN: [id number] Arrival date & time: 07/15/22  1028      History   Chief Complaint Chief Complaint  Patient presents with   Eye Problem    HPI Traci Ellison is a 36 y.o. female.   The history is provided by the patient.   Patient presents for complaints of right eye pain after she states that her son may have scratched her eye or his hair may have scratched her eye getting him out of the car seat.  Since the injury occurred, she complains of pain to the right eye, photophobia, and tearing.  She states that she feels like there is a "film" over the eye, but she is still able to see out of it.  She states that she has also use warm compresses, and irrigated the eye with minimal relief.  She states that she wears glasses.  Past Medical History:  Diagnosis Date   Anxiety    8 months ago   Depression    8 months ago   Diabetes mellitus    type 1--since age 73   Diabetes mellitus type I (HCC)    Headache(784.0)    migraines, last 3 days ago-occurs occ.   History of kidney stones    x 1    Vaginal Pap smear, abnormal     Patient Active Problem List   Diagnosis Date Noted   Poorly controlled type 1 diabetes mellitus (HCC) 07/06/2021   Essential hypertension, benign 07/06/2021   Mixed hyperlipidemia 07/06/2021   Personal history of noncompliance with medical treatment, presenting hazards to health 03/20/2017   Current smoker 09/28/2016   Uncontrolled type 1 diabetes mellitus with complication 09/21/2016   Pre-existing type 1 diabetes mellitus in pregnancy in first trimester 05/13/2012   Sebaceous cyst 04/28/2012    Past Surgical History:  Procedure Laterality Date   CERVICAL BIOPSY  W/ LOOP ELECTRODE EXCISION     childbirth      x2 -last 3 months ago-[facility]   CYST REMOVAL TRUNK Left 04/03/2013   Procedure: EXCISION CHRONIC SEBACEOUS CYST LEFT UPPER CHEST WALL;  Surgeon: Shelly Rubenstein, MD;  Location: [facility];  Service:  General;  Laterality: Left;   INDUCED ABORTION      OB History     Gravida  6   Para  2   Term  2   Preterm      AB  3   Living  2      SAB  2   IAB  1   Ectopic      Multiple      Live Births               Home Medications    Prior to Admission medications   Medication Sig Start Date End Date Taking? Authorizing Provider  trimethoprim-polymyxin b (POLYTRIM) ophthalmic solution Place 1 drop into the right eye every 6 (six) hours for 7 days. 07/15/22 07/22/22 Yes Aydan Phoenix-Warren, Sadie Haber, NP  atorvastatin (LIPITOR) 10 MG tablet Take 10 mg by mouth at bedtime. 06/22/21   [provider]  busPIRone (BUSPAR) 5 MG tablet Take 10 mg by mouth 3 (three) times daily. 05/25/21   [provider]  Continuous Blood Gluc Sensor (DEXCOM G6 SENSOR) MISC CHANGE SENSOR EVERY 10 DAYS 11/13/21   Roma Kayser, MD  Continuous Blood Gluc Transmit (DEXCOM G6 TRANSMITTER) MISC 1 Piece by Does not apply route as directed. 11/13/21   Roma Kayser,  MD  FLUoxetine (PROZAC) 10 MG capsule Take 20 mg by mouth daily. 06/19/21   [provider]  glucose blood (ACCU-CHEK ACTIVE STRIPS) test strip Use as instructed  At least 7x daily 09/23/18   Nida, Denman George, MD  insulin aspart (NOVOLOG FLEXPEN) 100 UNIT/ML FlexPen Inject 6-9 Units into the skin 3 (three) times daily with meals. 11/09/21   Roma Kayser, MD  insulin glargine (LANTUS SOLOSTAR) 100 UNIT/ML Solostar Pen Inject 30 Units into the skin at bedtime. 11/09/21   Roma Kayser, MD  Insulin Pen Needle (BD PEN NEEDLE NANO U/F) 32G X 4 MM MISC 1 each by Does not apply route 4 (four) times daily. 09/28/16   Roma Kayser, MD  MICROLET LANCETS MISC Use to test blood glucose up to 7 times a day. 09/23/18   Roma Kayser, MD  RaNITidine HCl (ZANTAC PO) Take by mouth as needed.    [provider]  RESTASIS 0.05 % ophthalmic emulsion 1 drop 2 (two) times daily. 06/09/21    [provider]  traZODone (DESYREL) 50 MG tablet Take 50 mg by mouth at bedtime. 08/22/21   [provider]  citalopram (CELEXA) 10 MG tablet Take 10-20 mg by mouth daily as needed. For mood stabilization  10/26/11  [provider]  divalproex (DEPAKOTE ER) 500 MG 24 hr tablet Take 1,000 mg by mouth at bedtime.  10/26/11  [provider]  Norethindrone-Ethinyl Estradiol-Fe Biphas (LO LOESTRIN FE) 1 MG-10 MCG / 10 MCG tablet Take 1 tablet by mouth daily.  10/26/11  [provider]    Family History Family History  Problem Relation Age of Onset   Hypertension Mother    Diabetes Mother    Heart disease Mother    Stroke Mother     Social History Social History   Tobacco Use   Smoking status: Every Day    Packs/day: 0.50    Years: 0.50    Total pack years: 0.25    Types: Cigarettes   Smokeless tobacco: Never  Vaping Use   Vaping Use: Former  Substance Use Topics   Alcohol use: No   Drug use: No     Allergies   Diclofenac and Nsaids   Review of Systems Review of Systems Per HPI  Physical Exam Triage Vital Signs ED Triage Vitals  Enc Vitals Group     BP 07/15/22 1123 90/66     Pulse Rate 07/15/22 1123 96     Resp 07/15/22 1123 18     Temp 07/15/22 1123 97.8 F (36.6 C)     Temp Source 07/15/22 1123 Oral     SpO2 07/15/22 1123 99 %     Weight --      Height --      Head Circumference --      Peak Flow --      Pain Score 07/15/22 1124 8     Pain Loc --      Pain Edu? --      Excl. in [place]? --    No data found.  Updated Vital Signs BP 90/66 (BP Location: Right Arm)   Pulse 96   Temp 97.8 F (36.6 C) (Oral)   Resp 18   LMP  (Within Weeks) Comment: 3-4 weeks  SpO2 99%   Breastfeeding No   Visual Acuity Right Eye Distance: 20/60 (Without correction) Left Eye Distance: 20/60 (Without correction) Bilateral Distance: 20/50 (Without correction)  Right Eye Near:   Left Eye Near:  Bilateral Near:     Physical  Exam Vitals and nursing note reviewed.  Constitutional:      Appearance: Normal appearance. She is not toxic-appearing.  HENT:     Head: Normocephalic.     Right Ear: Tympanic membrane, ear canal and external ear normal.     Left Ear: Tympanic membrane, ear canal and external ear normal.     Mouth/Throat:     Mouth: Mucous membranes are moist.  Eyes:     General: Lids are normal. Lids are everted, no foreign bodies appreciated. Vision grossly intact. No visual field deficit.       Right eye: Discharge (tearing present) present. No foreign body.     Extraocular Movements: Extraocular movements intact.     Right eye: Normal extraocular motion and no nystagmus.     Conjunctiva/sclera:     Right eye: Right conjunctiva is not injected. No chemosis.    Pupils: Pupils are equal, round, and reactive to light.     Comments: Eye Exam: Eyelids of right eye everted and swept for foreign body. The eye was anesthetized with 2 drops of Tetracaine and stained with fluorescein. Examination under woods lamp does reveal a foreign body or area of increased stain uptake at the 12 o'clock position. The eye was then irrigated copiously with saline.   Cardiovascular:     Rate and Rhythm: Normal rate.  Pulmonary:     Effort: Pulmonary effort is normal.  Skin:    General: Skin is warm and dry.  Neurological:     General: No focal deficit present.     Mental Status: She is alert and oriented to person, place, and time.  Psychiatric:        Mood and Affect: Mood normal.        Behavior: Behavior normal.      UC Treatments / Results  Labs (all labs ordered are listed, but only abnormal results are displayed) Labs Reviewed - No data to display  EKG   Radiology No results found.  Procedures Procedures (including critical care time)  Medications Ordered in UC Medications - No data to display  Initial Impression / Assessment and Plan / UC Course  I have reviewed the triage vital signs and the  nursing notes.  Pertinent labs & imaging results that were available during my care of the patient were reviewed by me and considered in my medical decision making (see chart for details).  The patient is well-appearing, she is in no acute distress, vital signs are stable.  Corneal abrasion of the right eye seen on exam.  Will start patient on trimethoprim-polymyxin B eyedrops.  Supportive care recommendations were also provided to the patient to include cool compresses, and avoiding rubbing or manipulating the eye while symptoms persist.  Patient was given strict follow-up precautions, advised to follow-up with her the neurologist if her eye symptoms worsen.  Patient was given note for work.  Patient verbalizes understanding.  All questions were answered.  Patient stable for discharge. Final Clinical Impressions(s) / UC Diagnoses   Final diagnoses:  Right corneal abrasion, initial encounter     Discharge Instructions      Use eyedrops as prescribed.   Cool compresses to the eyes to help with pain or swelling. Over-the-counter eyedrops such as Visine or Clear Eyes to help lubricate the eye and help with discomfort. Strict handwashing when applying medication.  Avoid rubbing or manipulating the eyes while symptoms persist. As discussed, if you experience sudden loss of  vision, change in vision, or other concerns, please follow-up with your eye doctor or go to the [facility] health emergency department for further evaluation. Follow-up with your eye doctor if symptoms fail to improve or if they suddenly worsen.     ED Prescriptions     Medication Sig Dispense Auth. Provider   trimethoprim-polymyxin b (POLYTRIM) ophthalmic solution Place 1 drop into the right eye every 6 (six) hours for 7 days. 10 mL Juron Vorhees-Warren, Sadie Haber, NP      PDMP not reviewed this encounter.   Abran Cantor, NP 07/15/22 [phone]

## 2022-07-15 NOTE — ED Triage Notes (Signed)
Pt reports her son scratched the right eye yesterday and reports burning sensation and discomfort in right eye.

## 2022-10-18 ENCOUNTER — Ambulatory Visit (INDEPENDENT_AMBULATORY_CARE_PROVIDER_SITE_OTHER): Payer: Medicaid Other

## 2022-10-18 ENCOUNTER — Encounter: Payer: Self-pay | Admitting: Emergency Medicine

## 2022-10-18 ENCOUNTER — Ambulatory Visit
Admission: EM | Admit: 2022-10-18 | Discharge: 2022-10-18 | Disposition: A | Discharge status: Home / Self Care | Payer: Medicaid Other | Attending: Nurse Practitioner | Admitting: Nurse Practitioner

## 2022-10-18 DIAGNOSIS — M79641 Pain in right hand: Secondary | ICD-10-CM | POA: Diagnosis not present

## 2022-10-18 DIAGNOSIS — S63694A Other sprain of right ring finger, initial encounter: Secondary | ICD-10-CM | POA: Diagnosis not present

## 2022-10-18 NOTE — ED Triage Notes (Signed)
Jammed right hand into a steel cart today.  States pain shoots from hand up to arm.

## 2022-10-18 NOTE — Discharge Instructions (Addendum)
As we discussed, the x-ray today does not show any broken bones in your hand You may have a sprained ring finger Please wear the finger splint If pain is not improved over the next 1 to 2 weeks, follow-up with orthopedic provider-contact information is below You can take Tylenol 500 to 1000 mg every 6 hours as needed for pain Please rest, keep your hand elevated, and apply ice every hour for 15 minutes on, 45 minutes off while awake

## 2022-10-18 NOTE — ED Provider Notes (Signed)
[facility]    CSN: [id number] Arrival date & time: 10/18/22  1351      History   Chief Complaint No chief complaint on file.   HPI Traci Ellison is a 37 y.o. female.   Patient presents today for right hand pain that began approximately 4 hours ago after she accidentally jammed her right hand into a steel cart.  No open wounds, bruising, redness, or swelling.  Reports the area is tender to touch and painful with movement.  Reports after incident, she applied ice, and pain improved.  Reports after few hours, the pain got worse and is now shooting up her arm and so she came in to be seen.  No medications taken prior to arrival today.    Past Medical History:  Diagnosis Date   Anxiety    8 months ago   Depression    8 months ago   Diabetes mellitus    type 1--since age 45   Diabetes mellitus type I ([place])    Headache(784.0)    migraines, last 3 days ago-occurs occ.   History of kidney stones    x 1    Vaginal Pap smear, abnormal     Patient Active Problem List   Diagnosis Date Noted   Poorly controlled type 1 diabetes mellitus ([place]) 07/06/2021   Essential hypertension, benign 07/06/2021   Mixed hyperlipidemia 07/06/2021   Personal history of noncompliance with medical treatment, presenting hazards to health 03/20/2017   Current smoker 09/28/2016   Uncontrolled type 1 diabetes mellitus with complication [id number]   Pre-existing type 1 diabetes mellitus in pregnancy in first trimester 05/13/2012   Sebaceous cyst 04/28/2012    Past Surgical History:  Procedure Laterality Date   CERVICAL BIOPSY  W/ LOOP ELECTRODE EXCISION     childbirth      x2 -last 3 months ago-[facility] Left 04/03/2013   Procedure: EXCISION CHRONIC SEBACEOUS CYST LEFT UPPER CHEST WALL;  Surgeon: Harl Bowie, MD;  Location: [facility];  Service: General;  Laterality: Left;   INDUCED ABORTION      OB History     Gravida  6   Para  2   Term  2    Preterm      AB  3   Living  2      SAB  2   IAB  1   Ectopic      Multiple      Live Births               Home Medications    Prior to Admission medications   Medication Sig Start Date End Date Taking? Authorizing Provider  atorvastatin (LIPITOR) 10 MG tablet Take 10 mg by mouth at bedtime. 06/22/21   [provider]  busPIRone (BUSPAR) 5 MG tablet Take 10 mg by mouth 3 (three) times daily. 05/25/21   [provider]  Continuous Blood Gluc Sensor (DEXCOM G6 SENSOR) MISC CHANGE SENSOR EVERY 10 DAYS 11/13/21   Cassandria Anger, MD  Continuous Blood Gluc Transmit (DEXCOM G6 TRANSMITTER) MISC 1 Piece by Does not apply route as directed. 11/13/21   Cassandria Anger, MD  FLUoxetine (PROZAC) 10 MG capsule Take 20 mg by mouth daily. 06/19/21   [provider]  glucose blood (ACCU-CHEK ACTIVE STRIPS) test strip Use as instructed  At least 7x daily 09/23/18   Cassandria Anger, MD  insulin aspart (NOVOLOG FLEXPEN) 100 UNIT/ML FlexPen Inject 6-9  Units into the skin 3 (three) times daily with meals. 11/09/21   Cassandria Anger, MD  insulin glargine (LANTUS SOLOSTAR) 100 UNIT/ML Solostar Pen Inject 30 Units into the skin at bedtime. 11/09/21   Cassandria Anger, MD  Insulin Pen Needle (BD PEN NEEDLE NANO U/F) 32G X 4 MM MISC 1 each by Does not apply route 4 (four) times daily. 09/28/16   Cassandria Anger, MD  MICROLET LANCETS MISC Use to test blood glucose up to 7 times a day. 09/23/18   Cassandria Anger, MD  RaNITidine HCl (ZANTAC PO) Take by mouth as needed.    [provider]  RESTASIS 0.05 % ophthalmic emulsion 1 drop 2 (two) times daily. 06/09/21   [provider]  traZODone (DESYREL) 50 MG tablet Take 50 mg by mouth at bedtime. 08/22/21   [provider]  citalopram (CELEXA) 10 MG tablet Take 10-20 mg by mouth daily as needed. For mood stabilization  10/26/11  [provider]  divalproex (DEPAKOTE  ER) 500 MG 24 hr tablet Take 1,000 mg by mouth at bedtime.  10/26/11  [provider]  Norethindrone-Ethinyl Estradiol-Fe Biphas (LO LOESTRIN FE) 1 MG-10 MCG / 10 MCG tablet Take 1 tablet by mouth daily.  10/26/11  [provider]    Family History Family History  Problem Relation Age of Onset   Hypertension Mother    Diabetes Mother    Heart disease Mother    Stroke Mother     Social History Social History   Tobacco Use   Smoking status: Every Day    Packs/day: 0.50    Years: 0.50    Additional pack years: 0.00    Total pack years: 0.25    Types: Cigarettes   Smokeless tobacco: Never  Vaping Use   Vaping Use: Former  Substance Use Topics   Alcohol use: No   Drug use: No     Allergies   Diclofenac and Nsaids   Review of Systems Review of Systems Per HPI  Physical Exam Triage Vital Signs ED Triage Vitals  Enc Vitals Group     BP 10/18/22 1358 107/70     Pulse Rate 10/18/22 1358 85     Resp 10/18/22 1358 18     Temp 10/18/22 1358 97.9 F (36.6 C)     Temp Source 10/18/22 1358 Oral     SpO2 10/18/22 1358 97 %     Weight --      Height --      Head Circumference --      Peak Flow --      Pain Score 10/18/22 1359 8     Pain Loc --      Pain Edu? --      Excl. in [place]? --    No data found.  Updated Vital Signs BP 107/70 (BP Location: Left Arm)   Pulse 85   Temp 97.9 F (36.6 C) (Oral)   Resp 18   LMP 09/21/2022 (Exact Date)   SpO2 97%   Visual Acuity Right Eye Distance:   Left Eye Distance:   Bilateral Distance:    Right Eye Near:   Left Eye Near:    Bilateral Near:     Physical Exam Vitals and nursing note reviewed.  Constitutional:      General: She is not in acute distress.    Appearance: Normal appearance. She is not toxic-appearing.  HENT:     Mouth/Throat:     Mouth: Mucous membranes  are moist.     Pharynx: Oropharynx is clear.  Pulmonary:     Effort: Pulmonary effort is normal. No respiratory distress.   Musculoskeletal:     Right wrist: Normal. Normal pulse.     Left wrist: Normal. Normal pulse.     Right hand: Tenderness present. No swelling or bony tenderness. Normal range of motion. Normal strength. Normal sensation. There is no disruption of two-point discrimination. Normal capillary refill. Normal pulse.     Left hand: Normal. No swelling, tenderness or bony tenderness. Normal range of motion. Normal strength. Normal sensation. There is no disruption of two-point discrimination. Normal capillary refill. Normal pulse.       Hands:     Comments: Inspection: no swelling, obvious deformity, or redness to right hand Palpation: right hand tender to palpation in area marked; no obvious deformities palpated ROM: Full ROM bilateral hands Strength: 5/5 bilateral hands Neurovascular: neurovascularly intact in left and right upper extremity    Skin:    General: Skin is warm and dry.     Capillary Refill: Capillary refill takes less than 2 seconds.     Coloration: Skin is not jaundiced or pale.     Findings: No erythema.  Neurological:     Mental Status: She is alert and oriented to person, place, and time.  Psychiatric:        Behavior: Behavior is cooperative.      UC Treatments / Results  Labs (all labs ordered are listed, but only abnormal results are displayed) Labs Reviewed - No data to display  EKG   Radiology DG Hand Complete Right  Result Date: 10/18/2022 CLINICAL DATA:  Jammed hand in distilled cart today. Pain shoots from hand into arm. EXAM: RIGHT HAND - COMPLETE 3+ VIEW COMPARISON:  None available. FINDINGS: There is no evidence of fracture or dislocation. There is no evidence of arthropathy or other focal bone abnormality. Soft tissues are unremarkable. IMPRESSION: Negative. Electronically Signed   By: Zetta Bills M.D.   On: 10/18/2022 14:20    Procedures Procedures (including critical care time)  Medications Ordered in UC Medications - No data to  display  Initial Impression / Assessment and Plan / UC Course  I have reviewed the triage vital signs and the nursing notes.  Pertinent labs & imaging results that were available during my care of the patient were reviewed by me and considered in my medical decision making (see chart for details).   Patient is well-appearing, normotensive, afebrile, not tachycardic, not tachypneic, oxygenating well on room air.    1. Other sprain of right ring finger, initial encounter 2. Right hand pain Hand x-ray today is negative for acute bony abnormality Suspect strain of 4th digit given significant pain Supportive care discussed Start Tylenol, rest, ice, elevation Follow up with Hand Specialist if symptoms persist or worsen despite treatment Note given for work ER precautions discussed   The patient was given the opportunity to ask questions.  All questions answered to their satisfaction.  The patient is in agreement to this plan.    Final Clinical Impressions(s) / UC Diagnoses   Final diagnoses:  Other sprain of right ring finger, initial encounter  Right hand pain     Discharge Instructions      As we discussed, the x-ray today does not show any broken bones in your hand You may have a sprained ring finger Please wear the finger splint If pain is not improved over the next 1 to 2 weeks, follow-up  with orthopedic provider-contact information is below You can take Tylenol 500 to 1000 mg every 6 hours as needed for pain Please rest, keep your hand elevated, and apply ice every hour for 15 minutes on, 45 minutes off while awake    ED Prescriptions   None    PDMP not reviewed this encounter.   Eulogio Bear, NP 10/18/22 [phone]

## 2023-05-30 ENCOUNTER — Ambulatory Visit (INDEPENDENT_AMBULATORY_CARE_PROVIDER_SITE_OTHER): Payer: Medicaid Other | Admitting: Internal Medicine

## 2023-05-30 ENCOUNTER — Encounter: Payer: Self-pay | Admitting: Internal Medicine

## 2023-05-30 VITALS — BP 110/68 | HR 68 | Ht 60.75 in | Wt 110.2 lb

## 2023-05-30 DIAGNOSIS — E1065 Type 1 diabetes mellitus with hyperglycemia: Secondary | ICD-10-CM

## 2023-05-30 LAB — BASIC METABOLIC PANEL
BUN: 12 mg/dL (ref 6–23)
CO2: 30 meq/L (ref 19–32)
Calcium: 9.8 mg/dL (ref 8.4–10.5)
Chloride: 105 meq/L (ref 96–112)
Creatinine, Ser: 0.72 mg/dL (ref 0.40–1.20)
GFR: 106.57 mL/min (ref >60.00)
Glucose, Bld: 67 mg/dL — ABNORMAL LOW (ref 70–99)
Potassium: 3.8 meq/L (ref 3.5–5.1)
Sodium: 140 meq/L (ref 135–145)

## 2023-05-30 LAB — MICROALBUMIN / CREATININE URINE RATIO
Creatinine,U: 60.3 mg/dL
Microalb Creat Ratio: 1.7 mg/g (ref 0.0–30.0)
Microalb, Ur: 1 mg/dL (ref 0.0–1.9)

## 2023-05-30 LAB — POCT GLYCOSYLATED HEMOGLOBIN (HGB A1C): Hemoglobin A1C: 9.6 % — AB (ref 4.0–5.6)

## 2023-05-30 LAB — LIPID PANEL
Cholesterol: 148 mg/dL (ref 0–200)
HDL: 44 mg/dL (ref >39.00)
LDL Cholesterol: 94 mg/dL (ref 0–99)
NonHDL: 104.13
Total CHOL/HDL Ratio: 3
Triglycerides: 53 mg/dL (ref 0.0–149.0)
VLDL: 10.6 mg/dL (ref 0.0–40.0)

## 2023-05-30 LAB — T4, FREE: Free T4: 0.7 ng/dL (ref 0.60–1.60)

## 2023-05-30 LAB — TSH: TSH: 2.29 u[IU]/mL (ref 0.35–5.50)

## 2023-05-30 LAB — POCT GLUCOSE (DEVICE FOR HOME USE): Glucose Fasting, POC: 113 mg/dL — AB (ref 70–99)

## 2023-05-30 MED ORDER — LANTUS SOLOSTAR 100 UNIT/ML ~~LOC~~ SOPN: 30.0000 [IU] | PEN_INJECTOR | Freq: Every day | SUBCUTANEOUS | 2 refills | Status: DC

## 2023-05-30 MED ORDER — OMNIPOD 5 G7 INTRO (GEN 5) KIT: 1.0000 | PACK | 0 refills | Status: DC

## 2023-05-30 MED ORDER — NOVOLOG FLEXPEN 100 UNIT/ML ~~LOC~~ SOPN: 6.0000 [IU] | PEN_INJECTOR | Freq: Three times a day (TID) | SUBCUTANEOUS | 2 refills | Status: DC

## 2023-05-30 MED ORDER — BD PEN NEEDLE NANO U/F 32G X 4 MM MISC: 1.0000 | Freq: Four times a day (QID) | 2 refills | Status: DC

## 2023-05-30 MED ORDER — OMNIPOD 5 G7 PODS (GEN 5) MISC: 1.0000 | 3 refills | Status: DC

## 2023-05-30 MED ORDER — DEXCOM G7 SENSOR MISC: 1.0000 | 3 refills | Status: DC

## 2023-05-30 NOTE — Progress Notes (Signed)
Name: Traci Ellison  MRN/ DOB: [id number], [birth year]   Age/ Sex: 37 y.o., female    PCP: Beatrix Fetters, MD   Reason for Endocrinology Evaluation: Type 1 Diabetes Mellitus     Date of Initial Endocrinology Visit: 05/30/2023     PATIENT IDENTIFIER: Traci Ellison is a 37 y.o. female with a past medical history of DM. The patient presented for initial endocrinology clinic visit on 05/30/2023 for consultative assistance with her diabetes management.    HPI: Traci Ellison was    Diagnosed with DM at age 50 Prior Medications tried/Intolerance: Used to be on the Medtronic pump as a child Currently checking blood sugars 0 x / day Hypoglycemia episodes : yes                Symptoms: yes                 Hemoglobin A1c has ranged from 9.8% in 2023, peaking at 11.2% in 2022.  In terms of diet, the patient eats 1-2 meals a day, does not snack, drinks rare regular soda.   Has noted hypoglycemia symptoms between 1-3 AM , unknown glucose data.     Denies nausea or vomiting  Has alternating bowel movement   HOME DIABETES REGIMEN: Lantus 30 units daily  Novolog 5 units TIDQAC   Statin: yes ACE-I/ARB: no   METER DOWNLOAD SUMMARY:n/a   DIABETIC COMPLICATIONS: Microvascular complications:   Denies: DR, retinopathy, neuropathy  Last eye exam: Completed 07/2022  Macrovascular complications:   Denies: CAD, PVD, CVA   PAST HISTORY: Past Medical History:  Past Medical History:  Diagnosis Date   Anxiety    8 months ago   Depression    8 months ago   Diabetes mellitus    type 1--since age 4   Diabetes mellitus type I (HCC)    Headache(784.0)    migraines, last 3 days ago-occurs occ.   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 She has a 0.3 pack-year smoking history. She has never used smokeless tobacco. She reports that she does not drink alcohol and does not use drugs. Family History:  Family History  Problem Relation Age of Onset   Hypertension Mother    Diabetes Mother    Heart disease Mother    Stroke Mother      HOME MEDICATIONS: Allergies as of 05/30/2023       Reactions   Diclofenac Hives, Itching   Nsaids         Medication List        Accurate as of May 30, 2023  8:24 AM. If you have any questions, ask your nurse or doctor.          atorvastatin 10 MG tablet Commonly known as: LIPITOR Take 10 mg by mouth at bedtime.   busPIRone 5 MG tablet Commonly known as: BUSPAR Take 10 mg by mouth 3 (  three) times daily.   Dexcom G6 Sensor Misc CHANGE SENSOR EVERY 10 DAYS   Dexcom G6 Transmitter Misc 1 Piece by Does not apply route as directed.   FLUoxetine 20 MG capsule Commonly known as: PROZAC Take 1 tablet by mouth 2 (two) times daily.   FLUoxetine 10 MG capsule Commonly known as: PROZAC Take 20 mg by mouth daily.   glucose blood test strip Commonly known as: ACCU-CHEK ACTIVE STRIPS Use as instructed  At least 7x daily   Insulin Pen Needle 32G X 4 MM Misc Commonly known as: BD Pen Needle Nano U/F 1 each by Does not apply route 4 (four) times daily.   Lantus SoloStar 100 UNIT/ML Solostar Pen Generic drug: insulin glargine Inject 30 Units into the skin at bedtime.   levonorgestrel 20 MCG/DAY Iud Commonly known as: MIRENA by Intrauterine route.   lisinopril 2.5 MG tablet Commonly known as: ZESTRIL Take 1 tablet by mouth daily.   Microlet Lancets Misc Use to test blood glucose up to 7 times a day.   NovoLOG  FlexPen 100 UNIT/ML FlexPen Generic drug: insulin aspart Inject 6-9 Units into the skin 3 (three) times daily with meals.   Restasis 0.05 % ophthalmic emulsion Generic drug: cycloSPORINE 1 drop 2 (two) times daily.   traZODone 50 MG tablet Commonly known as: DESYREL Take 50 mg by mouth at bedtime.   ZANTAC PO Take by mouth as needed.         ALLERGIES: Allergies  Allergen Reactions   Diclofenac Hives and Itching   Nsaids      REVIEW OF SYSTEMS: A comprehensive ROS was conducted with the patient and is negative except as per HPI and    OBJECTIVE:   VITAL SIGNS: BP 110/68 (BP Location: Left Arm, Patient Position: Sitting, Cuff Size: Small)   Pulse 68   Ht 5' 0.75" (1.543 m)   Wt 110 lb 3.2 oz (50 kg)   SpO2 98%   BMI 20.99 kg/m    PHYSICAL EXAM:  General: Pt appears well and is in NAD  Neck: General: Supple without adenopathy or carotid bruits. Thyroid: Thyroid size normal.  No goiter or nodules appreciated.   Lungs: Clear with good BS bilat   Heart: RRR   Abdomen:  soft, nontender  Extremities:  Lower extremities - No pretibial edema.   Neuro: MS is good with appropriate affect, pt is alert and Ox3    DM foot exam: 05/30/2023  The skin of the feet is intact without sores or ulcerations. The pedal pulses are 2+ on right and 2+ on left. The sensation is intact to a screening 5.07, 10 gram monofilament bilaterally   DATA REVIEWED:  Lab Results  Component Value Date   HGBA1C 9.6 (A) 05/30/2023   HGBA1C 9.8 (A) 11/09/2021   HGBA1C 11.2 05/26/2021    Latest Reference Range & Units 05/30/23 09:09  Sodium 135 - 145 mEq/L 140  Potassium 3.5 - 5.1 mEq/L 3.8  Chloride 96 - 112 mEq/L 105  CO2 19 - 32 mEq/L 30  Glucose 70 - 99 mg/dL 67 (L)  BUN 6 - 23 mg/dL 12  Creatinine 0.10 - 2.72 mg/dL 5.36  Calcium 8.4 - 64.4 mg/dL 9.8  GFR >03.47 mL/min 106.57  Total CHOL/HDL Ratio  3  Cholesterol 0 - 200 mg/dL 425  HDL Cholesterol >95.63 mg/dL 87.56  LDL  (calc) 0 - 99 mg/dL 94  MICROALB/CREAT RATIO 0.0 - 30.0 mg/g 1.7  NonHDL  104.13  Triglycerides 0.0 - 149.0  mg/dL 08.6  VLDL 0.0 - 57.8 mg/dL 46.9    Latest Reference Range & Units 05/30/23 09:09  TSH 0.35 - 5.50 uIU/mL 2.29  T4,Free(Direct) 0.60 - 1.60 ng/dL 6.29     Latest Reference Range & Units 05/30/23 09:09  Creatinine,U mg/dL 52.8  Microalb, Ur 0.0 - 1.9 mg/dL 1.0  MICROALB/CREAT RATIO 0.0 - 30.0 mg/g 1.7    ASSESSMENT / PLAN / RECOMMENDATIONS:   1) Type 1 Diabetes Mellitus, Poorly controlled, Without  complications - Most recent A1c of 9.6 %. Goal A1c < 7.0 %.    Plan: GENERAL: I have discussed with the patient the pathophysiology of diabetes. We went over the natural progression of the disease. We stressed the importance of lifestyle changes. I explained the complications associated with diabetes including retinopathy, nephropathy, neuropathy as well as increased risk of cardiovascular disease. We went over the benefit seen with glycemic control.   I explained to the patient that diabetic patients are at higher than normal risk for amputations.  She is to be on the Medtronic pump as a child, we discussed the OmniPod, she is interested, a new prescription has been sent in a refill to our CDE has been placed Dexcom G7 prescription have also been sent to the pharmacy Patient endorses symptoms of hypoglycemia overnight, will decrease basal insulin Patient has not been counting carbohydrates correctly, we opted with a standing dose at this time, I be happy to refer her to our CDE for carb counting when necessary in the future She will also be provided with a correction scale to be used before each meal as below BMP showed hyperglycemia, GFR is normal, with normal TFTs and MA/CR ratio  MEDICATIONS: Decrease Lantus 26 units daily Increase NovoLog 6 units 3 times daily before every meal Start CF: NovoLog (BG -130/45) TIDQAC   EDUCATION / INSTRUCTIONS: BG monitoring  instructions: Patient is instructed to check her blood sugars 3 times a day. Call [place] [facility] if: BG persistently < 70  I reviewed the Rule of 15 for the treatment of hypoglycemia in detail with the patient. Literature supplied.   2) Diabetic complications:  Eye: Does not have known diabetic retinopathy.  Neuro/ Feet: Does not have known diabetic peripheral neuropathy. Renal: Patient does not have known baseline CKD. She is not on an ACEI/ARB at present.  3) Dyslipidemia :  -Lipid panel acceptable  Medication Continue atorvastatin 10 mg daily Follow-up in 3 months  Signed electronically by: Lyndle Herrlich, MD  [facility]  [facility] [address] [place] [place], [place] [address] Phone: [phone] FAX: [phone]   CC: Beatrix Fetters, MD [address] [place] [address] Phone: [phone]  Fax: [phone]    Return to Endocrinology clinic as below: Future Appointments  Date Time Provider Department Center  05/30/2023  8:30 AM Kennedie Pardoe, Konrad Dolores, MD [facility] None

## 2023-05-30 NOTE — Patient Instructions (Signed)
Lantus 26 units once daily NovoLog 6 units with each meal Novolog correctional insulin: ADD extra units on insulin to your meal-time Novolog dose if your blood sugars are higher than 175. Use the scale below to help guide you:   Blood sugar before meal Number of units to inject  Less than 175 0 unit  176 -  220 1 units  221 -  265 2 units  266 -  310 3 units  311 -  355 4 units  356 -  400 5 units  401 -  445 6 units  446 -  490 7 units  491 -  535 8 units   HOW TO TREAT LOW BLOOD SUGARS (Blood sugar LESS THAN 70 MG/DL) Please follow the RULE OF 15 for the treatment of hypoglycemia treatment (when your (blood sugars are less than 70 mg/dL)   STEP 1: Take 15 grams of carbohydrates when your blood sugar is low, which includes:  3-4 GLUCOSE TABS  OR 3-4 OZ OF JUICE OR REGULAR SODA OR ONE TUBE OF GLUCOSE GEL    STEP 2: RECHECK blood sugar in 15 MINUTES STEP 3: If your blood sugar is still low at the 15 minute recheck --> then, go back to STEP 1 and treat AGAIN with another 15 grams of carbohydrates.

## 2023-05-31 MED ORDER — ATORVASTATIN CALCIUM 10 MG PO TABS: 10.0000 mg | ORAL_TABLET | Freq: Every day | ORAL | 3 refills | Status: DC

## 2023-06-05 ENCOUNTER — Encounter: Payer: Self-pay | Admitting: Internal Medicine

## 2023-06-05 ENCOUNTER — Other Ambulatory Visit: Payer: Self-pay | Admitting: Internal Medicine

## 2023-06-05 ENCOUNTER — Telehealth: Payer: Self-pay

## 2023-06-05 DIAGNOSIS — E1065 Type 1 diabetes mellitus with hyperglycemia: Secondary | ICD-10-CM

## 2023-06-05 NOTE — Telephone Encounter (Signed)
PA needed on Omnipod

## 2023-06-06 ENCOUNTER — Other Ambulatory Visit (HOSPITAL_COMMUNITY): Payer: Self-pay

## 2023-06-06 ENCOUNTER — Telehealth: Payer: Self-pay | Admitting: Dietician

## 2023-06-06 NOTE — Telephone Encounter (Signed)
Returned patient call. She is not available.   Appointment needed for Omnipod 5 training. Left message for her to return my call for a training appointment.  Oran Rein, RD, LDN, CDCES

## 2023-06-06 NOTE — Telephone Encounter (Signed)
Dxecom needs PA

## 2023-06-10 ENCOUNTER — Telehealth: Payer: Self-pay

## 2023-06-10 ENCOUNTER — Other Ambulatory Visit (HOSPITAL_COMMUNITY): Payer: Self-pay

## 2023-06-10 NOTE — Telephone Encounter (Signed)
Pharmacy Patient Advocate Encounter   Received notification from Pt Calls Messages that prior authorization for Omnipod 5 DexG7G6 pods is required/requested.   Insurance verification completed.   The patient is insured through [facility] .   Per test claim: PA required; PA submitted to above mentioned insurance via CoverMyMeds Key/confirmation #/EOC [id number] Status is pending

## 2023-06-10 NOTE — Telephone Encounter (Signed)
Pharmacy Patient Advocate Encounter   Received notification from Pt Calls Messages that prior authorization for Dexcom G7 sensor is required/requested.   Insurance verification completed.   The patient is insured through [facility] .   Per test claim: PA required; PA submitted to above mentioned insurance via CoverMyMeds Key/confirmation #/EOC [id number] Status is pending

## 2023-06-12 NOTE — Telephone Encounter (Signed)
Pharmacy Patient Advocate Encounter  Received notification from [facility] that Prior Authorization for Dexcom G7 sensor has been APPROVED through 06/09/2024   PA #/Case ID/Reference #: [id number]

## 2023-06-12 NOTE — Telephone Encounter (Signed)
Pharmacy Patient Advocate Encounter  Received notification from [facility] that Prior Authorization for Omnipods has been APPROVED through 06/09/2024   PA #/Case ID/Reference #: [id number]

## 2023-06-13 ENCOUNTER — Telehealth: Payer: Self-pay | Admitting: Dietician

## 2023-06-13 NOTE — Telephone Encounter (Signed)
Called patient and informed her that her pharmacy will order the intro kit for Omnipod 5. Requested a vial of Novolog for use in pump.  Instructed patient to bring the vial, pods, and pump to the training appointment.  Instructed her to make her Omnipod account. Scheduled appointment for training 07/03/2023 with LInda.  Oran Rein, RD, LDN, CDCES

## 2023-06-13 NOTE — Telephone Encounter (Signed)
Patient called and stated that she needs an appointment for training on the Omnipod 5. She does not have the pump or PODs yet.  Discussed that she needs to get have these before the appointment is made.  Also, discussed that the POD's should say G6/G7 on the box.  She is to call again when she gets these products.  Oran Rein, RD, LDN, CDCES

## 2023-06-14 ENCOUNTER — Other Ambulatory Visit: Payer: Self-pay

## 2023-06-14 MED ORDER — INSULIN ASPART 100 UNIT/ML IJ SOLN: INTRAMUSCULAR | 1 refills | Status: DC

## 2023-06-17 ENCOUNTER — Telehealth: Payer: Self-pay | Admitting: Nutrition

## 2023-06-17 DIAGNOSIS — E1065 Type 1 diabetes mellitus with hyperglycemia: Secondary | ICD-10-CM

## 2023-06-17 NOTE — Telephone Encounter (Signed)
LVM of the need to rescedule the appt. For 07/03/23.  Gave number to call me back to reschedule

## 2023-06-19 ENCOUNTER — Other Ambulatory Visit: Payer: Self-pay

## 2023-06-19 DIAGNOSIS — E1065 Type 1 diabetes mellitus with hyperglycemia: Secondary | ICD-10-CM

## 2023-06-19 MED ORDER — OMNIPOD 5 G7 INTRO (GEN 5) KIT: 1.0000 | PACK | 0 refills | Status: DC

## 2023-06-19 MED ORDER — OMNIPOD 5 G7 PODS (GEN 5) MISC: 1.0000 | 3 refills | Status: DC

## 2023-06-19 NOTE — Telephone Encounter (Signed)
Done

## 2023-06-19 NOTE — Telephone Encounter (Signed)
Patient called back saying she does not have the starter kit for the OmniPod.  Pharmacist has been telling her that it is on back order. I called the Restaurant manager, fast food, he says the kits containing the PDM for the G7 sensors are not in the pharmacy.  They must be ordered Through [employer]. However, they must be ordered together with the G7 pods.  But since she has the pods, you are needing to write for the OmniPOd 5 G7 intro kit, and another script for the G7 pods.  But in the comment section, say controller only.

## 2023-06-21 ENCOUNTER — Other Ambulatory Visit: Payer: Self-pay

## 2023-07-03 ENCOUNTER — Ambulatory Visit: Payer: Medicaid Other | Admitting: Nutrition

## 2023-07-29 ENCOUNTER — Encounter: Payer: Medicaid Other | Attending: Internal Medicine | Admitting: Nutrition

## 2023-07-29 DIAGNOSIS — E1065 Type 1 diabetes mellitus with hyperglycemia: Secondary | ICD-10-CM | POA: Insufficient documentation

## 2023-07-29 NOTE — Progress Notes (Signed)
Patient was trained on the use of the OmniPod 5 insulin pump.  Settings were put in per Dr. Harvel Ricks orders:  Basal rate: 0.8 u/hr. ISF: 46, I/C: 1 with written instructions to give 4u for meals and 2u for snacks, target 120 with correction over 160.  Bolus max: 15, basal max 1.6u/hr., Timing 4 hours.  She was trained on how to give a bolus, how to fill a pod and how/when to do correction doses.  She filled a pod with Novolog insulin and attached this to her left arm.  We discussed the appropriate site placement of pods with reference to her sensor session.  The OmniPod was linked to glooko and to [facility].  The dexcom G7 was linked to the OmniPod and to [facility].   She redemonstrated how to bolus and do correction doses, and we reviewed alerts/alarms and high blood sugar protocols. She was given a pump packet with the above information, as well as supplies to carry, sick day guidelines and high/low blood sugars reasons, and what to do for them.  We reviewed low blood sugar protocols.  She treats low with 1/2 cup apple juice.  She had a low this morning while in training,:61mg  with 2.5u of IOB.  This was treated with 2 containers of apple juice.  Her blood sugar was 91 and rising when she left.  She was going to lunch with her 2 children after leaving here.  She had no final questions. OmniPOd log in:  [email or web address], PW: [id number].

## 2023-08-05 ENCOUNTER — Encounter: Payer: Self-pay | Admitting: Podiatry

## 2023-08-05 ENCOUNTER — Ambulatory Visit (INDEPENDENT_AMBULATORY_CARE_PROVIDER_SITE_OTHER): Payer: Medicaid Other | Admitting: Podiatry

## 2023-08-05 DIAGNOSIS — M7751 Other enthesopathy of right foot: Secondary | ICD-10-CM

## 2023-08-05 NOTE — Progress Notes (Signed)
  Subjective:  Patient ID: Traci Ellison, female    DOB: 17-Jun-1986,   MRN: [id number]  Chief Complaint  Patient presents with   Foot Pain    Pt presents for injured  right foot pt states she dropped something on her foot a while ago and she thinks she injured it, pt states it is hard to put weight on the foot and can hardly waalk on it    37 y.o. female presents for concern of right foot injury that occurred on 11/6. Patient relates she dropped a cutting board on her foot. Relates it has been hard to put weight on and can barely walk on her foot.  She is a type 1 diabetic.  Marland Kitchen Denies any other pedal complaints. Denies n/v/f/c.   Past Medical History:  Diagnosis Date   Anxiety    8 months ago   Depression    8 months ago   Diabetes mellitus    type 1--since age 28   Diabetes mellitus type I (HCC)    Headache(784.0)    migraines, last 3 days ago-occurs occ.   History of kidney stones    x 1    Vaginal Pap smear, abnormal     Objective:  Physical Exam: Vascular: DP/PT pulses 2/4 bilateral. CFT <3 seconds. Normal hair growth on digits. No edema.  Skin. No lacerations or abrasions bilateral feet.  Musculoskeletal: MMT 5/5 bilateral lower extremities in DF, PF, Inversion and Eversion. Deceased ROM in DF of ankle joint. Pain out of proportion to palpation of the distal forefoot. Some tenderness around the hindfoot but most pain around midfoot.  Neurological: Sensation intact to light touch.   Assessment:   1. Tendinitis of right ankle      Plan:  Patient was evaluated and treated and all questions answered. -Xrays reviewed. No acute fractures or dislocations noted.  -Discussed treatement options for contusion of the foot and possible tendonitis vs neuritis ; risks, alternatives, and benefits explained. - Continue surgical shoe. Patient to wear at all times and instructed on use -Recommend protection, rest, ice, elevation daily until symptoms improve -Rx pain  med/antinflammatories as needed -Discussed potential for CRPS given length of pain and pain out of proportion.  Patient denies drug abuse or dependence.  -Patient to return to office in 4 weeks for serial x-rays to assess healing  or sooner if condition worsens.   Louann Sjogren, DPM

## 2023-08-06 ENCOUNTER — Telehealth: Payer: Self-pay | Admitting: Nutrition

## 2023-08-06 NOTE — Patient Instructions (Signed)
Call OmniPod helpline if questions about your insulin pump Read over manual on line.  Read over starter booklet given Read over pump packet handout given and review high blood sugar protocol Call if blood sugars drop low or remain high

## 2023-08-06 NOTE — Telephone Encounter (Signed)
Patient reports no difficulty in using pump.  Glooko showing 84% time in range, but 6% lows.  Pt. Says low is result of not eating enough.   Says has a cold and has lost her appetite.  Appointment schedule for a return visit at patient's request

## 2023-08-13 ENCOUNTER — Ambulatory Visit: Payer: Medicaid Other | Admitting: Nutrition

## 2023-09-02 ENCOUNTER — Encounter: Payer: Self-pay | Admitting: Podiatry

## 2023-09-02 ENCOUNTER — Ambulatory Visit (INDEPENDENT_AMBULATORY_CARE_PROVIDER_SITE_OTHER): Payer: No Typology Code available for payment source | Admitting: Podiatry

## 2023-09-02 DIAGNOSIS — M7751 Other enthesopathy of right foot: Secondary | ICD-10-CM

## 2023-09-02 MED ORDER — GABAPENTIN 300 MG PO CAPS: 300.0000 mg | ORAL_CAPSULE | Freq: Every day | ORAL | 3 refills | Status: AC

## 2023-09-02 NOTE — Progress Notes (Signed)
  Subjective:  Patient ID: [name], female    DOB: [birth year],   MRN: [id number]  No chief complaint on file.   38 y.o. female presents for follow-up of right foot injury that occurred on 11/6 when she dropped a cutting board on her foot. Does relates she was getting some improvement. Relates since her last visit she has had another fall and believes she re injured the foot as the pain has worsened since.   Relates it has been hard to put weight on and can barely walk on her foot.  She relates tingling up her leg and pain tracking proximally.  She is a type 1 diabetic.  Marland Kitchen Denies any other pedal complaints. Denies n/v/f/c.   Past Medical History:  Diagnosis Date   Anxiety    8 months ago   Depression    8 months ago   Diabetes mellitus    type 1--since age 39   Diabetes mellitus type I (HCC)    Headache(784.0)    migraines, last 3 days ago-occurs occ.   History of kidney stones    x 1    Vaginal Pap smear, abnormal     Objective:  Physical Exam: Vascular: DP/PT pulses 2/4 bilateral. CFT <3 seconds. Normal hair growth on digits. No edema.  Skin. No lacerations or abrasions bilateral feet.  Musculoskeletal: MMT 5/5 bilateral lower extremities in DF, PF, Inversion and Eversion. Deceased ROM in DF of ankle joint. Pain out of proportion to palpation of the distal forefoot. Some tenderness around the hindfoot . Some pain tracking up ankle. No pain to palpation today to midfoot or ankle. Relates tingling in this area.  Neurological: Sensation intact to light touch.   Assessment:   1. Tendinitis of right ankle       Plan:  Patient was evaluated and treated and all questions answered. -Xrays reviewed. No acute fractures or dislocations noted. No major changes from prior.  -Discussed treatement options for contusion of the foot and possible tendonitis vs neuritis ; risks, alternatives, and benefits explained. -Continue surgical shoe. Patient to wear at all times and instructed  on use -Recommend protection, rest, ice, elevation daily until symptoms improve -Rx pain med/antinflammatories as needed. -Gabapentin 300 mg nightly for nerve irritation  -Amb ref to PT to aid with gait and pain.  -Discussed potential for CRPS given length of pain and pain out of proportion.  Patient denies drug abuse or dependence.  -Patient to return to office in 8 weeks for serial x-rays to assess healing  or sooner if condition worsens.   Louann Sjogren, DPM

## 2023-09-10 ENCOUNTER — Ambulatory Visit: Payer: Medicaid Other | Admitting: Internal Medicine

## 2023-09-12 ENCOUNTER — Other Ambulatory Visit: Payer: Self-pay

## 2023-09-12 ENCOUNTER — Ambulatory Visit: Payer: Medicaid Other | Attending: Podiatry

## 2023-09-12 DIAGNOSIS — M7751 Other enthesopathy of right foot: Secondary | ICD-10-CM | POA: Diagnosis not present

## 2023-09-12 DIAGNOSIS — M25571 Pain in right ankle and joints of right foot: Secondary | ICD-10-CM | POA: Diagnosis present

## 2023-09-12 DIAGNOSIS — M25674 Stiffness of right foot, not elsewhere classified: Secondary | ICD-10-CM | POA: Diagnosis present

## 2023-09-12 NOTE — Therapy (Signed)
 OUTPATIENT PHYSICAL THERAPY LOWER EXTREMITY EVALUATION   Patient Name: Traci Ellison MRN: [id number] DOB:1985-10-02, 38 y.o., female Today's Date: 09/12/2023  END OF SESSION:  PT End of Session - 09/12/23 1521     Visit Number 1    Number of Visits 12    Date for PT Re-Evaluation 11/29/23    PT Start Time 1520    PT Stop Time 1558    PT Time Calculation (min) 38 min    Activity Tolerance Patient limited by pain    Behavior During Therapy WFL for tasks assessed/performed             Past Medical History:  Diagnosis Date   Anxiety    8 months ago   Depression    8 months ago   Diabetes mellitus    type 1--since age 41   Diabetes mellitus type I (HCC)    Headache(784.0)    migraines, last 3 days ago-occurs occ.   History of kidney stones    x 1    Vaginal Pap smear, abnormal    Past Surgical History:  Procedure Laterality Date   CERVICAL BIOPSY  W/ LOOP ELECTRODE EXCISION     childbirth      x2 -last 3 months ago-[facility]   CYST REMOVAL TRUNK Left 04/03/2013   Procedure: EXCISION CHRONIC SEBACEOUS CYST LEFT UPPER CHEST WALL;  Surgeon: Vicenta DELENA Poli, MD;  Location: [facility];  Service: General;  Laterality: Left;   INDUCED ABORTION     Patient Active Problem List   Diagnosis Date Noted   Type 1 diabetes mellitus with hyperglycemia (HCC) 05/30/2023   Poorly controlled type 1 diabetes mellitus (HCC) 07/06/2021   Essential hypertension, benign 07/06/2021   Mixed hyperlipidemia 07/06/2021   Personal history of noncompliance with medical treatment, presenting hazards to health 03/20/2017   Current smoker 09/28/2016   Uncontrolled type 1 diabetes mellitus with complication 09/21/2016   Pre-existing type 1 diabetes mellitus in pregnancy in first trimester 05/13/2012   Sebaceous cyst 04/28/2012   REFERRING PROVIDER: Joya Stabs, DPM   REFERRING DIAG: Tendinitis of right ankle   THERAPY DIAG:  Pain in right ankle and joints of right  foot  Stiffness of right foot, not elsewhere classified  Rationale for Evaluation and Treatment: Rehabilitation  ONSET DATE: 06/12/23  SUBJECTIVE:   SUBJECTIVE STATEMENT: Patient reports that she injured her right foot initially at work on 06/12/2023 when she dropped a wooden cutting board on her right foot. She then re aggravated this injury on 08/28/23 when she lost her balance and had to catch herself with her right foot. She feels that her pain has been getting worse since then. She had not had any problems with her foot prior to these injuries. Her pain will get so bad that she will go home crying as she is having to stand for 1-2 hours at a time when she was told by her referring physician to only be on her feet for 5 minutes at a time. She is wearing a surgical shoe on her right foot at all times except when she goes to bed at night.   PERTINENT HISTORY: Hypertension, diabetes type 1, current smoker, anxiety, and depression PAIN:  Are you having pain? Yes: NPRS scale: Current: 6-7/10 Best: 6-7/10 Worst: 10/10 Pain location: Right foot and ankle up to the knee Pain description: constant sharp, shooting, numbness, and tingling Aggravating factors: standing, walking, and weightbearing on her right foot Relieving factors: nothing  PRECAUTIONS: Fall  RED FLAGS: None   WEIGHT BEARING RESTRICTIONS: No  FALLS:  Has patient fallen in last 6 months? Yes. Number of falls 3; she has fallen multiple times since her initial injury because her right leg will give out with the most recent being on 09/04/23.   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  COGNITION: Overall cognitive status: Within functional limits for tasks assessed     SENSATION: Light touch: Impaired with diminished sensation in right lower leg and plantar surface of the right foot with no dermatomal pattern Patient reports numbness and tingling in her right foot and ankle.  EDEMA:  Figure 8: L: 51.6 cm R: 52.6 cm   PALPATION: TTP: right anterior tibialis, medial gastrocnemius/soleus, fibularis longus, medial malleolus, 4th metatarsal, plantar fascia, and achilles tendon  LOWER EXTREMITY ROM:  Active ROM Right eval Left eval  Hip flexion    Hip extension    Hip abduction    Hip adduction    Hip internal rotation    Hip external rotation    Knee flexion    Knee extension    Ankle dorsiflexion -12 -1  Ankle plantarflexion 32; familiar pain  45  Ankle inversion -13 23  Ankle eversion 20; familiar pain  18   (Blank rows = not tested)  LOWER EXTREMITY MMT:  MMT Right eval Left eval  Hip flexion    Hip extension    Hip abduction    Hip adduction    Hip internal rotation    Hip external rotation    Knee flexion    Knee extension    Ankle dorsiflexion 3+/5; familiar pain 4+/5  Ankle plantarflexion    Ankle inversion    Ankle eversion     (Blank rows = not tested)  GAIT: Assistive device utilized:  Surgical shoe on the right foot  Level of assistance: Modified independence Comments: step to pattern with decreased stance time on RLE, right foot flat, and absent right foot toe off                                                                                                                                TREATMENT DATE:     PATIENT EDUCATION:  Education details: plan of care, prognosis,  healing, objective findings, and goals for physical therapy Person educated: Patient Education method: Explanation Education comprehension: verbalized understanding  HOME EXERCISE PROGRAM:  ASSESSMENT:  CLINICAL IMPRESSION: Patient is a 38 y.o. female who was seen today for physical therapy evaluation and treatment for right foot and ankle pain secondary to a workplace injury on 06/12/23.  She presented with moderate to high pain severity and irritability with right ankle plantarflexion and eversion being the most aggravating to her familiar symptoms.  Manual muscle testing was attempted, but limited due to her familiar symptoms.  Recommend that she continue with skilled physical therapy to address her impairments to maximize her functional mobility.  OBJECTIVE IMPAIRMENTS: Abnormal gait, decreased activity tolerance, decreased balance, decreased mobility, difficulty walking, decreased ROM, decreased strength, hypomobility, increased edema, impaired sensation, impaired tone, and pain.   ACTIVITY LIMITATIONS: carrying, lifting, standing, squatting, stairs, transfers, and locomotion level  PARTICIPATION LIMITATIONS: meal prep, cleaning, laundry, shopping, community activity, and occupation  PERSONAL FACTORS: Past/current experiences, Time since onset of injury/illness/exacerbation, and 3+ comorbidities: Hypertension, diabetes type 1, current smoker, anxiety, and depression  are also affecting patient's functional outcome.   REHAB POTENTIAL: Fair    CLINICAL DECISION MAKING: Evolving/moderate complexity  EVALUATION COMPLEXITY: Moderate   GOALS: Goals reviewed with patient? Yes  SHORT TERM GOALS: Target date: 10/03/23 Patient will be independent with her initial HEP. Baseline: Goal status: INITIAL  2.  Patient will be able to complete her daily activities without her familiar symptoms exceeding 8/10. Baseline:  Goal status: INITIAL  3.  Patient will improve her right ankle  dorsiflexion to within 5 degrees of neutral for improved gait mechanics. Baseline:  Goal status: INITIAL  4.  Patient will improve her right ankle inversion to at least neutral for improved ankle range of motion. Baseline:  Goal status: INITIAL  LONG TERM GOALS: Target date: 10/24/23  Patient will be independent with her advanced HEP. Baseline:  Goal status: INITIAL  2.  Patient will be able to complete her daily activities without her familiar symptoms exceeding 6/10. Baseline:  Goal status: INITIAL  3.  Patient will report being able to stand for at least 45 minutes without being limited by her familiar symptoms for improved function with her critical job demands. Baseline:  Goal status: INITIAL  4.  Patient will be able to ambulate with minimal to no significant gait deviations for improved functional mobility. Baseline:  Goal status: INITIAL  5.  Patient will be able to lift at least 20 pounds from the floor to her waist with proper lifting mechanics to reduce her risk of reinjury. Baseline:  Goal status: INITIAL  PLAN:  PT FREQUENCY: 2x/week  PT DURATION: 6 weeks  PLANNED INTERVENTIONS: 97164- PT Re-evaluation, 97110-Therapeutic exercises, 97530- Therapeutic activity, 97112- Neuromuscular re-education, 97535- Self Care, [id number]- Manual therapy, [phone]- Gait training, [phone]- Electrical stimulation (unattended), Patient/Family education, Balance training, Stair training, Joint mobilization, Cryotherapy, and Moist heat  PLAN FOR NEXT SESSION: NuStep, ankle circles, manual therapy, lower extremity strengthening, and modalities as needed   Lacinda JAYSON Fass, PT 09/12/2023, 6:40 PM

## 2023-09-24 ENCOUNTER — Encounter: Payer: Self-pay | Admitting: *Deleted

## 2023-09-24 ENCOUNTER — Ambulatory Visit: Payer: Medicaid Other | Admitting: *Deleted

## 2023-09-24 DIAGNOSIS — M25674 Stiffness of right foot, not elsewhere classified: Secondary | ICD-10-CM

## 2023-09-24 DIAGNOSIS — M25571 Pain in right ankle and joints of right foot: Secondary | ICD-10-CM

## 2023-09-24 NOTE — Therapy (Signed)
OUTPATIENT PHYSICAL THERAPY LOWER EXTREMITY EVALUATION   Patient Name: Traci Ellison MRN: [id number] DOB:Nov 26, 1985, 38 y.o., female Today's Date: 09/24/2023  END OF SESSION:  PT End of Session - 09/24/23 1527     Visit Number 2    Number of Visits 12    Date for PT Re-Evaluation 11/29/23    PT Start Time 1515    PT Stop Time 1605    PT Time Calculation (min) 50 min             Past Medical History:  Diagnosis Date   Anxiety    8 months ago   Depression    8 months ago   Diabetes mellitus    type 1--since age 12   Diabetes mellitus type I (HCC)    Headache(784.0)    migraines, last 3 days ago-occurs occ.   History of kidney stones    x 1    Vaginal Pap smear, abnormal    Past Surgical History:  Procedure Laterality Date   CERVICAL BIOPSY  W/ LOOP ELECTRODE EXCISION     childbirth      x2 -last 3 months ago-[facility]   CYST REMOVAL TRUNK Left 04/03/2013   Procedure: EXCISION CHRONIC SEBACEOUS CYST LEFT UPPER CHEST WALL;  Surgeon: Shelly Rubenstein, MD;  Location: [facility];  Service: General;  Laterality: Left;   INDUCED ABORTION     Patient Active Problem List   Diagnosis Date Noted   Type 1 diabetes mellitus with hyperglycemia (HCC) 05/30/2023   Poorly controlled type 1 diabetes mellitus (HCC) 07/06/2021   Essential hypertension, benign 07/06/2021   Mixed hyperlipidemia 07/06/2021   Personal history of noncompliance with medical treatment, presenting hazards to health 03/20/2017   Current smoker 09/28/2016   Uncontrolled type 1 diabetes mellitus with complication 09/21/2016   Pre-existing type 1 diabetes mellitus in pregnancy in first trimester 05/13/2012   Sebaceous cyst 04/28/2012   REFERRING PROVIDER: Louann Sjogren, DPM   REFERRING DIAG: Tendinitis of right ankle   THERAPY DIAG:  Pain in right ankle and joints of right foot  Stiffness of right foot, not elsewhere classified  Rationale for Evaluation and Treatment:  Rehabilitation  ONSET DATE: 06/12/23  SUBJECTIVE:   SUBJECTIVE STATEMENT: Patient reports 9/10 pain today RT ankle/ foot PERTINENT HISTORY: Hypertension, diabetes type 1, current smoker, anxiety, and depression PAIN:  Are you having pain? Yes: NPRS scale: Current: 9/10 Best: 6-7/10 Worst: 10/10 Pain location: Right foot and ankle up to the knee Pain description: constant sharp, shooting, numbness, and tingling Aggravating factors: standing, walking, and weightbearing on her right foot Relieving factors: nothing  PRECAUTIONS: Fall  RED FLAGS: None   WEIGHT BEARING RESTRICTIONS: No  FALLS:  Has patient fallen in last 6 months? Yes. Number of falls 3; she has fallen multiple times since her initial injury because her right leg will give out with the most recent being on 09/04/23.   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  COGNITION: Overall cognitive status: Within functional limits for tasks assessed     SENSATION: Light touch: Impaired with diminished sensation in right lower leg and plantar surface of the right foot with no dermatomal pattern Patient reports numbness and tingling in her right foot and ankle.  EDEMA:  Figure 8: L: 51.6 cm R: 52.6 cm   PALPATION: TTP: right anterior tibialis, medial gastrocnemius/soleus, fibularis longus, medial malleolus, 4th metatarsal, plantar fascia, and achilles  tendon  LOWER EXTREMITY ROM:  Active ROM Right eval Left eval  Hip flexion    Hip extension    Hip abduction    Hip adduction    Hip internal rotation    Hip external rotation    Knee flexion    Knee extension    Ankle dorsiflexion -12 -1  Ankle plantarflexion 32; familiar pain  45  Ankle inversion -13 23  Ankle eversion 20; familiar pain  18   (Blank rows = not tested)  LOWER EXTREMITY MMT:  MMT Right eval Left eval  Hip flexion    Hip extension    Hip abduction    Hip adduction    Hip internal rotation    Hip external rotation    Knee flexion    Knee extension    Ankle dorsiflexion 3+/5; familiar pain 4+/5  Ankle plantarflexion    Ankle inversion    Ankle eversion     (Blank rows = not tested)  GAIT: Assistive device utilized:  Surgical shoe on the right foot  Level of assistance: Modified independence Comments: step to pattern with decreased stance time on RLE, right foot flat, and absent right foot toe off                                                                                                                                TREATMENT DATE:  09/24/23                                    EXERCISE LOG  RT foot  Exercise Repetitions and Resistance Comments  Seated ball mass X 4 mins   Seated Dyna disc X 4 mins   Short foot X 10 hold 5 secs            Blank cell = exercise not performed today  Handout for HEP Manual STW to RT foot Plantar fascia and metatarsal heads Premod x 15 mins 80-150 HZ ti RT foot with HMP  PATIENT EDUCATION:  Education details: plan of care, prognosis, healing, objective findings, and goals for physical therapy Person educated: Patient Education method: Explanation Education comprehension: verbalized understanding  HOME EXERCISE PROGRAM:   ASSESSMENT:  CLINICAL IMPRESSION: Patient  arrived today doing fair 7/10 RT foot pain. Rx focused on therex and STW to RT foot as well as Premod and HMP. Pt did well  and reports  decreased pain end of session..  OBJECTIVE IMPAIRMENTS: Abnormal gait, decreased activity tolerance, decreased balance, decreased mobility, difficulty walking, decreased ROM, decreased strength, hypomobility, increased edema, impaired sensation, impaired tone, and pain.   ACTIVITY LIMITATIONS: carrying, lifting, standing, squatting, stairs, transfers, and locomotion level  PARTICIPATION LIMITATIONS: meal prep, cleaning, laundry, shopping, community activity, and occupation  PERSONAL FACTORS: Past/current experiences, Time since onset of injury/illness/exacerbation, and 3+ comorbidities: Hypertension, diabetes type 1, current smoker, anxiety, and depression  are also affecting patient's functional outcome.   REHAB POTENTIAL: Fair    CLINICAL DECISION MAKING: Evolving/moderate complexity  EVALUATION COMPLEXITY: Moderate   GOALS: Goals reviewed with patient? Yes  SHORT TERM GOALS: Target date: 10/03/23 Patient will be independent with her initial HEP. Baseline: Goal status: INITIAL  2.  Patient will be able to complete her daily activities without her familiar symptoms exceeding 8/10. Baseline:  Goal status: INITIAL  3.  Patient will improve her right ankle dorsiflexion to within 5 degrees of neutral for improved gait mechanics. Baseline:  Goal status: INITIAL  4.  Patient will improve her right ankle inversion to at least neutral for improved ankle range of motion. Baseline:  Goal status: INITIAL  LONG TERM GOALS: Target date: 10/24/23  Patient will be independent with her advanced HEP. Baseline:  Goal status: INITIAL  2.  Patient will be able to complete her daily activities without her familiar symptoms exceeding 6/10. Baseline:  Goal status: INITIAL  3.  Patient will report being able to stand for at least 45 minutes without being limited by her familiar symptoms for improved function with her critical job demands. Baseline:  Goal status: INITIAL  4.  Patient will be  able to ambulate with minimal to no significant gait deviations for improved functional mobility. Baseline:  Goal status: INITIAL  5.  Patient will be able to lift at least 20 pounds from the floor to her waist with proper lifting mechanics to reduce her risk of reinjury. Baseline:  Goal status: INITIAL  PLAN:  PT FREQUENCY: 2x/week  PT DURATION: 6 weeks  PLANNED INTERVENTIONS: 97164- PT Re-evaluation, 97110-Therapeutic exercises, 97530- Therapeutic activity, 97112- Neuromuscular re-education, 97535- Self Care, 13086- Manual therapy, [phone]- Gait training, [phone]- Electrical stimulation (unattended), Patient/Family education, Balance training, Stair training, Joint mobilization, Cryotherapy, and Moist heat  PLAN FOR NEXT SESSION: NuStep, ankle circles, manual therapy, lower extremity strengthening, and modalities as needed   Tolbert Matheson,CHRIS, PTA 09/24/2023, 5:33 PM

## 2023-09-26 ENCOUNTER — Encounter: Payer: Medicaid Other | Admitting: Physical Therapy

## 2023-10-01 ENCOUNTER — Ambulatory Visit: Payer: Medicaid Other | Admitting: *Deleted

## 2023-10-01 DIAGNOSIS — M25674 Stiffness of right foot, not elsewhere classified: Secondary | ICD-10-CM

## 2023-10-01 DIAGNOSIS — M25571 Pain in right ankle and joints of right foot: Secondary | ICD-10-CM

## 2023-10-01 NOTE — Therapy (Signed)
 OUTPATIENT PHYSICAL THERAPY LOWER EXTREMITY TREATMENT   Patient Name: Traci Ellison MRN: [id number] DOB:[birth year], 38 y.o., female Today's Date: 10/01/2023  END OF SESSION:  PT End of Session - 10/01/23 1518     Visit Number 3    Number of Visits 12    Date for PT Re-Evaluation 11/29/23    PT Start Time 1518    PT Stop Time 1604    PT Time Calculation (min) 46 min             Past Medical History:  Diagnosis Date   Anxiety    8 months ago   Depression    8 months ago   Diabetes mellitus    type 1--since age 60   Diabetes mellitus type I (HCC)    Headache(784.0)    migraines, last 3 days ago-occurs occ.   History of kidney stones    x 1    Vaginal Pap smear, abnormal    Past Surgical History:  Procedure Laterality Date   CERVICAL BIOPSY  W/ LOOP ELECTRODE EXCISION     childbirth      x2 -last 3 months ago-[facility]   CYST REMOVAL TRUNK Left 04/03/2013   Procedure: EXCISION CHRONIC SEBACEOUS CYST LEFT UPPER CHEST WALL;  Surgeon: Shelly Rubenstein, MD;  Location: [facility];  Service: General;  Laterality: Left;   INDUCED ABORTION     Patient Active Problem List   Diagnosis Date Noted   Type 1 diabetes mellitus with hyperglycemia (HCC) 05/30/2023   Poorly controlled type 1 diabetes mellitus (HCC) 07/06/2021   Essential hypertension, benign 07/06/2021   Mixed hyperlipidemia 07/06/2021   Personal history of noncompliance with medical treatment, presenting hazards to health 03/20/2017   Current smoker 09/28/2016   Uncontrolled type 1 diabetes mellitus with complication 09/21/2016   Pre-existing type 1 diabetes mellitus in pregnancy in first trimester 05/13/2012   Sebaceous cyst 04/28/2012   REFERRING PROVIDER: Louann Sjogren, DPM   REFERRING DIAG: Tendinitis of right ankle   THERAPY DIAG:  Pain in right ankle and joints of right foot  Stiffness of right foot, not elsewhere classified  Rationale for Evaluation and Treatment:  Rehabilitation  ONSET DATE: 06/12/23  SUBJECTIVE:   SUBJECTIVE STATEMENT: Patient reports 3/10 pain today RT ankle/ foot. Did good after last Rx PERTINENT HISTORY: Hypertension, diabetes type 1, current smoker, anxiety, and depression PAIN:  Are you having pain? Yes: NPRS scale: Current: 9/10 Best: 6-7/10 Worst: 10/10 Pain location: Right foot and ankle up to the knee Pain description: constant sharp, shooting, numbness, and tingling Aggravating factors: standing, walking, and weightbearing on her right foot Relieving factors: nothing  PRECAUTIONS: Fall  RED FLAGS: None   WEIGHT BEARING RESTRICTIONS: No  FALLS:  Has patient fallen in last 6 months? Yes. Number of falls 3; she has fallen multiple times since her initial injury because her right leg will give out with the most recent being on 09/04/23.   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  COGNITION: Overall cognitive status: Within functional limits for tasks assessed     SENSATION: Light touch: Impaired with diminished sensation in right lower leg and plantar surface of the right foot with no dermatomal pattern Patient reports numbness and tingling in her right foot and ankle.  EDEMA:  Figure 8: L: 51.6 cm R: 52.6 cm   PALPATION: TTP: right anterior tibialis, medial gastrocnemius/soleus, fibularis longus, medial malleolus, 4th metatarsal,  plantar fascia, and achilles tendon  LOWER EXTREMITY ROM:  Active ROM Right eval Left eval  Hip flexion    Hip extension    Hip abduction    Hip adduction    Hip internal rotation    Hip external rotation    Knee flexion    Knee extension    Ankle dorsiflexion -12 -1  Ankle plantarflexion 32; familiar pain  45  Ankle inversion -13 23  Ankle eversion 20; familiar pain  18   (Blank rows = not tested)  LOWER EXTREMITY MMT:  MMT Right eval Left eval  Hip flexion    Hip extension    Hip abduction    Hip adduction    Hip internal rotation    Hip external rotation    Knee flexion    Knee extension    Ankle dorsiflexion 3+/5; familiar pain 4+/5  Ankle plantarflexion    Ankle inversion    Ankle eversion     (Blank rows = not tested)  GAIT: Assistive device utilized:  Surgical shoe on the right foot  Level of assistance: Modified independence Comments: step to pattern with decreased stance time on RLE, right foot flat, and absent right foot toe off                                                                                                                                TREATMENT DATE:  10/01/23                                    EXERCISE LOG  RT foot  Exercise Repetitions and Resistance Comments  Seated ball mass X 4 mins   Seated Dyna disc X 5 mins hard side   Short foot X 10 hold 5 secs   Rocker board PF / DF x 3 mins        Blank cell = exercise not performed today    Manual STW to RT foot Plantar fascia and metatarsal heads Premod x 15 mins 80-150 HZ to RT foot with HMP  PATIENT EDUCATION:  Education details: plan of care, prognosis, healing, objective findings, and goals for physical therapy Person educated: Patient Education method: Explanation Education comprehension: verbalized understanding  HOME EXERCISE PROGRAM:   ASSESSMENT:  CLINICAL IMPRESSION: Patient  arrived today doing fair 3/10 RT foot pain. Rx focused on therex  again seated with  added rocker board. STW to RT foot as well as Premod and HMP. Pt did well  and reports decreased pain end of session..  OBJECTIVE IMPAIRMENTS: Abnormal gait, decreased activity tolerance, decreased balance, decreased mobility, difficulty walking, decreased ROM, decreased strength, hypomobility, increased edema, impaired sensation, impaired tone, and pain.   ACTIVITY LIMITATIONS: carrying, lifting, standing, squatting, stairs, transfers, and locomotion level  PARTICIPATION LIMITATIONS: meal prep, cleaning, laundry, shopping, community activity, and occupation  PERSONAL FACTORS: Past/current experiences, Time since onset of injury/illness/exacerbation, and 3+ comorbidities: Hypertension, diabetes type 1, current smoker, anxiety, and depression  are also affecting patient's functional outcome.   REHAB POTENTIAL: Fair    CLINICAL DECISION MAKING: Evolving/moderate complexity  EVALUATION COMPLEXITY: Moderate   GOALS: Goals reviewed with patient? Yes  SHORT TERM GOALS: Target date: 10/03/23 Patient will be independent with her initial HEP. Baseline: Goal status: INITIAL  2.  Patient will be able to complete her daily activities without her familiar symptoms exceeding 8/10. Baseline:  Goal status: INITIAL  3.  Patient will improve her right ankle dorsiflexion to within 5 degrees of neutral for improved gait mechanics. Baseline:  Goal status: INITIAL  4.  Patient will improve her right ankle inversion to at least neutral for improved ankle range of motion. Baseline:  Goal status: INITIAL  LONG TERM GOALS: Target date: 10/24/23  Patient will be independent with her advanced HEP. Baseline:  Goal status: INITIAL  2.  Patient will be able to complete her daily activities without her familiar symptoms exceeding 6/10. Baseline:  Goal status: INITIAL  3.  Patient will report being able to stand for at least 45 minutes without being limited by her familiar symptoms for improved function  with her critical job demands. Baseline:  Goal status: INITIAL  4.  Patient will be able to ambulate with minimal to no significant gait deviations for improved functional mobility. Baseline:  Goal status: INITIAL  5.  Patient will be able to lift at least 20 pounds from the floor to her waist with proper lifting mechanics to reduce her risk of reinjury. Baseline:  Goal status: INITIAL  PLAN:  PT FREQUENCY: 2x/week  PT DURATION: 6 weeks  PLANNED INTERVENTIONS: 97164- PT Re-evaluation, 97110-Therapeutic exercises, 97530- Therapeutic activity, 97112- Neuromuscular re-education, 97535- Self Care, 16109- Manual therapy, [phone]- Gait training, [phone]- Electrical stimulation (unattended), Patient/Family education, Balance training, Stair training, Joint mobilization, Cryotherapy, and Moist heat  PLAN FOR NEXT SESSION: NuStep, ankle circles, manual therapy, lower extremity strengthening, and modalities as needed   Cedra Villalon,CHRIS, PTA 10/01/2023, 5:06 PM

## 2023-10-03 ENCOUNTER — Ambulatory Visit: Payer: Medicaid Other | Admitting: Physical Therapy

## 2023-10-03 DIAGNOSIS — M25674 Stiffness of right foot, not elsewhere classified: Secondary | ICD-10-CM

## 2023-10-03 DIAGNOSIS — M25571 Pain in right ankle and joints of right foot: Secondary | ICD-10-CM

## 2023-10-03 NOTE — Therapy (Signed)
 OUTPATIENT PHYSICAL THERAPY LOWER EXTREMITY TREATMENT   Patient Name: Traci Ellison MRN: [id number] DOB:[birth year], 38 y.o., female Today's Date: 10/03/2023  END OF SESSION:  PT End of Session - 10/03/23 1551     Visit Number 4    Number of Visits 12    Date for PT Re-Evaluation 11/29/23    PT Start Time 0315    PT Stop Time 0407    PT Time Calculation (min) 52 min    Activity Tolerance Patient limited by pain             Past Medical History:  Diagnosis Date   Anxiety    8 months ago   Depression    8 months ago   Diabetes mellitus    type 1--since age 37   Diabetes mellitus type I (HCC)    Headache(784.0)    migraines, last 3 days ago-occurs occ.   History of kidney stones    x 1    Vaginal Pap smear, abnormal    Past Surgical History:  Procedure Laterality Date   CERVICAL BIOPSY  W/ LOOP ELECTRODE EXCISION     childbirth      x2 -last 3 months ago-[facility]   CYST REMOVAL TRUNK Left 04/03/2013   Procedure: EXCISION CHRONIC SEBACEOUS CYST LEFT UPPER CHEST WALL;  Surgeon: Shelly Rubenstein, MD;  Location: [facility];  Service: General;  Laterality: Left;   INDUCED ABORTION     Patient Active Problem List   Diagnosis Date Noted   Type 1 diabetes mellitus with hyperglycemia (HCC) 05/30/2023   Poorly controlled type 1 diabetes mellitus (HCC) 07/06/2021   Essential hypertension, benign 07/06/2021   Mixed hyperlipidemia 07/06/2021   Personal history of noncompliance with medical treatment, presenting hazards to health 03/20/2017   Current smoker 09/28/2016   Uncontrolled type 1 diabetes mellitus with complication 09/21/2016   Pre-existing type 1 diabetes mellitus in pregnancy in first trimester 05/13/2012   Sebaceous cyst 04/28/2012   REFERRING PROVIDER: Louann Sjogren, DPM   REFERRING DIAG: Tendinitis of right ankle   THERAPY DIAG:  Pain in right ankle and joints of right foot  Stiffness of right foot, not elsewhere classified  Rationale for  Evaluation and Treatment: Rehabilitation  ONSET DATE: 06/12/23  SUBJECTIVE:   SUBJECTIVE STATEMENT: Pain lower today.  Trying to walk more without the boot.  PERTINENT HISTORY: Hypertension, diabetes type 1, current smoker, anxiety, and depression PAIN: 10/03/23:  "Low." Are you having pain? Yes: NPRS scale: Current: 9/10 Best: 6-7/10 Worst: 10/10 Pain location: Right foot and ankle up to the knee Pain description: constant sharp, shooting, numbness, and tingling Aggravating factors: standing, walking, and weightbearing on her right foot Relieving factors: nothing  PRECAUTIONS: Fall  RED FLAGS: None   WEIGHT BEARING RESTRICTIONS: No  FALLS:  Has patient fallen in last 6 months? Yes. Number of falls 3; she has fallen multiple times since her initial injury because her right leg will give out with the most recent being on 09/04/23.   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  COGNITION: Overall cognitive status: Within functional limits for tasks assessed     SENSATION: Light touch: Impaired with diminished sensation in right lower leg and plantar surface of the right foot with no dermatomal pattern Patient reports numbness and tingling in her right foot and ankle.  EDEMA:  Figure 8: L: 51.6 cm R: 52.6 cm   PALPATION: TTP: right anterior tibialis, medial gastrocnemius/soleus, fibularis longus, medial  malleolus, 4th metatarsal, plantar fascia, and achilles tendon  LOWER EXTREMITY ROM:  Active ROM Right eval Left eval  Hip flexion    Hip extension    Hip abduction    Hip adduction    Hip internal rotation    Hip external rotation    Knee flexion    Knee extension    Ankle dorsiflexion -12 -1  Ankle plantarflexion 32; familiar pain  45  Ankle inversion -13 23  Ankle eversion 20; familiar pain  18   (Blank rows = not tested)  LOWER EXTREMITY MMT:  MMT Right eval Left eval  Hip flexion    Hip extension    Hip abduction    Hip adduction    Hip internal rotation    Hip external rotation    Knee flexion    Knee extension    Ankle dorsiflexion 3+/5; familiar pain 4+/5  Ankle plantarflexion    Ankle inversion    Ankle eversion     (Blank rows = not tested)  GAIT: Assistive device utilized:  Surgical shoe on the right foot  Level of assistance: Modified independence Comments: step to pattern with decreased stance time on RLE, right foot flat, and absent right foot toe off                                                                                                                                TREATMENT DATE:    10/03/23:  Pulsed combo e'stim/US at 1.50 W/CM2 at 20% on 3.3 mHx x 12 minutes to patient's right affected ankle and foot f/b STW/M x 11 minutes f/b HMP and IFC at 80-150 Hz on 40% scan x 20 minutes.      10/01/23                                    EXERCISE LOG  RT foot  Exercise Repetitions and Resistance Comments  Seated ball mass X 4 mins   Seated Dyna disc X 5 mins hard side   Short foot X 10 hold 5 secs   Rocker board PF / DF x 3 mins        Blank cell = exercise not performed today    Manual STW to RT foot Plantar fascia and metatarsal heads Premod x 15 mins 80-150 HZ to RT foot with HMP  PATIENT EDUCATION:  Education details: plan of care, prognosis, healing, objective findings, and goals for physical therapy Person educated:  Patient Education method: Explanation Education comprehension: verbalized understanding  HOME EXERCISE PROGRAM:   ASSESSMENT:  CLINICAL IMPRESSION: Patient improving.  Walking more without boot.    OBJECTIVE IMPAIRMENTS: Abnormal gait, decreased activity tolerance, decreased balance, decreased mobility, difficulty walking, decreased ROM, decreased strength, hypomobility, increased edema, impaired sensation, impaired tone, and pain.   ACTIVITY LIMITATIONS: carrying, lifting, standing, squatting, stairs, transfers, and locomotion level  PARTICIPATION LIMITATIONS: meal prep, cleaning, laundry, shopping, community activity, and occupation  PERSONAL FACTORS: Past/current experiences, Time since onset of injury/illness/exacerbation, and 3+ comorbidities: Hypertension, diabetes type 1, current smoker, anxiety, and depression  are also affecting patient's functional outcome.   REHAB POTENTIAL: Fair    CLINICAL DECISION MAKING: Evolving/moderate complexity  EVALUATION COMPLEXITY: Moderate   GOALS: Goals reviewed with patient? Yes  SHORT TERM GOALS: Target date: 10/03/23 Patient will be independent with her initial HEP. Baseline: Goal status: INITIAL  2.  Patient will be able to complete her daily activities without her familiar symptoms exceeding 8/10. Baseline:  Goal status: INITIAL  3.  Patient will improve her right ankle dorsiflexion to within 5 degrees of neutral for improved gait mechanics. Baseline:  Goal status: INITIAL  4.  Patient will improve her right ankle inversion to at least neutral for improved ankle range of motion. Baseline:  Goal status: INITIAL  LONG TERM GOALS: Target date: 10/24/23  Patient will be independent with her advanced HEP. Baseline:  Goal status: INITIAL  2.  Patient will be able to complete her daily activities without her familiar symptoms exceeding 6/10. Baseline:  Goal status: INITIAL  3.  Patient will report being able to stand for  at least 45 minutes without being limited by her familiar symptoms for improved function with her critical job demands. Baseline:  Goal status: INITIAL  4.  Patient will be able to ambulate with minimal to no significant gait deviations for improved functional mobility. Baseline:  Goal status: INITIAL  5.  Patient will be able to lift at least 20 pounds from the floor to her waist with proper lifting mechanics to reduce her risk of reinjury. Baseline:  Goal status: INITIAL  PLAN:  PT FREQUENCY: 2x/week  PT DURATION: 6 weeks  PLANNED INTERVENTIONS: 97164- PT Re-evaluation, 97110-Therapeutic exercises, 97530- Therapeutic activity, 97112- Neuromuscular re-education, 97535- Self Care, 16109- Manual therapy, [phone]- Gait training, [phone]- Electrical stimulation (unattended), Patient/Family education, Balance training, Stair training, Joint mobilization, Cryotherapy, and Moist heat  PLAN FOR NEXT SESSION: NuStep, ankle circles, manual therapy, lower extremity strengthening, and modalities as needed   Carlo Guevarra, [place], PT 10/03/2023, 4:09 PM

## 2023-10-04 ENCOUNTER — Ambulatory Visit: Payer: Medicaid Other | Admitting: Internal Medicine

## 2023-10-04 ENCOUNTER — Encounter: Payer: Self-pay | Admitting: Internal Medicine

## 2023-10-04 VITALS — BP 120/80 | HR 87 | Ht 60.75 in | Wt 112.0 lb

## 2023-10-04 DIAGNOSIS — E1065 Type 1 diabetes mellitus with hyperglycemia: Secondary | ICD-10-CM

## 2023-10-04 DIAGNOSIS — E782 Mixed hyperlipidemia: Secondary | ICD-10-CM

## 2023-10-04 LAB — POCT GLYCOSYLATED HEMOGLOBIN (HGB A1C): Hemoglobin A1C: 7.2 % — AB (ref 4.0–5.6)

## 2023-10-04 NOTE — Patient Instructions (Signed)
 Enter 4 g with meals, 2 g with snacks    HOW TO TREAT LOW BLOOD SUGARS (Blood sugar LESS THAN 70 MG/DL) Please follow the RULE OF 15 for the treatment of hypoglycemia treatment (when your (blood sugars are less than 70 mg/dL)   STEP 1: Take 15 grams of carbohydrates when your blood sugar is low, which includes:  3-4 GLUCOSE TABS  OR 3-4 OZ OF JUICE OR REGULAR SODA OR ONE TUBE OF GLUCOSE GEL    STEP 2: RECHECK blood sugar in 15 MINUTES STEP 3: If your blood sugar is still low at the 15 minute recheck --> then, go back to STEP 1 and treat AGAIN with another 15 grams of carbohydrates.

## 2023-10-04 NOTE — Progress Notes (Signed)
 Name: RYLYNN KOBS  MRN/ DOB: [id number], [birth year]   Age/ Sex: 38 y.o., female    PCP: Beatrix Fetters, MD   Reason for Endocrinology Evaluation: Type 1 Diabetes Mellitus     Date of Initial Endocrinology Visit: 05/30/2023    PATIENT IDENTIFIER: Ms. AZRA ABRELL is a 38 y.o. female with a past medical history of DM. The patient presented for initial endocrinology clinic visit on 05/30/2023 for consultative assistance with her diabetes management.    HPI: Ms. Besse was    Diagnosed with DM at age 33 Prior Medications tried/Intolerance: Used to be on the Medtronic pump as a child            Hemoglobin A1c has ranged from 9.8% in 2023, peaking at 11.2% in 2022.  On her initial visit to our clinic she had an A1c of 9.6%, I adjusted MDI regimen and provided her with a correction scale.  I also referred her to our CDE for carb counting and a prescription of Dexcom was sent to the pharmacy    She was started on the OmniPod 07/2023  SUBJECTIVE:   During the last visit (05/30/2023): A1c 9.6%  Today (10/04/23): Patte A Dion is here for follow-up on diabetes management.  She checks blood sugars multiple times daily. The patient has  had hypoglycemic episodes since the last clinic visit. The patient is  symptomatic with these episodes.   Patient denies any nausea or vomiting Patient has occasional constipation but no diarrhea  This patient with type 1 diabetes is treated with OmniPod (insulin pump). During the visit the pump basal and bolus doses were reviewed including carb/insulin rations and supplemental doses. The clinical list was updated. The glucose meter download was reviewed in detail to determine if the current pump settings are providing the best glycemic control without excessive hypoglycemia.  Pump and meter download:    Pump   OmniPod Settings   Insulin type   NovoLog   Basal rate       0000 0.8 u/h               I:C ratio       0000 1:1                   Sensitivity       0000  45      Goal       0000  120             Type & Model of Pump: OmniPod Insulin Type: Currently using NovoLog.  Body mass index is 21.34 kg/m.  PUMP STATISTICS: Average BG: 121 Average Daily Carbs (g): 0  Average Total Daily Insulin: 10.6 Average Daily Basal: 10.2 (96 %) Average Daily Bolus: 0.4 (4 %)      HOME DIABETES REGIMEN: NovoLog  Statin: yes ACE-I/ARB: no   CONTINUOUS GLUCOSE MONITORING RECORD INTERPRETATION    Dates of Recording: 2/15 - 10/04/2023  Sensor description: Dexcom  Results statistics:   CGM use % of time 67.2  Average and SD 121/51  Time in range      69%  % Time Above 180 15  % Time above 250 1  % Time Below target 6   Glycemic patterns summary: BGs are mostly optimal throughout the day and night  Hyperglycemic episodes postprandial  Hypoglycemic episodes occurred through the day and night  Overnight periods: Variable but mostly low    DIABETIC COMPLICATIONS: Microvascular complications:   Denies: DR, retinopathy,  neuropathy  Last eye exam: Completed 07/2022  Macrovascular complications:   Denies: CAD, PVD, CVA   PAST HISTORY: Past Medical History:  Past Medical History:  Diagnosis Date   Anxiety    8 months ago   Depression    8 months ago   Diabetes mellitus    type 1--since age 38   Diabetes mellitus type I (HCC)    Headache(784.0)    migraines, last 3 days ago-occurs occ.   History of kidney stones    x 1    Vaginal Pap smear, abnormal    Past Surgical History:  Past Surgical History:  Procedure Laterality Date   CERVICAL BIOPSY  W/ LOOP ELECTRODE EXCISION     childbirth      x2 -last 3 months ago-[facility]   CYST REMOVAL TRUNK Left 04/03/2013   Procedure: EXCISION CHRONIC SEBACEOUS CYST LEFT UPPER CHEST WALL;  Surgeon: Shelly Rubenstein, MD;  Location: [facility];  Service: General;  Laterality: Left;   INDUCED ABORTION      Social History:  reports that  she has been smoking cigarettes. She has a 0.3 pack-year smoking history. She has never used smokeless tobacco. She reports that she does not drink alcohol and does not use drugs. Family History:  Family History  Problem Relation Age of Onset   Hypertension Mother    Diabetes Mother    Heart disease Mother    Stroke Mother      HOME MEDICATIONS: Allergies as of 10/04/2023       Reactions   Diclofenac Hives, Itching   Nsaids         Medication List        Accurate as of October 04, 2023  1:40 PM. If you have any questions, ask your nurse or doctor.          STOP taking these medications    traZODone 50 MG tablet Commonly known as: DESYREL Stopped by: Johnney Ou Leahanna Buser       TAKE these medications    atorvastatin 10 MG tablet Commonly known as: LIPITOR Take 1 tablet (10 mg total) by mouth at bedtime.   BD Pen Needle Nano U/F 32G X 4 MM Misc Generic drug: Insulin Pen Needle 1 each by Does not apply route 4 (four) times daily.   busPIRone 5 MG tablet Commonly known as: BUSPAR Take 10 mg by mouth 3 (three) times daily.   Dexcom G6 Transmitter Misc 1 Piece by Does not apply route as directed.   Dexcom G7 Sensor Misc 1 Device by Does not apply route as directed.   FLUoxetine 20 MG capsule Commonly known as: PROZAC Take 1 tablet by mouth 2 (two) times daily.   FLUoxetine 10 MG capsule Commonly known as: PROZAC Take 20 mg by mouth daily.   gabapentin 300 MG capsule Commonly known as: NEURONTIN Take 1 capsule (300 mg total) by mouth at bedtime.   glucose blood test strip Commonly known as: ACCU-CHEK ACTIVE STRIPS Use as instructed  At least 7x daily   Lantus SoloStar 100 UNIT/ML Solostar Pen Generic drug: insulin glargine Inject 30 Units into the skin at bedtime.   levonorgestrel 20 MCG/DAY Iud Commonly known as: MIRENA by Intrauterine route.   lisinopril 2.5 MG tablet Commonly known as: ZESTRIL Take 1 tablet by mouth daily.   Microlet  Lancets Misc Use to test blood glucose up to 7 times a day.   NovoLOG FlexPen 100 UNIT/ML FlexPen Generic drug: insulin aspart Inject 6-9 Units  into the skin 3 (three) times daily with meals. Max daily 30 units daily   insulin aspart 100 UNIT/ML injection Commonly known as: NovoLOG Max daily dose 65 units  insulin pump   Omnipod 5 G7 Intro (Gen 5) Kit 1 Device by Does not apply route every other day.   Omnipod 5 G7 Pods (Gen 5) Misc 1 Device by Does not apply route every 3 (three) days.   Restasis 0.05 % ophthalmic emulsion Generic drug: cycloSPORINE 1 drop 2 (two) times daily.   ZANTAC PO Take by mouth as needed.         ALLERGIES: Allergies  Allergen Reactions   Diclofenac Hives and Itching   Nsaids      REVIEW OF SYSTEMS: A comprehensive ROS was conducted with the patient and is negative except as per HPI and    OBJECTIVE:   VITAL SIGNS: BP 120/80 (BP Location: Left Arm, Patient Position: Sitting, Cuff Size: Small)   Pulse 87   Ht 5' 0.75" (1.543 m)   Wt 112 lb (50.8 kg)   SpO2 99%   BMI 21.34 kg/m    PHYSICAL EXAM:  General: Pt appears well and is in NAD  Neck: General: Supple without adenopathy or carotid bruits. Thyroid: Thyroid size normal.  No goiter or nodules appreciated.   Lungs: Clear with good BS bilat   Heart: RRR   Abdomen:  soft, nontender  Extremities:  Lower extremities - No pretibial edema.   Neuro: MS is good with appropriate affect, pt is alert and Ox3    DM foot exam: 05/30/2023  The skin of the feet is intact without sores or ulcerations. The pedal pulses are 2+ on right and 2+ on left. The sensation is intact to a screening 5.07, 10 gram monofilament bilaterally   DATA REVIEWED:  Lab Results  Component Value Date   HGBA1C 7.2 (A) 10/04/2023   HGBA1C 9.6 (A) 05/30/2023   HGBA1C 9.8 (A) 11/09/2021    Latest Reference Range & Units 05/30/23 09:09  Sodium 135 - 145 mEq/L 140  Potassium 3.5 - 5.1 mEq/L 3.8  Chloride 96  - 112 mEq/L 105  CO2 19 - 32 mEq/L 30  Glucose 70 - 99 mg/dL 67 (L)  BUN 6 - 23 mg/dL 12  Creatinine 1.61 - 0.96 mg/dL 0.45  Calcium 8.4 - 40.9 mg/dL 9.8  GFR >81.19 mL/min 106.57  Total CHOL/HDL Ratio  3  Cholesterol 0 - 200 mg/dL 147  HDL Cholesterol >82.95 mg/dL 62.13  LDL (calc) 0 - 99 mg/dL 94  MICROALB/CREAT RATIO 0.0 - 30.0 mg/g 1.7  NonHDL  104.13  Triglycerides 0.0 - 149.0 mg/dL 08.6  VLDL 0.0 - 57.8 mg/dL 46.9    Latest Reference Range & Units 05/30/23 09:09  TSH 0.35 - 5.50 uIU/mL 2.29  T4,Free(Direct) 0.60 - 1.60 ng/dL 6.29     Latest Reference Range & Units 05/30/23 09:09  Creatinine,U mg/dL 52.8  Microalb, Ur 0.0 - 1.9 mg/dL 1.0  MICROALB/CREAT RATIO 0.0 - 30.0 mg/g 1.7    ASSESSMENT / PLAN / RECOMMENDATIONS:   1) Type 1 Diabetes Mellitus, Optimally  controlled, Without  complications - Most recent A1c of 7.2 %. Goal A1c < 7.0 %.     -A1c has trended down from 9.6% to 7.2% -Patient has been noted with hypoglycemia, will reduce basal rate -Patient has not been entering any boluses, I did explain to the patient the importance of entering carbohydrates with meals/snacks to prevent hyperglycemia -BMP showed hyperglycemia, GFR is normal,  with normal TFTs and MA/CR ratio in the past   MEDICATIONS: NovoLog     Pump   OmniPod Settings   Insulin type   NovoLog   Basal rate       0000 0.65 u/h           I:C ratio       0000 1:1    Enter #4 g with meals/2g with snack          Sensitivity       0000  45      Goal       0000  120         EDUCATION / INSTRUCTIONS: BG monitoring instructions: Patient is instructed to check her blood sugars 3 times a day. Call [facility] if: BG persistently < 70  I reviewed the Rule of 15 for the treatment of hypoglycemia in detail with the patient. Literature supplied.   2) Diabetic complications:  Eye: Does not have known diabetic retinopathy.  Neuro/ Feet: Does not have known diabetic  peripheral neuropathy. Renal: Patient does not have known baseline CKD. She is not on an ACEI/ARB at present.  3) Dyslipidemia :  -Lipid panel acceptable  Medication Continue atorvastatin 10 mg daily     Follow-up in 4 months  Signed electronically by: Lyndle Herrlich, MD  [facility] Endocrinology  [facility] [address] [place], [place] [address] Phone: [phone] FAX: [phone]   CC: Beatrix Fetters, MD [address] [place] [address] Phone: [phone]  Fax: [phone]    Return to Endocrinology clinic as below: Future Appointments  Date Time Provider Department Center  10/08/2023  3:15 PM Granville Lewis, [place] [facility]  10/10/2023  3:15 PM Newman Pies, PTA [facility]  11/04/2023  1:45 PM Louann Sjogren, DPM [facility]

## 2023-10-10 ENCOUNTER — Ambulatory Visit: Payer: Medicaid Other | Attending: Podiatry

## 2023-10-10 DIAGNOSIS — M25571 Pain in right ankle and joints of right foot: Secondary | ICD-10-CM | POA: Insufficient documentation

## 2023-10-10 DIAGNOSIS — M25674 Stiffness of right foot, not elsewhere classified: Secondary | ICD-10-CM | POA: Insufficient documentation

## 2023-10-10 NOTE — Therapy (Signed)
 OUTPATIENT PHYSICAL THERAPY LOWER EXTREMITY TREATMENT   Patient Name: Traci Ellison MRN: [id number] DOB:14-May-1986, 38 y.o., female Today's Date: 10/10/2023  END OF SESSION:  PT End of Session - 10/10/23 1516     Visit Number 5    Number of Visits 8   8 approved by insurance   Date for PT Re-Evaluation 11/29/23    PT Start Time 1515    PT Stop Time 1620    PT Time Calculation (min) 65 min    Activity Tolerance Patient limited by pain             Past Medical History:  Diagnosis Date   Anxiety    8 months ago   Depression    8 months ago   Diabetes mellitus    type 1--since age 33   Diabetes mellitus type I (HCC)    Headache(784.0)    migraines, last 3 days ago-occurs occ.   History of kidney stones    x 1    Vaginal Pap smear, abnormal    Past Surgical History:  Procedure Laterality Date   CERVICAL BIOPSY  W/ LOOP ELECTRODE EXCISION     childbirth      x2 -last 3 months ago-[facility]   CYST REMOVAL TRUNK Left 04/03/2013   Procedure: EXCISION CHRONIC SEBACEOUS CYST LEFT UPPER CHEST WALL;  Surgeon: Shelly Rubenstein, MD;  Location: [facility];  Service: General;  Laterality: Left;   INDUCED ABORTION     Patient Active Problem List   Diagnosis Date Noted   Type 1 diabetes mellitus with hyperglycemia (HCC) 05/30/2023   Poorly controlled type 1 diabetes mellitus (HCC) 07/06/2021   Essential hypertension, benign 07/06/2021   Mixed hyperlipidemia 07/06/2021   Personal history of noncompliance with medical treatment, presenting hazards to health 03/20/2017   Current smoker 09/28/2016   Uncontrolled type 1 diabetes mellitus with complication 09/21/2016   Pre-existing type 1 diabetes mellitus in pregnancy in first trimester 05/13/2012   Sebaceous cyst 04/28/2012   REFERRING PROVIDER: Louann Sjogren, DPM   REFERRING DIAG: Tendinitis of right ankle   THERAPY DIAG:  Pain in right ankle and joints of right foot  Stiffness of right foot, not elsewhere  classified  Rationale for Evaluation and Treatment: Rehabilitation  ONSET DATE: 06/12/23  SUBJECTIVE:   SUBJECTIVE STATEMENT: Pt reports 5/10 right foot and ankle pain today.   PERTINENT HISTORY: Hypertension, diabetes type 1, current smoker, anxiety, and depression  PAIN: 10/03/23:  "Low." Are you having pain? Yes: NPRS scale: 5/10 Pain location: Right foot and ankle up to the knee Pain description: constant sharp, shooting, numbness, and tingling Aggravating factors: standing, walking, and weightbearing on her right foot Relieving factors: nothing  PRECAUTIONS: Fall  RED FLAGS: None   WEIGHT BEARING RESTRICTIONS: No  FALLS:  Has patient fallen in last 6 months? Yes. Number of falls 3; she has fallen multiple times since her initial injury because her right leg will give out with the most recent being on 09/04/23.   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  COGNITION: Overall cognitive status: Within functional limits for tasks assessed     SENSATION: Light touch: Impaired with diminished sensation in right lower leg and plantar surface of the right foot with no dermatomal pattern Patient reports numbness and tingling in her right foot and ankle.  EDEMA:  Figure 8: L: 51.6 cm R: 52.6 cm   PALPATION: TTP: right anterior tibialis, medial gastrocnemius/soleus, fibularis longus, medial malleolus,  4th metatarsal, plantar fascia, and achilles tendon  LOWER EXTREMITY ROM:  Active ROM Right eval Left eval  Hip flexion    Hip extension    Hip abduction    Hip adduction    Hip internal rotation    Hip external rotation    Knee flexion    Knee extension    Ankle dorsiflexion -12 -1  Ankle plantarflexion 32; familiar pain  45  Ankle inversion -13 23  Ankle eversion 20; familiar pain  18   (Blank rows = not tested)  LOWER EXTREMITY MMT:  MMT Right eval Left eval  Hip flexion    Hip extension    Hip abduction    Hip adduction    Hip internal rotation    Hip external rotation    Knee flexion    Knee extension    Ankle dorsiflexion 3+/5; familiar pain 4+/5  Ankle plantarflexion    Ankle inversion    Ankle eversion     (Blank rows = not tested)  GAIT: Assistive device utilized:  Surgical shoe on the right foot  Level of assistance: Modified independence Comments: step to pattern with decreased stance time on RLE, right foot flat, and absent right foot toe off                                                                                                                               TREATMENT DATE:    10/10/23  EXERCISE LOG  RT foot  Exercise Repetitions and Resistance Comments  Seated ball mass    Seated Dyna disc X 5 mins Massaging side   Lunges  8" box x 3 mins   Rocker board PF / DF x 4 mins   Nustep  Lvl 3 x 10 mins    Blank cell = exercise not performed today   Manual STW to RT foot Plantar fascia and metatarsal heads Premod x 15 mins 80-150 HZ to RT foot with HMP  10/03/23:  Pulsed combo e'stim/US at 1.50 W/CM2 at 20% on 3.3 mHx x 12 minutes to patient's right affected ankle and foot f/b STW/M x 11 minutes f/b HMP and IFC at 80-150 Hz on 40% scan x 20 minutes.    10/01/23  EXERCISE LOG  RT foot  Exercise Repetitions and Resistance Comments  Seated ball mass X 4 mins   Seated Dyna disc X 5 mins hard side   Short foot  X 10 hold 5 secs   Rocker board PF / DF x 3 mins        Blank cell = exercise not performed today    Manual STW to RT foot Plantar fascia and metatarsal heads Premod x 15 mins 80-150 HZ to RT foot with HMP  PATIENT EDUCATION:  Education details: plan of care, prognosis, healing, objective findings, and goals for physical therapy Person educated: Patient Education method: Explanation Education comprehension: verbalized understanding  HOME EXERCISE PROGRAM:   ASSESSMENT:  CLINICAL IMPRESSION: Pt arrives for today's treatment session reporting 5/10 right foot pain.  Pt reports being on her feet "a lot" today. Pt able to tolerate increased time with various previously performed exercises.  Pt introduced to standing lunges to increase ROM and function with good results.  STW/M performed to right plantar surface and metatarsals to decrease pain and tone.  Normal responses to estim and Mh noted upon removal.      OBJECTIVE IMPAIRMENTS: Abnormal gait, decreased activity tolerance, decreased balance, decreased mobility, difficulty walking, decreased ROM, decreased strength, hypomobility, increased edema, impaired sensation, impaired tone, and pain.   ACTIVITY LIMITATIONS: carrying, lifting, standing, squatting, stairs, transfers, and locomotion level  PARTICIPATION LIMITATIONS: meal prep, cleaning, laundry, shopping, community activity, and occupation  PERSONAL FACTORS: Past/current experiences, Time since onset of injury/illness/exacerbation, and 3+ comorbidities: Hypertension, diabetes type 1, current smoker, anxiety, and depression  are also affecting patient's functional outcome.   REHAB POTENTIAL: Fair    CLINICAL DECISION MAKING: Evolving/moderate complexity  EVALUATION COMPLEXITY: Moderate   GOALS: Goals reviewed with patient? Yes  SHORT TERM GOALS: Target date: 10/03/23 Patient will be independent with her initial HEP. Baseline: Goal status: INITIAL  2.  Patient will be  able to complete her daily activities without her familiar symptoms exceeding 8/10. Baseline:  Goal status: INITIAL  3.  Patient will improve her right ankle dorsiflexion to within 5 degrees of neutral for improved gait mechanics. Baseline:  Goal status: INITIAL  4.  Patient will improve her right ankle inversion to at least neutral for improved ankle range of motion. Baseline:  Goal status: INITIAL  LONG TERM GOALS: Target date: 10/24/23  Patient will be independent with her advanced HEP. Baseline:  Goal status: INITIAL  2.  Patient will be able to complete her daily activities without her familiar symptoms exceeding 6/10. Baseline:  Goal status: INITIAL  3.  Patient will report being able to stand for at least 45 minutes without being limited by her familiar symptoms for improved function with her critical job demands. Baseline:  Goal status: INITIAL  4.  Patient will be able to ambulate with minimal to no significant gait deviations for improved functional mobility. Baseline:  Goal status: INITIAL  5.  Patient will be able to lift at least 20 pounds from the floor to her waist with proper lifting mechanics to reduce her risk of reinjury. Baseline:  Goal status: INITIAL  PLAN:  PT FREQUENCY: 2x/week  PT DURATION: 6 weeks  PLANNED INTERVENTIONS: 97164- PT Re-evaluation, 97110-Therapeutic exercises, 97530- Therapeutic activity, 97112- Neuromuscular re-education, 97535- Self Care, 19147- Manual therapy, [phone]- Gait training, [phone]- Electrical stimulation (unattended), Patient/Family education, Balance training, Stair training, Joint mobilization, Cryotherapy, and Moist heat  PLAN FOR NEXT SESSION: NuStep, ankle circles, manual therapy, lower extremity strengthening, and modalities  as needed   Newman Pies, PTA 10/10/2023, 4:34 PM

## 2023-10-15 ENCOUNTER — Ambulatory Visit

## 2023-10-15 DIAGNOSIS — M25571 Pain in right ankle and joints of right foot: Secondary | ICD-10-CM

## 2023-10-15 DIAGNOSIS — M25674 Stiffness of right foot, not elsewhere classified: Secondary | ICD-10-CM

## 2023-10-15 NOTE — Therapy (Signed)
 OUTPATIENT PHYSICAL THERAPY LOWER EXTREMITY TREATMENT   Patient Name: Traci Ellison MRN: [id number] DOB:07-Oct-1985, 38 y.o., female Today's Date: 10/15/2023  END OF SESSION:  PT End of Session - 10/15/23 1519     Visit Number 6    Number of Visits 8   8 approved by insurance   Date for PT Re-Evaluation 11/29/23    PT Start Time 1518    PT Stop Time 1620    PT Time Calculation (min) 62 min    Activity Tolerance Patient limited by pain             Past Medical History:  Diagnosis Date   Anxiety    8 months ago   Depression    8 months ago   Diabetes mellitus    type 1--since age 81   Diabetes mellitus type I (HCC)    Headache(784.0)    migraines, last 3 days ago-occurs occ.   History of kidney stones    x 1    Vaginal Pap smear, abnormal    Past Surgical History:  Procedure Laterality Date   CERVICAL BIOPSY  W/ LOOP ELECTRODE EXCISION     childbirth      x2 -last 3 months ago-[facility]   CYST REMOVAL TRUNK Left 04/03/2013   Procedure: EXCISION CHRONIC SEBACEOUS CYST LEFT UPPER CHEST WALL;  Surgeon: Shelly Rubenstein, MD;  Location: [facility];  Service: General;  Laterality: Left;   INDUCED ABORTION     Patient Active Problem List   Diagnosis Date Noted   Type 1 diabetes mellitus with hyperglycemia (HCC) 05/30/2023   Poorly controlled type 1 diabetes mellitus (HCC) 07/06/2021   Essential hypertension, benign 07/06/2021   Mixed hyperlipidemia 07/06/2021   Personal history of noncompliance with medical treatment, presenting hazards to health 03/20/2017   Current smoker 09/28/2016   Uncontrolled type 1 diabetes mellitus with complication 09/21/2016   Pre-existing type 1 diabetes mellitus in pregnancy in first trimester 05/13/2012   Sebaceous cyst 04/28/2012   REFERRING PROVIDER: Louann Sjogren, DPM   REFERRING DIAG: Tendinitis of right ankle   THERAPY DIAG:  Pain in right ankle and joints of right foot  Stiffness of right foot, not elsewhere  classified  Rationale for Evaluation and Treatment: Rehabilitation  ONSET DATE: 06/12/23  SUBJECTIVE:   SUBJECTIVE STATEMENT: Pt reports 10/10 right foot and ankle pain today. Pt reports that he foot felt good until she went back to work the next day after treatment.   PERTINENT HISTORY: Hypertension, diabetes type 1, current smoker, anxiety, and depression  PAIN: 10/03/23:  "Low." Are you having pain? Yes: NPRS scale: 10/10 Pain location: Right foot and ankle up to the knee Pain description: constant sharp, shooting, numbness, and tingling Aggravating factors: standing, walking, and weightbearing on her right foot Relieving factors: nothing  PRECAUTIONS: Fall  RED FLAGS: None   WEIGHT BEARING RESTRICTIONS: No  FALLS:  Has patient fallen in last 6 months? Yes. Number of falls 3; she has fallen multiple times since her initial injury because her right leg will give out with the most recent being on 09/04/23.   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  COGNITION: Overall cognitive status: Within functional limits for tasks assessed     SENSATION: Light touch: Impaired with diminished sensation in right lower leg and plantar surface of the right foot with no dermatomal pattern Patient reports numbness and tingling in her right foot and ankle.  EDEMA:  Figure 8: L: 51.6 cm R: 52.6 cm    PALPATION: TTP: right anterior tibialis, medial gastrocnemius/soleus, fibularis longus, medial malleolus, 4th metatarsal, plantar fascia, and achilles tendon  LOWER EXTREMITY ROM:  Active ROM Right eval Left eval  Hip flexion    Hip extension    Hip abduction    Hip adduction    Hip internal rotation    Hip external rotation    Knee flexion    Knee extension    Ankle dorsiflexion -12 -1  Ankle plantarflexion 32; familiar pain  45  Ankle inversion -13 23  Ankle eversion 20; familiar pain  18   (Blank rows = not tested)  LOWER EXTREMITY MMT:  MMT Right eval Left eval  Hip flexion    Hip extension    Hip abduction    Hip adduction    Hip internal rotation    Hip external rotation    Knee flexion    Knee extension    Ankle dorsiflexion 3+/5; familiar pain 4+/5  Ankle plantarflexion    Ankle inversion    Ankle eversion     (Blank rows = not tested)  GAIT: Assistive device utilized:  Surgical shoe on the right foot  Level of assistance: Modified independence Comments: step to pattern with decreased stance time on RLE, right foot flat, and absent right foot toe off                                                                                                                               TREATMENT DATE:    10/15/23  EXERCISE LOG  RT foot  Exercise Repetitions and Resistance Comments  Towel Stretch 30 sec hold x 5 reps   Slant Board 30 sec x 3 reps   Lunges  8" box x 3 mins   Rocker board PF / DF x 4 mins   Nustep  Lvl 4 x 15 mins    Blank cell = exercise not performed today   Manual STW to RT foot Plantar fascia and metatarsal heads Premod x 15 mins 80-150 HZ to RT foot with HMP  10/03/23:  Pulsed combo e'stim/US at 1.50 W/CM2 at 20% on 3.3 mHx x 12 minutes to patient's right affected ankle and foot f/b STW/M x 11 minutes f/b HMP and IFC at 80-150 Hz on 40% scan x 20 minutes.    10/01/23  EXERCISE LOG  RT foot  Exercise  Repetitions and Resistance Comments  Seated ball mass X 4 mins   Seated Dyna disc X 5 mins hard side   Short foot X 10 hold 5 secs   Rocker board PF / DF x 3 mins        Blank cell = exercise not performed today    Manual STW to RT foot Plantar fascia and metatarsal heads Premod x 15 mins 80-150 HZ to RT foot with HMP  PATIENT EDUCATION:  Education details: plan of care, prognosis, healing, objective findings, and goals for physical therapy Person educated: Patient Education method: Explanation Education comprehension: verbalized understanding  HOME EXERCISE PROGRAM:   ASSESSMENT:  CLINICAL IMPRESSION: Pt arrives for today's treatment session reporting 10/10 right foot pain.   Pt displaying -20 degrees of dorsiflexion today and 27 degrees of inversion, meeting her short term inversion.  Pt denies being able to perform her ADLs without pain going  above 8>10.  STW/M to right plantar surface to decrease pain and tone.  Normal responses to estim and vaso noted upon removal.  Pt reported decreased pain at completion of today's treatment session.    OBJECTIVE IMPAIRMENTS: Abnormal gait, decreased activity tolerance, decreased balance, decreased mobility, difficulty walking, decreased ROM, decreased strength, hypomobility, increased edema, impaired sensation, impaired tone, and pain.   ACTIVITY LIMITATIONS: carrying, lifting, standing, squatting, stairs, transfers, and locomotion level  PARTICIPATION LIMITATIONS: meal prep, cleaning, laundry, shopping, community activity, and occupation  PERSONAL FACTORS: Past/current experiences, Time since onset of injury/illness/exacerbation, and 3+ comorbidities: Hypertension, diabetes type 1, current smoker, anxiety, and depression  are also affecting patient's functional outcome.   REHAB POTENTIAL: Fair    CLINICAL DECISION MAKING: Evolving/moderate complexity  EVALUATION COMPLEXITY: Moderate   GOALS: Goals reviewed with patient?  Yes  SHORT TERM GOALS: Target date: 10/03/23 Patient will be independent with her initial HEP. Baseline: Goal status: MET  2.  Patient will be able to complete her daily activities without her familiar symptoms exceeding 8/10. Baseline:  Goal status: IN PROGRESS  3.  Patient will improve her right ankle dorsiflexion to within 5 degrees of neutral for improved gait mechanics. Baseline: -20 degrees Goal status: IN PROGRESS  4.  Patient will improve her right ankle inversion to at least neutral for improved ankle range of motion. Baseline: 3/11: 27 degrees Goal status: MET  LONG TERM GOALS: Target date: 10/24/23  Patient will be independent with her advanced HEP. Baseline:  Goal status: IN PROGRESS  2.  Patient will be able to complete her daily activities without her familiar symptoms exceeding 6/10. Baseline:  Goal status: IN PROGRESS  3.  Patient will report being able to stand for at least 45 minutes without being limited by her familiar symptoms for improved function with her critical job demands. Baseline:  Goal status: IN PROGRESS  4.  Patient will be able to ambulate with minimal to no significant gait deviations for improved functional mobility. Baseline:  Goal status: IN PROGRESS  5.  Patient will be able to lift at least 20 pounds from the floor to her waist with proper lifting mechanics to reduce her risk of reinjury. Baseline:  Goal status: IN PROGRESS  PLAN:  PT FREQUENCY: 2x/week  PT DURATION: 6 weeks  PLANNED INTERVENTIONS: 97164- PT Re-evaluation, 97110-Therapeutic exercises, 97530- Therapeutic activity, 97112- Neuromuscular re-education, 97535- Self Care, 16109- Manual therapy, [phone]- Gait training, [phone]- Electrical stimulation (unattended), Patient/Family education, Balance training, Stair training, Joint mobilization, Cryotherapy, and Moist heat  PLAN FOR NEXT SESSION: NuStep, ankle circles, manual therapy, lower extremity strengthening, and modalities  as needed   Newman Pies, PTA 10/15/2023, 4:23 PM

## 2023-10-17 ENCOUNTER — Ambulatory Visit

## 2023-10-17 DIAGNOSIS — M25571 Pain in right ankle and joints of right foot: Secondary | ICD-10-CM

## 2023-10-17 DIAGNOSIS — M25674 Stiffness of right foot, not elsewhere classified: Secondary | ICD-10-CM

## 2023-10-17 NOTE — Therapy (Signed)
 OUTPATIENT PHYSICAL THERAPY LOWER EXTREMITY TREATMENT   Patient Name: Traci Ellison MRN: [id number] DOB:1986-06-13, 38 y.o., female Today's Date: 10/17/2023  END OF SESSION:  PT End of Session - 10/17/23 1519     Visit Number 7    Number of Visits 8   8 approved by insurance   Date for PT Re-Evaluation 11/29/23    PT Start Time 1515    PT Stop Time 1616    PT Time Calculation (min) 61 min    Activity Tolerance Patient limited by pain             Past Medical History:  Diagnosis Date   Anxiety    8 months ago   Depression    8 months ago   Diabetes mellitus    type 1--since age 36   Diabetes mellitus type I (HCC)    Headache(784.0)    migraines, last 3 days ago-occurs occ.   History of kidney stones    x 1    Vaginal Pap smear, abnormal    Past Surgical History:  Procedure Laterality Date   CERVICAL BIOPSY  W/ LOOP ELECTRODE EXCISION     childbirth      x2 -last 3 months ago-[facility]   CYST REMOVAL TRUNK Left 04/03/2013   Procedure: EXCISION CHRONIC SEBACEOUS CYST LEFT UPPER CHEST WALL;  Surgeon: Shelly Rubenstein, MD;  Location: [facility];  Service: General;  Laterality: Left;   INDUCED ABORTION     Patient Active Problem List   Diagnosis Date Noted   Type 1 diabetes mellitus with hyperglycemia (HCC) 05/30/2023   Poorly controlled type 1 diabetes mellitus (HCC) 07/06/2021   Essential hypertension, benign 07/06/2021   Mixed hyperlipidemia 07/06/2021   Personal history of noncompliance with medical treatment, presenting hazards to health 03/20/2017   Current smoker 09/28/2016   Uncontrolled type 1 diabetes mellitus with complication 09/21/2016   Pre-existing type 1 diabetes mellitus in pregnancy in first trimester 05/13/2012   Sebaceous cyst 04/28/2012   REFERRING PROVIDER: Louann Sjogren, DPM   REFERRING DIAG: Tendinitis of right ankle   THERAPY DIAG:  Pain in right ankle and joints of right foot  Stiffness of right foot, not elsewhere  classified  Rationale for Evaluation and Treatment: Rehabilitation  ONSET DATE: 06/12/23  SUBJECTIVE:   SUBJECTIVE STATEMENT: Pt reports 10/10 right foot and ankle pain today. Pt reports that he foot felt good until she went back to work the next day after treatment.   PERTINENT HISTORY: Hypertension, diabetes type 1, current smoker, anxiety, and depression  PAIN: 10/03/23:  "Low." Are you having pain? Yes: NPRS scale: 10/10 Pain location: Right foot and ankle up to the knee Pain description: constant sharp, shooting, numbness, and tingling Aggravating factors: standing, walking, and weightbearing on her right foot Relieving factors: nothing  PRECAUTIONS: Fall  RED FLAGS: None   WEIGHT BEARING RESTRICTIONS: No  FALLS:  Has patient fallen in last 6 months? Yes. Number of falls 3; she has fallen multiple times since her initial injury because her right leg will give out with the most recent being on 09/04/23.   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  COGNITION: Overall cognitive status: Within functional limits for tasks assessed     SENSATION: Light touch: Impaired with diminished sensation in right lower leg and plantar surface of the right foot with no dermatomal pattern Patient reports numbness and tingling in her right foot and ankle.  EDEMA:  Figure 8: L: 51.6 cm R: 52.6 cm    PALPATION: TTP: right anterior tibialis, medial gastrocnemius/soleus, fibularis longus, medial malleolus, 4th metatarsal, plantar fascia, and achilles tendon  LOWER EXTREMITY ROM:  Active ROM Right eval Left eval  Hip flexion    Hip extension    Hip abduction    Hip adduction    Hip internal rotation    Hip external rotation    Knee flexion    Knee extension    Ankle dorsiflexion -12 -1  Ankle plantarflexion 32; familiar pain  45  Ankle inversion -13 23  Ankle eversion 20; familiar pain  18   (Blank rows = not tested)  LOWER EXTREMITY MMT:  MMT Right eval Left eval  Hip flexion    Hip extension    Hip abduction    Hip adduction    Hip internal rotation    Hip external rotation    Knee flexion    Knee extension    Ankle dorsiflexion 3+/5; familiar pain 4+/5  Ankle plantarflexion    Ankle inversion    Ankle eversion     (Blank rows = not tested)  GAIT: Assistive device utilized:  Surgical shoe on the right foot  Level of assistance: Modified independence Comments: step to pattern with decreased stance time on RLE, right foot flat, and absent right foot toe off                                                                                                                               TREATMENT DATE:    10/17/23  EXERCISE LOG  RT foot  Exercise Repetitions and Resistance Comments  Towel Stretch 30 sec hold x 5 reps   Slant Board 30 sec x 3 reps   Lunges  8" box x 4 mins   Rocker board PF / DF x 4.5 mins   Nustep  Lvl 4 x 15 mins    Blank cell = exercise not performed today   Manual STW to RT foot Plantar fascia and metatarsal heads Premod x 15 mins 80-150 HZ to RT foot with HMP  10/03/23:  Pulsed combo e'stim/US at 1.50 W/CM2 at 20% on 3.3 mHx x 12 minutes to patient's right affected ankle and foot f/b STW/M x 11 minutes f/b HMP and IFC at 80-150 Hz on 40% scan x 20 minutes.    10/01/23  EXERCISE LOG  RT  foot  Exercise Repetitions and Resistance Comments  Seated ball mass X 4 mins   Seated Dyna disc X 5 mins hard side   Short foot X 10 hold 5 secs   Rocker board PF / DF x 3 mins        Blank cell = exercise not performed today    Manual STW to RT foot Plantar fascia and metatarsal heads Premod x 15 mins 80-150 HZ to RT foot with HMP  PATIENT EDUCATION:  Education details: plan of care, prognosis, healing, objective findings, and goals for physical therapy Person educated: Patient Education method: Explanation Education comprehension: verbalized understanding  HOME EXERCISE PROGRAM:   ASSESSMENT:  CLINICAL IMPRESSION: Pt arrives for today's treatment session reporting 3/10 right foot pain.   Today's treatment session concentrating on previously performed exercises due to decreased pain.  STW/M performed to right plantar service to decrease pain and tone.  Normal responses to estim and vaso noted upon removal.  Pt denied any pain at completion of today's treatment session.   OBJECTIVE IMPAIRMENTS: Abnormal gait, decreased activity tolerance, decreased balance, decreased mobility, difficulty walking, decreased ROM, decreased strength, hypomobility, increased edema, impaired sensation, impaired tone, and pain.   ACTIVITY LIMITATIONS: carrying, lifting, standing, squatting, stairs, transfers, and locomotion level  PARTICIPATION LIMITATIONS: meal prep, cleaning, laundry, shopping, community activity, and occupation  PERSONAL FACTORS: Past/current experiences, Time since onset of injury/illness/exacerbation, and 3+ comorbidities: Hypertension, diabetes type 1, current smoker, anxiety, and depression  are also affecting patient's functional outcome.   REHAB POTENTIAL: Fair    CLINICAL DECISION MAKING: Evolving/moderate complexity  EVALUATION COMPLEXITY: Moderate   GOALS: Goals reviewed with patient? Yes  SHORT TERM GOALS: Target date: 10/03/23 Patient will be independent with  her initial HEP. Baseline: Goal status: MET  2.  Patient will be able to complete her daily activities without her familiar symptoms exceeding 8/10. Baseline:  Goal status: IN PROGRESS  3.  Patient will improve her right ankle dorsiflexion to within 5 degrees of neutral for improved gait mechanics. Baseline: -20 degrees Goal status: IN PROGRESS  4.  Patient will improve her right ankle inversion to at least neutral for improved ankle range of motion. Baseline: 3/11: 27 degrees Goal status: MET  LONG TERM GOALS: Target date: 10/24/23  Patient will be independent with her advanced HEP. Baseline:  Goal status: IN PROGRESS  2.  Patient will be able to complete her daily activities without her familiar symptoms exceeding 6/10. Baseline:  Goal status: IN PROGRESS  3.  Patient will report being able to stand for at least 45 minutes without being limited by her familiar symptoms for improved function with her critical job demands. Baseline:  Goal status: IN PROGRESS  4.  Patient will be able to ambulate with minimal to no significant gait deviations for improved functional mobility. Baseline:  Goal status: IN PROGRESS  5.  Patient will be able to lift at least 20 pounds from the floor to her waist with proper lifting mechanics to reduce her risk of reinjury. Baseline:  Goal status: IN PROGRESS  PLAN:  PT FREQUENCY: 2x/week  PT DURATION: 6 weeks  PLANNED INTERVENTIONS: 97164- PT Re-evaluation, 97110-Therapeutic exercises, 97530- Therapeutic activity, 97112- Neuromuscular re-education, 97535- Self Care, 14782- Manual therapy, [phone]- Gait training, [phone]- Electrical stimulation (unattended), Patient/Family education, Balance training, Stair training, Joint mobilization, Cryotherapy, and Moist heat  PLAN FOR NEXT SESSION: NuStep, ankle circles, manual therapy, lower extremity strengthening, and modalities as needed   [employer]  Devin Going, PTA 10/17/2023, 4:25 PM

## 2023-10-18 ENCOUNTER — Other Ambulatory Visit: Payer: Self-pay | Admitting: Internal Medicine

## 2023-10-18 ENCOUNTER — Telehealth: Payer: Self-pay

## 2023-10-18 MED ORDER — GLUCOSE BLOOD VI STRP: ORAL_STRIP | 5 refills | Status: DC

## 2023-10-18 NOTE — Telephone Encounter (Signed)
 Patient left vm indicating her BG levels are reading >300s. She has verified with glucometer. She has been dosing her pump according to sliding scale. She has not been eating much and has not felt very hungry. She would like to know what you recommend. She also requests refills on her test strips, rx sent to pharmacy.   Please advise, thank you!

## 2023-10-18 NOTE — Telephone Encounter (Signed)
 Patient given advice about blood glucose levels management given to patient. No further questions at this time.

## 2023-10-21 ENCOUNTER — Ambulatory Visit

## 2023-10-21 ENCOUNTER — Encounter: Payer: Self-pay | Admitting: Internal Medicine

## 2023-10-21 DIAGNOSIS — M25571 Pain in right ankle and joints of right foot: Secondary | ICD-10-CM | POA: Diagnosis not present

## 2023-10-21 DIAGNOSIS — M25674 Stiffness of right foot, not elsewhere classified: Secondary | ICD-10-CM

## 2023-10-21 NOTE — Therapy (Signed)
 OUTPATIENT PHYSICAL THERAPY LOWER EXTREMITY TREATMENT   Patient Name: Traci Ellison MRN: [id number] DOB:05-Sep-1985, 38 y.o., female Today's Date: 10/21/2023  END OF SESSION:  PT End of Session - 10/21/23 1519     Visit Number 8    Number of Visits 8   8 approved by insurance   Date for PT Re-Evaluation 11/29/23    PT Start Time 1515    PT Stop Time 1616    PT Time Calculation (min) 61 min    Activity Tolerance Patient limited by pain             Past Medical History:  Diagnosis Date   Anxiety    8 months ago   Depression    8 months ago   Diabetes mellitus    type 1--since age 59   Diabetes mellitus type I (HCC)    Headache(784.0)    migraines, last 3 days ago-occurs occ.   History of kidney stones    x 1    Vaginal Pap smear, abnormal    Past Surgical History:  Procedure Laterality Date   CERVICAL BIOPSY  W/ LOOP ELECTRODE EXCISION     childbirth      x2 -last 3 months ago-[facility]   CYST REMOVAL TRUNK Left 04/03/2013   Procedure: EXCISION CHRONIC SEBACEOUS CYST LEFT UPPER CHEST WALL;  Surgeon: Shelly Rubenstein, MD;  Location: [facility];  Service: General;  Laterality: Left;   INDUCED ABORTION     Patient Active Problem List   Diagnosis Date Noted   Type 1 diabetes mellitus with hyperglycemia (HCC) 05/30/2023   Poorly controlled type 1 diabetes mellitus (HCC) 07/06/2021   Essential hypertension, benign 07/06/2021   Mixed hyperlipidemia 07/06/2021   Personal history of noncompliance with medical treatment, presenting hazards to health 03/20/2017   Current smoker 09/28/2016   Uncontrolled type 1 diabetes mellitus with complication 09/21/2016   Pre-existing type 1 diabetes mellitus in pregnancy in first trimester 05/13/2012   Sebaceous cyst 04/28/2012   REFERRING PROVIDER: Louann Sjogren, DPM   REFERRING DIAG: Tendinitis of right ankle   THERAPY DIAG:  Pain in right ankle and joints of right foot  Stiffness of right foot, not elsewhere  classified  Rationale for Evaluation and Treatment: Rehabilitation  ONSET DATE: 06/12/23  SUBJECTIVE:   SUBJECTIVE STATEMENT: Pt denies any pain today.    PERTINENT HISTORY: Hypertension, diabetes type 1, current smoker, anxiety, and depression  PAIN:  Are you having pain? No  PRECAUTIONS: Fall  RED FLAGS: None   WEIGHT BEARING RESTRICTIONS: No  FALLS:  Has patient fallen in last 6 months? Yes. Number of falls 3; she has fallen multiple times since her initial injury because her right leg will give out with the most recent being on 09/04/23.   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  COGNITION: Overall cognitive status: Within functional limits for tasks assessed     SENSATION: Light touch: Impaired with diminished  sensation in right lower leg and plantar surface of the right foot with no dermatomal pattern Patient reports numbness and tingling in her right foot and ankle.  EDEMA:  Figure 8: L: 51.6 cm R: 52.6 cm   PALPATION: TTP: right anterior tibialis, medial gastrocnemius/soleus, fibularis longus, medial malleolus, 4th metatarsal, plantar fascia, and achilles tendon  LOWER EXTREMITY ROM:  Active ROM Right eval Left eval  Hip flexion    Hip extension    Hip abduction    Hip adduction    Hip internal rotation    Hip external rotation    Knee flexion    Knee extension    Ankle  dorsiflexion -12 -1  Ankle plantarflexion 32; familiar pain  45  Ankle inversion -13 23  Ankle eversion 20; familiar pain  18   (Blank rows = not tested)  LOWER EXTREMITY MMT:  MMT Right eval Left eval  Hip flexion    Hip extension    Hip abduction    Hip adduction    Hip internal rotation    Hip external rotation    Knee flexion    Knee extension    Ankle dorsiflexion 3+/5; familiar pain 4+/5  Ankle plantarflexion    Ankle inversion    Ankle eversion     (Blank rows = not tested)  GAIT: Assistive device utilized:  Surgical shoe on the right foot  Level of assistance: Modified independence Comments: step to pattern with decreased stance time on RLE, right foot flat, and absent right foot toe off                                                                                                                               TREATMENT DATE:    10/21/23  EXERCISE LOG  RT foot  Exercise Repetitions and Resistance Comments  Squats 20# x 10 reps   Slant Board 30 sec x 5 reps   Lunges  8" box x 4 mins   Rocker board PF / DF x 5 mins   Nustep  Lvl 4 x 15 mins    Blank cell = exercise not performed today   Manual STW to RT foot Plantar fascia and metatarsal heads Premod x 15 mins 80-150 HZ to RT foot with HMP  10/03/23:  Pulsed combo e'stim/US at 1.50 W/CM2 at 20% on 3.3 mHx x 12 minutes to patient's right affected ankle and foot f/b STW/M x 11 minutes f/b HMP and IFC at 80-150 Hz on 40% scan x 20 minutes.    10/01/23                                    EXERCISE LOG  RT foot  Exercise Repetitions and Resistance Comments  Seated ball mass X 4 mins   Seated Dyna disc X 5 mins hard side   Short foot X 10 hold 5  secs   Rocker board PF / DF x 3 mins        Blank cell = exercise not performed today    Manual STW to RT foot Plantar fascia and metatarsal heads Premod x 15 mins 80-150 HZ to RT foot with HMP  PATIENT EDUCATION:  Education details: plan of care, prognosis,  healing, objective findings, and goals for physical therapy Person educated: Patient Education method: Explanation Education comprehension: verbalized understanding  HOME EXERCISE PROGRAM:   ASSESSMENT:  CLINICAL IMPRESSION: Pt arrives for today's treatment session denying any pain.  Pt states that today is the first day that she has been able to work without right foot pain.  Pt also states that she was able to go walking in a park yesterday for approx. 30 mins with right foot pain only increasing to 2/10.  STW/M performed to right plantar surface to decrease pain and tone.  Normal responses to estim and MH noted upon removal.  Pt has met or is making good progress towards all of her goals at this time.  Pt plans to go on hold at this time and will call the facility in a few weeks to inform us of her plans.  Pt denied any pain at completion of today's treatment session.   OBJECTIVE IMPAIRMENTS: Abnormal gait, decreased activity tolerance, decreased balance, decreased mobility, difficulty walking, decreased ROM, decreased strength, hypomobility, increased edema, impaired sensation, impaired tone, and pain.   ACTIVITY LIMITATIONS: carrying, lifting, standing, squatting, stairs, transfers, and locomotion level  PARTICIPATION LIMITATIONS: meal prep, cleaning, laundry, shopping, community activity, and occupation  PERSONAL FACTORS: Past/current experiences, Time since onset of injury/illness/exacerbation, and 3+ comorbidities: Hypertension, diabetes type 1, current smoker, anxiety, and depression  are also affecting patient's functional outcome.   REHAB POTENTIAL: Fair    CLINICAL DECISION MAKING: Evolving/moderate complexity  EVALUATION COMPLEXITY: Moderate   GOALS: Goals reviewed with patient? Yes  SHORT TERM GOALS: Target date: 10/03/23 Patient will be independent with her initial HEP. Baseline: Goal status: MET  2.  Patient will be able to complete her daily activities without her  familiar symptoms exceeding 8/10. Baseline:  Goal status: MET  3.  Patient will improve her right ankle dorsiflexion to within 5 degrees of neutral for improved gait mechanics. Baseline: -20 degrees Goal status: IN PROGRESS  4.  Patient will improve her right ankle inversion to at least neutral for improved ankle range of motion. Baseline: 3/11: 27 degrees Goal status: MET  LONG TERM GOALS: Target date: 10/24/23  Patient will be independent with her advanced HEP. Baseline:  Goal status: MET  2.  Patient will be able to complete her daily activities without her familiar symptoms exceeding 6/10. Baseline: 3/17: today is the first day Goal status: IN PROGRESS  3.  Patient will report being able to stand for at least 45 minutes without being limited by her familiar symptoms for improved function with her critical job demands. Baseline: 3/17: walked 30 mins the first time since November with 2/10 pain.  Goal status: IN PROGRESS  4.  Patient will be able to ambulate with minimal to no significant gait deviations for improved functional mobility. Baseline:  Goal status: MET  5.  Patient will be able to lift at least 20 pounds from the floor to her waist with proper lifting mechanics to reduce her risk of reinjury. Baseline:  Goal status: MET  PLAN:  PT FREQUENCY: 2x/week  PT DURATION: 6 weeks  PLANNED INTERVENTIONS: 97164- PT Re-evaluation, 97110-Therapeutic exercises, 97530-  Therapeutic activity, [id number]- Neuromuscular re-education, [phone]- Self Care, 19147- Manual therapy, [phone]- Gait training, [phone]- Electrical stimulation (unattended), Patient/Family education, Balance training, Stair training, Joint mobilization, Cryotherapy, and Moist heat  PLAN FOR NEXT SESSION: NuStep, ankle circles, manual therapy, lower extremity strengthening, and modalities as needed   Newman Pies, PTA 10/21/2023, 4:38 PM

## 2023-10-22 ENCOUNTER — Encounter: Admitting: *Deleted

## 2023-10-24 ENCOUNTER — Encounter: Admitting: *Deleted

## 2023-11-04 ENCOUNTER — Ambulatory Visit: Payer: Self-pay | Admitting: Podiatry

## 2023-11-13 ENCOUNTER — Encounter: Payer: Self-pay | Admitting: Podiatry

## 2023-11-19 ENCOUNTER — Ambulatory Visit: Attending: Orthopedic Surgery

## 2023-11-19 DIAGNOSIS — G8929 Other chronic pain: Secondary | ICD-10-CM | POA: Insufficient documentation

## 2023-11-19 DIAGNOSIS — M25511 Pain in right shoulder: Secondary | ICD-10-CM | POA: Diagnosis present

## 2023-11-19 DIAGNOSIS — M25611 Stiffness of right shoulder, not elsewhere classified: Secondary | ICD-10-CM | POA: Insufficient documentation

## 2023-11-19 NOTE — Therapy (Signed)
 OUTPATIENT PHYSICAL THERAPY SHOULDER EVALUATION   Patient Name: Traci Ellison MRN: [id number] DOB:07/01/1986, 38 y.o., female Today's Date: 11/19/2023  END OF SESSION:  PT End of Session - 11/19/23 1104     Visit Number 1    Number of Visits 12    Date for PT Re-Evaluation 01/31/24    PT Start Time 1106    PT Stop Time 1133    PT Time Calculation (min) 27 min    Activity Tolerance Patient limited by pain    Behavior During Therapy WFL for tasks assessed/performed             Past Medical History:  Diagnosis Date   Anxiety    8 months ago   Depression    8 months ago   Diabetes mellitus    type 1--since age 20   Diabetes mellitus type I (HCC)    Headache(784.0)    migraines, last 3 days ago-occurs occ.   History of kidney stones    x 1    Vaginal Pap smear, abnormal    Past Surgical History:  Procedure Laterality Date   CERVICAL BIOPSY  W/ LOOP ELECTRODE EXCISION     childbirth      x2 -last 3 months ago-[facility]   CYST REMOVAL TRUNK Left 04/03/2013   Procedure: EXCISION CHRONIC SEBACEOUS CYST LEFT UPPER CHEST WALL;  Surgeon: Rogena Class, MD;  Location: [facility];  Service: General;  Laterality: Left;   INDUCED ABORTION     Patient Active Problem List   Diagnosis Date Noted   Type 1 diabetes mellitus with hyperglycemia (HCC) 05/30/2023   Poorly controlled type 1 diabetes mellitus (HCC) 07/06/2021   Essential hypertension, benign 07/06/2021   Mixed hyperlipidemia 07/06/2021   Personal history of noncompliance with medical treatment, presenting hazards to health 03/20/2017   Current smoker 09/28/2016   Uncontrolled type 1 diabetes mellitus with complication 09/21/2016   Pre-existing type 1 diabetes mellitus in pregnancy in first trimester 05/13/2012   Sebaceous cyst 04/28/2012   REFERRING PROVIDER: Dodson Freestone, MD   REFERRING DIAG: Pain in right shoulder, Other enthesopathies, not elsewhere classified, Unspecified rotator cuff tear or  rupture of right shoulder, not specified as traumatic, Stiffness of right shoulder, not elsewhere classified   THERAPY DIAG:  Chronic right shoulder pain  Stiffness of right shoulder, not elsewhere classified  Rationale for Evaluation and Treatment: Rehabilitation  ONSET DATE: 2 years ago   SUBJECTIVE:  SUBJECTIVE STATEMENT: Patient reports that she began having right shoulder pain about 2 years ago, but it got worse about 8 months ago with no known cause. She had tried getting cortisone shots, but these did not help any. She is scheduled to get a MRI on Monday (11/25/23). Her pain stays primarily in her right shoulder, but it can radiate down her arm with certain motions such as swinging.  Hand dominance: Right  PERTINENT HISTORY: Hypertension, diabetes type 1, current smoker, anxiety, and depression   PAIN:  Are you having pain? Yes: NPRS scale: Current: 6/10 Best: 3/10 Worst: 10+/10 Pain location: right anterior and lateral shoulder Pain description: constant, sharp, shooting, tingling, aching, and dull  Aggravating factors: laying right side, quick motions, running, donning and doffing clothes, and reaching overhead Relieving factors: none  PRECAUTIONS: None  RED FLAGS: None   WEIGHT BEARING RESTRICTIONS: No  FALLS:  Has patient fallen in last 6 months? 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  PATIENT SURVEYS:  Quick Dash 81.82  COGNITION: Overall cognitive status: Within functional limits for tasks assessed     SENSATION: Patient reports no numbness or tingling  UPPER EXTREMITY ROM: all right shoulder AROM measurements were limited by her familiar pain  Active ROM Right eval Left eval  Shoulder flexion 95 153  Shoulder extension    Shoulder abduction 93 159  Shoulder adduction    Shoulder internal rotation To sacrum To T7  Shoulder external rotation To right upper trapezius To T3  Elbow flexion    Elbow extension    Wrist flexion    Wrist extension    Wrist ulnar deviation    Wrist radial deviation    Wrist pronation    Wrist supination    (Blank rows = not tested)  UPPER EXTREMITY MMT: Right shoulder was not tested due to high pain irritability  MMT Right eval Left eval  Shoulder flexion  4/5  Shoulder extension    Shoulder abduction  4+/5  Shoulder adduction    Shoulder internal rotation  4+/5  Shoulder external rotation  4/5  Middle trapezius    Lower trapezius    Elbow flexion    Elbow extension    Wrist flexion    Wrist extension    Wrist ulnar deviation    Wrist radial deviation    Wrist pronation    Wrist supination    Grip strength (lbs) 20; shooting pain down the arm 30  (Blank rows = not tested)  SHOULDER SPECIAL TESTS: Unable to accurately assess due to high pain irritability  JOINT MOBILITY TESTING:  Unable to accurately assess due to pain  PALPATION:  Shoot pain reproduced with palpation to: right upper trapezius, periscapular muscular, and infraspinatus Familiar pain was reproduced with palpation to short head of the right biceps  TREATMENT DATE:     PATIENT EDUCATION: Education details: POC, prognosis, healing, objective findings, and goals for physical therapy Person educated: Patient Education method: Explanation Education comprehension: verbalized understanding  HOME EXERCISE PROGRAM:   ASSESSMENT:  CLINICAL IMPRESSION: Patient is a 38 y.o. female who was seen today for physical therapy evaluation and treatment for chronic right shoulder pain with no known cause. She presented with moderate to high pain severity and irritability with right shoulder AROM and palpation to her right shoulder musculature reproducing her familiar symptoms. Manual muscle testing to her right shoulder was unable to be completed at this time due to her his symptom irritability. Recommend that she continue with skilled physical therapy to address her impairments to return to her prior level of function.    OBJECTIVE IMPAIRMENTS: decreased activity tolerance, decreased ROM, decreased strength, hypomobility, impaired tone, impaired UE functional use, and pain.   ACTIVITY LIMITATIONS: carrying, lifting, bathing, dressing, reach over head, and hygiene/grooming  PARTICIPATION LIMITATIONS: meal prep, cleaning, laundry, community activity, and occupation  PERSONAL FACTORS: Past/current experiences, Profession, Time since onset of injury/illness/exacerbation, and 3+ comorbidities: Hypertension, diabetes type 1, current smoker, anxiety, and depression   are also affecting patient's functional outcome.   REHAB POTENTIAL: Good  CLINICAL DECISION MAKING: Evolving/moderate complexity  EVALUATION COMPLEXITY: Moderate   GOALS: Goals reviewed with patient? Yes  SHORT TERM GOALS: Target date: 12/10/23  Patient will be independent with her initial HEP.  Baseline: Goal status: INITIAL  2.  Patient will be able to complete her daily activities without her familiar pain exceeding 8/10. Baseline:  Goal status: INITIAL  3.  Patient will be able to demonstrate  at least 115 degrees of active right shoulder flexion for improved function reaching overhead.  Baseline:  Goal status: INITIAL  4.  Patient will be able to demonstrate at least 115 degrees of active right shoulder abduction for improved function reaching overhead.  Baseline:  Goal status: INITIAL  5.  Patient will improve her QuickDASH score to 65 or less for improved perceived function with her daily activities.  Baseline:  Goal status: INITIAL  LONG TERM GOALS: Target date: 12/31/23  Patient will be independent with her advanced HEP.  Baseline:  Goal status: INITIAL  2.  Patient will be able to complete her daily activities without her familiar pain exceeding 6/10. Baseline:  Goal status: INITIAL  3.  Patient will be able to carry at least 8 pounds in her right upper extremity for improved function carrying her groceries.  Baseline:  Goal status: INITIAL  4.  Patient will be able to demonstrate at least 125 degrees of active right shoulder flexion for improved function with her critical job demands.  Baseline:  Goal status: INITIAL  5.  Patient will be able to demonstrate at least 125 degrees of active right shoulder abduction for improved function with overhead activities.  Baseline:  Goal status: INITIAL  6.  Patient will improve her QuickDASH score to 50 or less for improved perceived function with her daily activities.  Baseline:  Goal status: INITIAL  PLAN:  PT FREQUENCY: 2x/week  PT DURATION: 6 weeks  PLANNED INTERVENTIONS: 97164- PT Re-evaluation, 97750- Physical Performance Testing, 97110-Therapeutic exercises, 97530- Therapeutic activity, [id number]- Neuromuscular re-education, 97535- Self Care, 60454- Manual therapy, [id number]- Electrical stimulation (unattended), 97016- Vasopneumatic device, Patient/Family education, Taping, Dry Needling, Joint mobilization, Cryotherapy, and Moist heat  PLAN FOR NEXT SESSION: pulleys, isometrics, AAROM, manual therapy, and modalities  as needed   Granville Lewis, PT 11/19/2023, 6:16  PM

## 2023-11-20 ENCOUNTER — Ambulatory Visit: Admitting: Podiatry

## 2023-12-02 NOTE — Telephone Encounter (Signed)
 lft mess on her vmail for confirmation I can release office notes to [facility] [employer]. I adv she can leave on my vmail if ok He has emailed wanting office notes,work status and next follow up date for her

## 2023-12-03 ENCOUNTER — Ambulatory Visit

## 2023-12-03 DIAGNOSIS — M25511 Pain in right shoulder: Secondary | ICD-10-CM | POA: Diagnosis not present

## 2023-12-03 DIAGNOSIS — G8929 Other chronic pain: Secondary | ICD-10-CM

## 2023-12-03 DIAGNOSIS — M25611 Stiffness of right shoulder, not elsewhere classified: Secondary | ICD-10-CM

## 2023-12-03 NOTE — Therapy (Signed)
 OUTPATIENT PHYSICAL THERAPY SHOULDER EVALUATION   Patient Name: Traci Ellison MRN: [id number] DOB:[birth year], 38 y.o., female Today's Date: 12/03/2023  END OF SESSION:  PT End of Session - 12/03/23 1519     Visit Number 2    Number of Visits 12    Date for PT Re-Evaluation 01/31/24    Authorization Time Period 12/25/23    Authorization - Number of Visits 8     PT Start Time 1515    PT Stop Time 1613    PT Time Calculation (min) 58 min    Activity Tolerance Patient limited by pain    Behavior During Therapy WFL for tasks assessed/performed             Past Medical History:  Diagnosis Date   Anxiety    8 months ago   Depression    8 months ago   Diabetes mellitus    type 1--since age 65   Diabetes mellitus type I (HCC)    Headache(784.0)    migraines, last 3 days ago-occurs occ.   History of kidney stones    x 1    Vaginal Pap smear, abnormal    Past Surgical History:  Procedure Laterality Date   CERVICAL BIOPSY  W/ LOOP ELECTRODE EXCISION     childbirth      x2 -last 3 months ago-[facility]   CYST REMOVAL TRUNK Left 04/03/2013   Procedure: EXCISION CHRONIC SEBACEOUS CYST LEFT UPPER CHEST WALL;  Surgeon: Rogena Class, MD;  Location: [facility];  Service: General;  Laterality: Left;   INDUCED ABORTION     Patient Active Problem List   Diagnosis Date Noted   Type 1 diabetes mellitus with hyperglycemia (HCC) 05/30/2023   Poorly controlled type 1 diabetes mellitus (HCC) 07/06/2021   Essential hypertension, benign 07/06/2021   Mixed hyperlipidemia 07/06/2021   Personal history of noncompliance with medical treatment, presenting hazards to health 03/20/2017   Current smoker 09/28/2016   Uncontrolled type 1 diabetes mellitus with complication 09/21/2016   Pre-existing type 1 diabetes mellitus in pregnancy in first trimester 05/13/2012   Sebaceous cyst 04/28/2012   REFERRING PROVIDER: Dodson Freestone, MD   REFERRING DIAG: Pain in right shoulder, Other  enthesopathies, not elsewhere classified, Unspecified rotator cuff tear or rupture of right shoulder, not specified as traumatic, Stiffness of right shoulder, not elsewhere classified   THERAPY DIAG:  Chronic right shoulder pain  Stiffness of right shoulder, not elsewhere classified  Rationale for Evaluation and Treatment: Rehabilitation  ONSET DATE: 2 years ago   SUBJECTIVE:  SUBJECTIVE STATEMENT: Pt reports 6/10 right shoulder pain today.  Hand dominance: Right  PERTINENT HISTORY: Hypertension, diabetes type 1, current smoker, anxiety, and depression   PAIN:  Are you having pain? Yes: NPRS scale: 6/10 Pain location: right anterior and lateral shoulder Pain description: constant, sharp, shooting, tingling, aching, and dull  Aggravating factors: laying right side, quick motions, running, donning and doffing clothes, and reaching overhead Relieving factors: none  PRECAUTIONS: None  RED FLAGS: None   WEIGHT BEARING RESTRICTIONS: No  FALLS:  Has patient fallen in last 6 months? 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  PATIENT SURVEYS:  Quick Dash 81.82  COGNITION: Overall cognitive status: Within functional limits for tasks  assessed     SENSATION: Patient reports no numbness or tingling  UPPER EXTREMITY ROM: all right shoulder AROM measurements were limited by her familiar pain  Active ROM Right eval Left eval  Shoulder flexion 95 153  Shoulder extension    Shoulder abduction 93 159  Shoulder adduction    Shoulder internal rotation To sacrum To T7  Shoulder external rotation To right upper trapezius To T3  Elbow flexion    Elbow extension    Wrist flexion    Wrist extension    Wrist ulnar deviation    Wrist radial deviation    Wrist pronation    Wrist supination    (Blank rows = not tested)  UPPER EXTREMITY MMT: Right shoulder was not tested due to high pain irritability  MMT Right eval Left eval  Shoulder flexion  4/5  Shoulder extension    Shoulder abduction  4+/5  Shoulder adduction    Shoulder internal rotation  4+/5  Shoulder external rotation  4/5  Middle trapezius    Lower trapezius    Elbow flexion    Elbow extension    Wrist flexion    Wrist extension    Wrist ulnar deviation    Wrist radial deviation    Wrist pronation    Wrist supination    Grip strength (lbs) 20; shooting pain down the arm 30  (Blank rows = not tested)  SHOULDER SPECIAL TESTS: Unable to accurately assess due to high pain irritability  JOINT MOBILITY TESTING:  Unable to accurately assess due to pain  PALPATION:  Shoot pain reproduced with palpation to: right upper trapezius, periscapular muscular, and infraspinatus Familiar pain was reproduced with palpation to short head of the right biceps  TREATMENT DATE:    12/03/23                                  EXERCISE LOG  Exercise Repetitions and Resistance Comments  Pulleys 5 mins   UE Ranger Flex/ext; CW and CCW circles x 2 mins each   Therabar Red to fatigue each way   AA Flexion  15 reps   AA Chest Press 15 reps     Overhead Press 10 reps   Bicep Curls 3-way 2# x 15 reps each way    Blank cell = exercise not performed today   Manual Therapy Soft Tissue Mobilization: right shoulder, STW/M to right anterior deltoid and long head of bicep tendon to decrease pain and tone.     Modalities  Date:  Vaso: Shoulder, 34 degrees; low pressure, 15 mins, Pain   PATIENT EDUCATION: Education details: POC, prognosis, healing, objective findings, and goals for physical therapy Person educated: Patient Education method: Explanation Education comprehension: verbalized understanding  HOME EXERCISE PROGRAM:   ASSESSMENT:  CLINICAL IMPRESSION: Pt arrives for today's treatment session reporting 6/10 right shoulder pain.  Pt introduced to several active and active assisted exercises today to increase strength, function, and ROM while decreasing pain.  Pt requiring min cues for proper technique, pacing, and posture with all newly added exercises.  STW/M performed to right anterior deltoid and long head of bicep to decrease pain and tone.  Normal responses to vaso noted upon removal.  Pt reported minimal decrease in pain at completion of today's treatment session.   OBJECTIVE IMPAIRMENTS: decreased activity tolerance, decreased ROM, decreased strength, hypomobility, impaired tone, impaired UE functional use, and pain.   ACTIVITY LIMITATIONS: carrying, lifting, bathing, dressing, reach over head, and hygiene/grooming  PARTICIPATION LIMITATIONS: meal prep, cleaning, laundry, community activity, and occupation  PERSONAL FACTORS: Past/current experiences, Profession, Time since onset of injury/illness/exacerbation, and 3+ comorbidities: Hypertension, diabetes type 1, current smoker, anxiety, and depression   are also affecting patient's functional outcome.   REHAB POTENTIAL: Good  CLINICAL DECISION MAKING: Evolving/moderate complexity  EVALUATION COMPLEXITY: Moderate   GOALS: Goals reviewed with patient?  Yes  SHORT TERM GOALS: Target date: 12/10/23  Patient will be independent with her initial HEP.  Baseline: Goal status: INITIAL  2.  Patient will be able to complete her daily activities without her familiar pain exceeding 8/10. Baseline:  Goal status: INITIAL  3.  Patient will be able to demonstrate at least 115 degrees of active right shoulder flexion for improved function reaching overhead.  Baseline:  Goal status: INITIAL  4.  Patient will be able to demonstrate at least 115 degrees of active right shoulder abduction for improved function reaching overhead.  Baseline:  Goal status: INITIAL  5.  Patient will improve her QuickDASH score to 65 or less for improved perceived function with her daily activities.  Baseline:  Goal status: INITIAL  LONG TERM GOALS: Target date: 12/31/23  Patient will be independent with her advanced HEP.  Baseline:  Goal status: INITIAL  2.  Patient will be able to complete her daily activities without her familiar pain exceeding 6/10. Baseline:  Goal status: INITIAL  3.  Patient will be able to carry at least 8 pounds in her right upper extremity for improved function carrying her groceries.  Baseline:  Goal status: INITIAL  4.  Patient will be able to demonstrate at least 125 degrees of active right shoulder flexion for  improved function with her critical job demands.  Baseline:  Goal status: INITIAL  5.  Patient will be able to demonstrate at least 125 degrees of active right shoulder abduction for improved function with overhead activities.  Baseline:  Goal status: INITIAL  6.  Patient will improve her QuickDASH score to 50 or less for improved perceived function with her daily activities.  Baseline:  Goal status: INITIAL  PLAN:  PT FREQUENCY: 2x/week  PT DURATION: 6 weeks  PLANNED INTERVENTIONS: 16109- PT Re-evaluation, 97750- Physical Performance Testing, 97110-Therapeutic exercises, 97530- Therapeutic activity, [id number]-  Neuromuscular re-education, 97535- Self Care, [id number]- Manual therapy, G0283- Electrical stimulation (unattended), 97016- Vasopneumatic device, Patient/Family education, Taping, Dry Needling, Joint mobilization, Cryotherapy, and Moist heat  PLAN FOR NEXT SESSION: pulleys, isometrics, AAROM, manual therapy, and modalities as needed   Deryl Flora, PTA 12/03/2023, 4:42 PM

## 2023-12-05 ENCOUNTER — Ambulatory Visit: Attending: Orthopedic Surgery

## 2023-12-05 DIAGNOSIS — M25611 Stiffness of right shoulder, not elsewhere classified: Secondary | ICD-10-CM | POA: Insufficient documentation

## 2023-12-05 DIAGNOSIS — G8929 Other chronic pain: Secondary | ICD-10-CM | POA: Insufficient documentation

## 2023-12-05 DIAGNOSIS — M25511 Pain in right shoulder: Secondary | ICD-10-CM | POA: Insufficient documentation

## 2023-12-05 NOTE — Therapy (Signed)
 OUTPATIENT PHYSICAL THERAPY SHOULDER TREATMENT   Patient Name: Traci Ellison MRN: [id number] DOB:10-23-1985, 38 y.o., female Today's Date: 12/05/2023  END OF SESSION:  PT End of Session - 12/05/23 1514     Visit Number 3    Number of Visits 12    Date for PT Re-Evaluation 01/31/24    Authorization Time Period 12/25/23    Authorization - Number of Visits 8    PT Start Time 1513    PT Stop Time 1617    PT Time Calculation (min) 64 min    Activity Tolerance Patient limited by pain    Behavior During Therapy WFL for tasks assessed/performed             Past Medical History:  Diagnosis Date   Anxiety    8 months ago   Depression    8 months ago   Diabetes mellitus    type 1--since age 68   Diabetes mellitus type I (HCC)    Headache(784.0)    migraines, last 3 days ago-occurs occ.   History of kidney stones    x 1    Vaginal Pap smear, abnormal    Past Surgical History:  Procedure Laterality Date   CERVICAL BIOPSY  W/ LOOP ELECTRODE EXCISION     childbirth      x2 -last 3 months ago-[facility]   CYST REMOVAL TRUNK Left 04/03/2013   Procedure: EXCISION CHRONIC SEBACEOUS CYST LEFT UPPER CHEST WALL;  Surgeon: Rogena Class, MD;  Location: [facility];  Service: General;  Laterality: Left;   INDUCED ABORTION     Patient Active Problem List   Diagnosis Date Noted   Type 1 diabetes mellitus with hyperglycemia (HCC) 05/30/2023   Poorly controlled type 1 diabetes mellitus (HCC) 07/06/2021   Essential hypertension, benign 07/06/2021   Mixed hyperlipidemia 07/06/2021   Personal history of noncompliance with medical treatment, presenting hazards to health 03/20/2017   Current smoker 09/28/2016   Uncontrolled type 1 diabetes mellitus with complication 09/21/2016   Pre-existing type 1 diabetes mellitus in pregnancy in first trimester 05/13/2012   Sebaceous cyst 04/28/2012   REFERRING PROVIDER: Dodson Freestone, MD   REFERRING DIAG: Pain in right shoulder, Other  enthesopathies, not elsewhere classified, Unspecified rotator cuff tear or rupture of right shoulder, not specified as traumatic, Stiffness of right shoulder, not elsewhere classified   THERAPY DIAG:  Chronic right shoulder pain  Stiffness of right shoulder, not elsewhere classified  Rationale for Evaluation and Treatment: Rehabilitation  ONSET DATE: 2 years ago   SUBJECTIVE:  SUBJECTIVE STATEMENT: Pt reports 2/10 right shoulder pain today.  Pt reports increased pain for about two hours after last treatment session.   Hand dominance: Right  PERTINENT HISTORY: Hypertension, diabetes type 1, current smoker, anxiety, and depression   PAIN:  Are you having pain? Yes: NPRS scale: 2/10 Pain location: right anterior and lateral shoulder Pain description: constant, sharp, shooting, tingling, aching, and dull  Aggravating factors: laying right side, quick motions, running, donning and doffing clothes, and reaching overhead Relieving factors: none  PRECAUTIONS: None  RED FLAGS: None   WEIGHT BEARING RESTRICTIONS: No  FALLS:  Has patient fallen in last 6 months? 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  PATIENT SURVEYS:  Quick Dash  81.82  COGNITION: Overall cognitive status: Within functional limits for tasks assessed     SENSATION: Patient reports no numbness or tingling  UPPER EXTREMITY ROM: all right shoulder AROM measurements were limited by her familiar pain  Active ROM Right eval Left eval  Shoulder flexion 95 153  Shoulder extension    Shoulder abduction 93 159  Shoulder adduction    Shoulder internal rotation To sacrum To T7  Shoulder external rotation To right upper trapezius To T3  Elbow flexion    Elbow extension    Wrist flexion    Wrist extension    Wrist ulnar deviation    Wrist radial deviation    Wrist pronation    Wrist supination    (Blank rows = not tested)  UPPER EXTREMITY MMT: Right shoulder was not tested due to high pain irritability  MMT Right eval Left eval  Shoulder flexion  4/5  Shoulder extension    Shoulder abduction  4+/5  Shoulder adduction    Shoulder internal rotation  4+/5  Shoulder external rotation  4/5  Middle trapezius    Lower trapezius    Elbow flexion    Elbow extension    Wrist flexion    Wrist extension    Wrist ulnar deviation    Wrist radial deviation    Wrist pronation    Wrist supination    Grip strength (lbs) 20; shooting pain down the arm 30  (Blank rows = not tested)  SHOULDER SPECIAL TESTS: Unable to accurately assess due to high pain irritability  JOINT MOBILITY TESTING:  Unable to accurately assess due to pain  PALPATION:  Shoot pain reproduced with palpation to: right upper trapezius, periscapular muscular, and infraspinatus Familiar pain was reproduced with palpation to short head of the right biceps  TREATMENT DATE:   12/05/23                                  EXERCISE LOG  Exercise Repetitions and Resistance Comments  Pulleys 5 mins   UE Ranger Flex/ext; CW and CCW circles x 2.5 mins each   Therabar  Red to fatigue each way   AA Flexion  15 reps   AA Chest Press 15 reps    Ball Squeeze 3 mins   Bicep Curls 3-way 2# x 15 reps each way    Blank cell = exercise not performed today   Manual Therapy Soft Tissue Mobilization: right shoulder, STW/M to right anterior deltoid and long head of bicep tendon to decrease pain and tone.     Modalities  Date:  Unattended Estim: Shoulder, IFC 80-150 Hz, 15 mins, Pain and Tone Vaso: Shoulder, 34 degrees; low pressure, 15 mins, Pain   PATIENT EDUCATION: Education details: POC, prognosis, healing, objective findings, and goals for physical therapy Person educated: Patient Education method: Explanation Education comprehension: verbalized understanding  HOME EXERCISE PROGRAM:   ASSESSMENT:  CLINICAL IMPRESSION: Pt arrives for today's treatment session reporting 2/10 right shoulder pain.  Pt able to tolerate increased pain with select exercises today.  Reviewed previously performed exercises due to increased pain after last treatment session.  STW/M performed to right anterior deltoid and long head of the bicep.  Normal responses to estim and vaso noted upon removal.  Pt reported feeling about the same after completion of today's treatment session.   OBJECTIVE IMPAIRMENTS: decreased activity tolerance, decreased ROM, decreased strength, hypomobility, impaired tone, impaired UE functional use, and pain.   ACTIVITY LIMITATIONS: carrying, lifting, bathing, dressing, reach over head, and hygiene/grooming  PARTICIPATION LIMITATIONS: meal prep, cleaning, laundry, community activity, and occupation  PERSONAL FACTORS: Past/current experiences, Profession, Time since onset of injury/illness/exacerbation, and 3+ comorbidities: Hypertension, diabetes type 1, current smoker, anxiety, and depression   are also affecting patient's functional outcome.   REHAB POTENTIAL: Good  CLINICAL DECISION MAKING: Evolving/moderate complexity  EVALUATION COMPLEXITY:  Moderate   GOALS: Goals reviewed with patient? Yes  SHORT TERM GOALS: Target date: 12/10/23  Patient will be independent with her initial HEP.  Baseline: Goal status: INITIAL  2.  Patient will be able to complete her daily activities without her familiar pain exceeding 8/10. Baseline:  Goal status: INITIAL  3.  Patient will be able to demonstrate at least 115 degrees of active right shoulder flexion for improved function reaching overhead.  Baseline:  Goal status: INITIAL  4.  Patient will be able to demonstrate at least 115 degrees of active right shoulder abduction for improved function reaching overhead.  Baseline:  Goal status: INITIAL  5.  Patient will improve her QuickDASH score to 65 or less for improved perceived function with her daily activities.  Baseline:  Goal status: INITIAL  LONG TERM GOALS: Target date: 12/31/23  Patient will be independent with her advanced HEP.  Baseline:  Goal status: INITIAL  2.  Patient will be able to complete her daily activities without her familiar pain exceeding 6/10. Baseline:  Goal status: INITIAL  3.  Patient will be able to carry at least 8 pounds in her right upper extremity for improved function carrying her groceries.  Baseline:  Goal status: INITIAL  4.  Patient will be able to demonstrate at least 125 degrees of active right shoulder flexion for improved function with her  critical job demands.  Baseline:  Goal status: INITIAL  5.  Patient will be able to demonstrate at least 125 degrees of active right shoulder abduction for improved function with overhead activities.  Baseline:  Goal status: INITIAL  6.  Patient will improve her QuickDASH score to 50 or less for improved perceived function with her daily activities.  Baseline:  Goal status: INITIAL  PLAN:  PT FREQUENCY: 2x/week  PT DURATION: 6 weeks  PLANNED INTERVENTIONS: 78469- PT Re-evaluation, 97750- Physical Performance Testing, 97110-Therapeutic  exercises, 97530- Therapeutic activity, [id number]- Neuromuscular re-education, 97535- Self Care, 62952- Manual therapy, G0283- Electrical stimulation (unattended), 97016- Vasopneumatic device, Patient/Family education, Taping, Dry Needling, Joint mobilization, Cryotherapy, and Moist heat  PLAN FOR NEXT SESSION: pulleys, isometrics, AAROM, manual therapy, and modalities as needed   Deryl Flora, PTA 12/05/2023, 4:36 PM

## 2023-12-10 ENCOUNTER — Ambulatory Visit

## 2023-12-10 DIAGNOSIS — M25511 Pain in right shoulder: Secondary | ICD-10-CM | POA: Diagnosis not present

## 2023-12-10 DIAGNOSIS — M25611 Stiffness of right shoulder, not elsewhere classified: Secondary | ICD-10-CM

## 2023-12-10 DIAGNOSIS — G8929 Other chronic pain: Secondary | ICD-10-CM

## 2023-12-10 NOTE — Therapy (Signed)
 OUTPATIENT PHYSICAL THERAPY SHOULDER TREATMENT   Patient Name: Traci Ellison MRN: [id number] DOB:1985/09/03, 38 y.o., female Today's Date: 12/10/2023  END OF SESSION:  PT End of Session - 12/10/23 1521     Visit Number 4    Number of Visits 12    Date for PT Re-Evaluation 01/31/24    Authorization Time Period 12/25/23    Authorization - Number of Visits 8    PT Start Time 1515    PT Stop Time 1614    PT Time Calculation (min) 59 min    Activity Tolerance Patient limited by pain    Behavior During Therapy WFL for tasks assessed/performed             Past Medical History:  Diagnosis Date   Anxiety    8 months ago   Depression    8 months ago   Diabetes mellitus    type 1--since age 35   Diabetes mellitus type I (HCC)    Headache(784.0)    migraines, last 3 days ago-occurs occ.   History of kidney stones    x 1    Vaginal Pap smear, abnormal    Past Surgical History:  Procedure Laterality Date   CERVICAL BIOPSY  W/ LOOP ELECTRODE EXCISION     childbirth      x2 -last 3 months ago-[facility]   CYST REMOVAL TRUNK Left 04/03/2013   Procedure: EXCISION CHRONIC SEBACEOUS CYST LEFT UPPER CHEST WALL;  Surgeon: Rogena Class, MD;  Location: [facility];  Service: General;  Laterality: Left;   INDUCED ABORTION     Patient Active Problem List   Diagnosis Date Noted   Type 1 diabetes mellitus with hyperglycemia (HCC) 05/30/2023   Poorly controlled type 1 diabetes mellitus (HCC) 07/06/2021   Essential hypertension, benign 07/06/2021   Mixed hyperlipidemia 07/06/2021   Personal history of noncompliance with medical treatment, presenting hazards to health 03/20/2017   Current smoker 09/28/2016   Uncontrolled type 1 diabetes mellitus with complication 09/21/2016   Pre-existing type 1 diabetes mellitus in pregnancy in first trimester 05/13/2012   Sebaceous cyst 04/28/2012   REFERRING PROVIDER: Dodson Freestone, MD   REFERRING DIAG: Pain in right shoulder, Other  enthesopathies, not elsewhere classified, Unspecified rotator cuff tear or rupture of right shoulder, not specified as traumatic, Stiffness of right shoulder, not elsewhere classified   THERAPY DIAG:  Chronic right shoulder pain  Stiffness of right shoulder, not elsewhere classified  Rationale for Evaluation and Treatment: Rehabilitation  ONSET DATE: 2 years ago   SUBJECTIVE:  SUBJECTIVE STATEMENT: Pt reports 10/10 right shoulder pain today.  Pt states that she unloaded a truck at work by herself today.   Hand dominance: Right  PERTINENT HISTORY: Hypertension, diabetes type 1, current smoker, anxiety, and depression   PAIN:  Are you having pain? Yes: NPRS scale: 10/10 Pain location: right anterior and lateral shoulder Pain description: constant, sharp, shooting, tingling, aching, and dull  Aggravating factors: laying right side, quick motions, running, donning and doffing clothes, and reaching overhead Relieving factors: none  PRECAUTIONS: None  RED FLAGS: None   WEIGHT BEARING RESTRICTIONS: No  FALLS:  Has patient fallen in last 6 months? 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  PATIENT SURVEYS:  Quick Dash 81.82  COGNITION: Overall  cognitive status: Within functional limits for tasks assessed     SENSATION: Patient reports no numbness or tingling  UPPER EXTREMITY ROM: all right shoulder AROM measurements were limited by her familiar pain  Active ROM Right eval Left eval  Shoulder flexion 95 153  Shoulder extension    Shoulder abduction 93 159  Shoulder adduction    Shoulder internal rotation To sacrum To T7  Shoulder external rotation To right upper trapezius To T3  Elbow flexion    Elbow extension    Wrist flexion    Wrist extension    Wrist ulnar deviation    Wrist radial deviation    Wrist pronation    Wrist supination    (Blank rows = not tested)  UPPER EXTREMITY MMT: Right shoulder was not tested due to high pain irritability  MMT Right eval Left eval  Shoulder flexion  4/5  Shoulder extension    Shoulder abduction  4+/5  Shoulder adduction    Shoulder internal rotation  4+/5  Shoulder external rotation  4/5  Middle trapezius    Lower trapezius    Elbow flexion    Elbow extension    Wrist flexion    Wrist extension    Wrist ulnar deviation    Wrist radial deviation    Wrist pronation    Wrist supination    Grip strength (lbs) 20; shooting pain down the arm 30  (Blank rows = not tested)  SHOULDER SPECIAL TESTS: Unable to accurately assess due to high pain irritability  JOINT MOBILITY TESTING:  Unable to accurately assess due to pain  PALPATION:  Shoot pain reproduced with palpation to: right upper trapezius, periscapular muscular, and infraspinatus Familiar pain was reproduced with palpation to short head of the right biceps  TREATMENT DATE:   12/10/23                                  EXERCISE LOG  Exercise Repetitions and Resistance Comments  Pulleys 5  mins   UE Ranger Flex/ext; CW and CCW circles x 3 mins each   Therabar    AA Flexion     AA Chest  Press    Ball Squeeze    Bicep Curls 3-way     Blank cell = exercise not performed today   Manual Therapy Soft Tissue Mobilization: right shoulder, STW/M to right anterior deltoid and long head of bicep tendon to decrease pain and tone.     Modalities  Date:  Unattended Estim: Shoulder, IFC 80-150 Hz, 15 mins, Pain and Tone Vaso: Shoulder, 34 degrees; low pressure, 15 mins, Pain   PATIENT EDUCATION: Education details: POC, prognosis, healing, objective findings, and goals for physical therapy Person educated: Patient Education method: Explanation Education comprehension: verbalized understanding  HOME EXERCISE PROGRAM:   ASSESSMENT:  CLINICAL IMPRESSION: Pt arrives for today's treatment session reporting 10/10 right shoulder pain.  Pt reports that she had to unload a truck at work by herself today.  Due to increased pain, today's treatment session concentrating on pain relieving exercises and activities.  STW/M performed to right middle deltoid and bicep as well as right upper trap to decrease pain and tone.  Normal responses to estim and vaso noted upon removal.  Pt reported minimal decrease in pain at completion of today's treatment session.   OBJECTIVE IMPAIRMENTS: decreased activity tolerance, decreased ROM, decreased strength, hypomobility, impaired tone, impaired UE functional use, and pain.   ACTIVITY LIMITATIONS: carrying, lifting, bathing, dressing, reach over head, and hygiene/grooming  PARTICIPATION LIMITATIONS: meal prep, cleaning, laundry, community activity, and occupation  PERSONAL FACTORS: Past/current experiences, Profession, Time since onset of injury/illness/exacerbation, and 3+ comorbidities: Hypertension, diabetes type 1, current smoker, anxiety, and depression   are also affecting patient's functional outcome.   REHAB POTENTIAL: Good  CLINICAL DECISION MAKING: Evolving/moderate complexity  EVALUATION COMPLEXITY: Moderate   GOALS: Goals reviewed with  patient? Yes  SHORT TERM GOALS: Target date: 12/10/23  Patient will be independent with her initial HEP.  Baseline: Goal status: INITIAL  2.  Patient will be able to complete her daily activities without her familiar pain exceeding 8/10. Baseline:  Goal status: INITIAL  3.  Patient will be able to demonstrate at least 115 degrees of active right shoulder flexion for improved function reaching overhead.  Baseline:  Goal status: INITIAL  4.  Patient will be able to demonstrate at least 115 degrees of active right shoulder abduction for improved function reaching overhead.  Baseline:  Goal status: INITIAL  5.  Patient will improve her QuickDASH score to 65 or less for improved perceived function with her daily activities.  Baseline:  Goal status: INITIAL  LONG TERM GOALS: Target date: 12/31/23  Patient will be independent with her advanced HEP.  Baseline:  Goal status: INITIAL  2.  Patient will be able to complete her daily activities without her familiar pain exceeding 6/10. Baseline:  Goal status: INITIAL  3.  Patient will be able to carry at least 8 pounds in her right upper extremity for improved function carrying her groceries.  Baseline:  Goal status: INITIAL  4.  Patient will be able to demonstrate at least 125 degrees of active right shoulder flexion for improved function with  her critical job demands.  Baseline:  Goal status: INITIAL  5.  Patient will be able to demonstrate at least 125 degrees of active right shoulder abduction for improved function with overhead activities.  Baseline:  Goal status: INITIAL  6.  Patient will improve her QuickDASH score to 50 or less for improved perceived function with her daily activities.  Baseline:  Goal status: INITIAL  PLAN:  PT FREQUENCY: 2x/week  PT DURATION: 6 weeks  PLANNED INTERVENTIONS: 78295- PT Re-evaluation, 97750- Physical Performance Testing, 97110-Therapeutic exercises, 97530- Therapeutic activity, [id number]-  Neuromuscular re-education, 97535- Self Care, 62130- Manual therapy, [id number]- Electrical stimulation (unattended), 97016- Vasopneumatic device, Patient/Family education, Taping, Dry Needling, Joint mobilization, Cryotherapy, and Moist heat  PLAN FOR NEXT SESSION: pulleys, isometrics, AAROM, manual therapy, and modalities as needed   Deryl Flora, PTA 12/10/2023, 4:44 PM

## 2023-12-12 ENCOUNTER — Ambulatory Visit

## 2023-12-16 NOTE — Telephone Encounter (Signed)
 Adv Mr. Peppers the pt did not have an appt on 11/20/23. I went and saw we have prev released information to him. See prev notes. She had not called me back before.

## 2023-12-17 ENCOUNTER — Ambulatory Visit

## 2023-12-17 DIAGNOSIS — G8929 Other chronic pain: Secondary | ICD-10-CM

## 2023-12-17 DIAGNOSIS — M25511 Pain in right shoulder: Secondary | ICD-10-CM | POA: Diagnosis not present

## 2023-12-17 DIAGNOSIS — M25611 Stiffness of right shoulder, not elsewhere classified: Secondary | ICD-10-CM

## 2023-12-17 NOTE — Therapy (Signed)
 OUTPATIENT PHYSICAL THERAPY SHOULDER TREATMENT   Patient Name: KAIJAH CASTROGIOVANNI MRN: [id number] DOB:16-May-1986, 38 y.o., female Today's Date: 12/17/2023  END OF SESSION:  PT End of Session - 12/17/23 1513     Visit Number 5    Number of Visits 12    Date for PT Re-Evaluation 01/31/24    Authorization Time Period 12/25/23    Authorization - Number of Visits 8    PT Start Time 1513    PT Stop Time 1617    PT Time Calculation (min) 64 min    Activity Tolerance Patient limited by pain    Behavior During Therapy [facility] for tasks assessed/performed             Past Medical History:  Diagnosis Date   Anxiety    8 months ago   Depression    8 months ago   Diabetes mellitus    type 1--since age 37   Diabetes mellitus type I (HCC)    Headache(784.0)    migraines, last 3 days ago-occurs occ.   History of kidney stones    x 1    Vaginal Pap smear, abnormal    Past Surgical History:  Procedure Laterality Date   CERVICAL BIOPSY  W/ LOOP ELECTRODE EXCISION     childbirth      x2 -last 3 months ago-[facility]   CYST REMOVAL TRUNK Left 04/03/2013   Procedure: EXCISION CHRONIC SEBACEOUS CYST LEFT UPPER CHEST WALL;  Surgeon: Rogena Class, MD;  Location: [facility];  Service: General;  Laterality: Left;   INDUCED ABORTION     Patient Active Problem List   Diagnosis Date Noted   Type 1 diabetes mellitus with hyperglycemia (HCC) 05/30/2023   Poorly controlled type 1 diabetes mellitus (HCC) 07/06/2021   Essential hypertension, benign 07/06/2021   Mixed hyperlipidemia 07/06/2021   Personal history of noncompliance with medical treatment, presenting hazards to health 03/20/2017   Current smoker 09/28/2016   Uncontrolled type 1 diabetes mellitus with complication 09/21/2016   Pre-existing type 1 diabetes mellitus in pregnancy in first trimester 05/13/2012   Sebaceous cyst 04/28/2012   REFERRING PROVIDER: Dodson Freestone, MD   REFERRING DIAG: Pain in right shoulder, Other  enthesopathies, not elsewhere classified, Unspecified rotator cuff tear or rupture of right shoulder, not specified as traumatic, Stiffness of right shoulder, not elsewhere classified   THERAPY DIAG:  Chronic right shoulder pain  Stiffness of right shoulder, not elsewhere classified  Rationale for Evaluation and Treatment: Rehabilitation  ONSET DATE: 2 years ago   SUBJECTIVE:  SUBJECTIVE STATEMENT: Pt reports 6/10 right shoulder pain today.   Pt states that pain was "12/10" when lifting milk crates at work.   Hand dominance: Right  PERTINENT HISTORY: Hypertension, diabetes type 1, current smoker, anxiety, and depression   PAIN:  Are you having pain? Yes: NPRS scale: 6/10 Pain location: right anterior and lateral shoulder Pain description: constant, sharp, shooting, tingling, aching, and dull  Aggravating factors: laying right side, quick motions, running, donning and doffing clothes, and reaching overhead Relieving factors: none  PRECAUTIONS: None  RED FLAGS: None   WEIGHT BEARING RESTRICTIONS: No  FALLS:  Has patient fallen in last 6 months? 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  PATIENT SURVEYS:  Quick Dash 81.82  COGNITION: Overall  cognitive status: Within functional limits for tasks assessed     SENSATION: Patient reports no numbness or tingling  UPPER EXTREMITY ROM: all right shoulder AROM measurements were limited by her familiar pain  Active ROM Right eval Left eval  Shoulder flexion 95 153  Shoulder extension    Shoulder abduction 93 159  Shoulder adduction    Shoulder internal rotation To sacrum To T7  Shoulder external rotation To right upper trapezius To T3  Elbow flexion    Elbow extension    Wrist flexion    Wrist extension    Wrist ulnar deviation    Wrist radial deviation    Wrist pronation    Wrist supination    (Blank rows = not tested)  UPPER EXTREMITY MMT: Right shoulder was not tested due to high pain irritability  MMT Right eval Left eval  Shoulder flexion  4/5  Shoulder extension    Shoulder abduction  4+/5  Shoulder adduction    Shoulder internal rotation  4+/5  Shoulder external rotation  4/5  Middle trapezius    Lower trapezius    Elbow flexion    Elbow extension    Wrist flexion    Wrist extension    Wrist ulnar deviation    Wrist radial deviation    Wrist pronation    Wrist supination    Grip strength (lbs) 20; shooting pain down the arm 30  (Blank rows = not tested)  SHOULDER SPECIAL TESTS: Unable to accurately assess due to high pain irritability  JOINT MOBILITY TESTING:  Unable to accurately assess due to pain  PALPATION:  Shoot pain reproduced with palpation to: right upper trapezius, periscapular muscular, and infraspinatus Familiar pain was reproduced with palpation to short head of the right biceps  TREATMENT DATE:   12/17/23                                  EXERCISE LOG  Exercise Repetitions and Resistance Comments  Pulleys 5  mins   UE Ranger Flex/ext; CW and CCW circles x 3 mins each   Wall Wash 2 mins   IR stretch 20 secs  x 4 reps   ER stretch 2 mins   Rows Yellow x 20 reps bil   Extensions Yellow x 20 reps bil   Therabar    AA Flexion     AA Chest Press    Ball Squeeze    Bicep Curls 3-way     Blank cell = exercise not performed today   Manual Therapy Soft Tissue Mobilization: right shoulder, STW/M to right anterior deltoid and long head of bicep tendon to decrease pain and tone.    Modalities  Date:  Unattended Estim: Shoulder, IFC 80-150 Hz, 15 mins, Pain and Tone Vaso: Shoulder, 34 degrees; low pressure, 15 mins, Pain   PATIENT EDUCATION: Education details: POC, prognosis, healing, objective findings, and goals for physical therapy Person educated: Patient Education method: Explanation Education comprehension: verbalized understanding  HOME EXERCISE PROGRAM: [email or web address]  Access Code: [email or web address]  ASSESSMENT:  CLINICAL IMPRESSION: Pt arrives for today's treatment session reporting 6/10 right shoulder pain. Pt reports that pain got up to "12/10" while lifting milk crates at work.  Pt able to demonstrate 117 degrees of active right shoulder flexion and 118 degrees of active right shoulder abduction, meeting both of his short term goals.  Pt also able to decrease QuickDash to 52.3 meeting her STG for that as well. Pt introduced to resisted rows and extensions today with good results.  Pt requiring cues to remain within pain free ROM.  STW/M performed to right shoulder with concentration given to long head of the bicep.  Normal responses to estim and vaso noted upon removal.  Pt reported decreased right shoulder pain at completion of today's treatment session.  OBJECTIVE IMPAIRMENTS: decreased activity tolerance, decreased ROM, decreased strength, hypomobility, impaired tone, impaired UE functional use, and pain.   ACTIVITY LIMITATIONS: carrying, lifting, bathing, dressing, reach over head, and hygiene/grooming  PARTICIPATION LIMITATIONS: meal prep, cleaning, laundry,  community activity, and occupation  PERSONAL FACTORS: Past/current experiences, Profession, Time since onset of injury/illness/exacerbation, and 3+ comorbidities: Hypertension, diabetes type 1, current smoker, anxiety, and depression  are also affecting patient's functional outcome.   REHAB POTENTIAL: Good  CLINICAL DECISION MAKING: Evolving/moderate complexity  EVALUATION COMPLEXITY: Moderate   GOALS: Goals reviewed with patient? Yes  SHORT TERM GOALS: Target date: 12/10/23  Patient will be independent with her initial HEP.  Baseline: Goal status: MET  2.  Patient will be able to complete her daily activities without her familiar pain exceeding 8/10. Baseline: 5/13: "as long as lifting is not involved" Goal status: PARTIALLY MET  3.  Patient will be able to demonstrate at least 115 degrees of active right shoulder flexion for improved function reaching overhead.  Baseline: 5/13: 117 degrees Goal status:MET  4.  Patient will be able to demonstrate at least 115 degrees of active right shoulder abduction for improved function reaching overhead.  Baseline: 5/13: 118 degrees Goal status: MET  5.  Patient will improve her QuickDASH score to 65 or less for improved perceived function with her daily activities.  Baseline: 5/13: 52.3  Goal status: MET  LONG TERM  GOALS: Target date: 12/31/23  Patient will be independent with her advanced HEP.  Baseline:  Goal status: IN PROGRESS  2.  Patient will be able to complete her daily activities without her familiar pain exceeding 6/10. Baseline:  Goal status: IN PROGRESS  3.  Patient will be able to carry at least 8 pounds in her right upper extremity for improved function carrying her groceries.  Baseline:  Goal status: IN PROGRESS  4.  Patient will be able to demonstrate at least 125 degrees of active right shoulder flexion for improved function with her critical job demands.  Baseline:  Goal status: IN PROGRESS  5.  Patient will  be able to demonstrate at least 125 degrees of active right shoulder abduction for improved function with overhead activities.  Baseline:  Goal status: IN PROGRESS  6.  Patient will improve her QuickDASH score to 50 or less for improved perceived function with her daily activities.  Baseline:  Goal status: IN PROGRESS  PLAN:  PT FREQUENCY: 2x/week  PT DURATION: 6 weeks  PLANNED INTERVENTIONS: 97164- PT Re-evaluation, 97750- Physical Performance Testing, 97110-Therapeutic exercises, 97530- Therapeutic activity, [id number]- Neuromuscular re-education, 97535- Self Care, 16109- Manual therapy, [id number]- Electrical stimulation (unattended), 97016- Vasopneumatic device, Patient/Family education, Taping, Dry Needling, Joint mobilization, Cryotherapy, and Moist heat  PLAN FOR NEXT SESSION: pulleys, isometrics, AAROM, manual therapy, and modalities as needed   Deryl Flora, PTA 12/17/2023, 5:45 PM

## 2023-12-19 ENCOUNTER — Ambulatory Visit

## 2023-12-19 DIAGNOSIS — M25611 Stiffness of right shoulder, not elsewhere classified: Secondary | ICD-10-CM

## 2023-12-19 DIAGNOSIS — M25511 Pain in right shoulder: Secondary | ICD-10-CM | POA: Diagnosis not present

## 2023-12-19 DIAGNOSIS — G8929 Other chronic pain: Secondary | ICD-10-CM

## 2023-12-19 NOTE — Therapy (Signed)
 OUTPATIENT PHYSICAL THERAPY SHOULDER TREATMENT   Patient Name: ALANAH DESIR MRN: [id number] DOB:04/25/1986, 38 y.o., female Today's Date: 12/19/2023  END OF SESSION:  PT End of Session - 12/19/23 1516     Visit Number 6    Number of Visits 12    Date for PT Re-Evaluation 01/31/24    Authorization Time Period 12/25/23    Authorization - Number of Visits 8    PT Start Time 1515    PT Stop Time 1616    PT Time Calculation (min) 61 min    Activity Tolerance Patient limited by pain    Behavior During Therapy WFL for tasks assessed/performed             Past Medical History:  Diagnosis Date   Anxiety    8 months ago   Depression    8 months ago   Diabetes mellitus    type 1--since age 37   Diabetes mellitus type I (HCC)    Headache(784.0)    migraines, last 3 days ago-occurs occ.   History of kidney stones    x 1    Vaginal Pap smear, abnormal    Past Surgical History:  Procedure Laterality Date   CERVICAL BIOPSY  W/ LOOP ELECTRODE EXCISION     childbirth      x2 -last 3 months ago-[facility]   CYST REMOVAL TRUNK Left 04/03/2013   Procedure: EXCISION CHRONIC SEBACEOUS CYST LEFT UPPER CHEST WALL;  Surgeon: Rogena Class, MD;  Location: [facility];  Service: General;  Laterality: Left;   INDUCED ABORTION     Patient Active Problem List   Diagnosis Date Noted   Type 1 diabetes mellitus with hyperglycemia (HCC) 05/30/2023   Poorly controlled type 1 diabetes mellitus (HCC) 07/06/2021   Essential hypertension, benign 07/06/2021   Mixed hyperlipidemia 07/06/2021   Personal history of noncompliance with medical treatment, presenting hazards to health 03/20/2017   Current smoker 09/28/2016   Uncontrolled type 1 diabetes mellitus with complication 09/21/2016   Pre-existing type 1 diabetes mellitus in pregnancy in first trimester 05/13/2012   Sebaceous cyst 04/28/2012   REFERRING PROVIDER: Dodson Freestone, MD   REFERRING DIAG: Pain in right shoulder, Other  enthesopathies, not elsewhere classified, Unspecified rotator cuff tear or rupture of right shoulder, not specified as traumatic, Stiffness of right shoulder, not elsewhere classified   THERAPY DIAG:  Chronic right shoulder pain  Stiffness of right shoulder, not elsewhere classified  Rationale for Evaluation and Treatment: Rehabilitation  ONSET DATE: 2 years ago   SUBJECTIVE:  SUBJECTIVE STATEMENT: Pt reports 8/10 right shoulder pain today.  Pt used her shoulder a lot at work today.    Hand dominance: Right  PERTINENT HISTORY: Hypertension, diabetes type 1, current smoker, anxiety, and depression   PAIN:  Are you having pain? Yes: NPRS scale: 8/10 Pain location: right anterior and lateral shoulder Pain description: constant, sharp, shooting, tingling, aching, and dull  Aggravating factors: laying right side, quick motions, running, donning and doffing clothes, and reaching overhead Relieving factors: none  PRECAUTIONS: None  RED FLAGS: None   WEIGHT BEARING RESTRICTIONS: No  FALLS:  Has patient fallen in last 6 months? 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  PATIENT SURVEYS:  Quick Dash 81.82  COGNITION: Overall cognitive status: Within  functional limits for tasks assessed     SENSATION: Patient reports no numbness or tingling  UPPER EXTREMITY ROM: all right shoulder AROM measurements were limited by her familiar pain  Active ROM Right eval Left eval  Shoulder flexion 95 153  Shoulder extension    Shoulder abduction 93 159  Shoulder adduction    Shoulder internal rotation To sacrum To T7  Shoulder external rotation To right upper trapezius To T3  Elbow flexion    Elbow extension    Wrist flexion    Wrist extension    Wrist ulnar deviation    Wrist radial deviation    Wrist pronation    Wrist supination    (Blank rows = not tested)  UPPER EXTREMITY MMT: Right shoulder was not tested due to high pain irritability  MMT Right eval Left eval  Shoulder flexion  4/5  Shoulder extension    Shoulder abduction  4+/5  Shoulder adduction    Shoulder internal rotation  4+/5  Shoulder external rotation  4/5  Middle trapezius    Lower trapezius    Elbow flexion    Elbow extension    Wrist flexion    Wrist extension    Wrist ulnar deviation    Wrist radial deviation    Wrist pronation    Wrist supination    Grip strength (lbs) 20; shooting pain down the arm 30  (Blank rows = not tested)  SHOULDER SPECIAL TESTS: Unable to accurately assess due to high pain irritability  JOINT MOBILITY TESTING:  Unable to accurately assess due to pain  PALPATION:  Shoot pain reproduced with palpation to: right upper trapezius, periscapular muscular, and infraspinatus Familiar pain was reproduced with palpation to short head of the right biceps  TREATMENT DATE:   12/19/23                                  EXERCISE LOG  Exercise Repetitions and Resistance Comments  Pulleys 5  mins   UE Ranger Flex/ext; CW and CCW circles x 3 mins each   Wall Wash    IR stretch    ER stretch    Rows    Extensions     Therabar    AA Flexion     AA Chest Press    Ball Squeeze    Bicep Curls 3-way     Blank cell = exercise not performed today   Manual Therapy Soft Tissue Mobilization: right shoulder, STW/M and trigger point release to right anterior deltoid and long head of bicep tendon to decrease pain and tone.    Modalities  Date:  Unattended Estim: Shoulder, IFC 80-150 Hz, 15 mins, Pain and Tone Vaso: Shoulder, 34 degrees; low pressure, 15 mins, Pain   PATIENT EDUCATION: Education details: POC, prognosis, healing, objective findings, and goals for physical therapy Person educated: Patient Education method: Explanation Education comprehension: verbalized understanding  HOME EXERCISE PROGRAM: [email or web address]  Access Code: [email or web address]  ASSESSMENT:  CLINICAL IMPRESSION: Pt arrives for today's treatment session reporting 8/10 right shoulder pain.  Pt states that her work required her to use her right UE a lot today which has caused her increase in pain.  Due to increased pain today session limited to [facility] and manual therapy.  STW/M and trigger point release performed to right deltoid and bicep to decrease pain and tone.  Normal responses to estim and vaso noted upon removal.  Pt reported 6/10 right shoulder pain at completion of today's treatment session.   OBJECTIVE IMPAIRMENTS: decreased activity tolerance, decreased ROM, decreased strength, hypomobility, impaired tone, impaired UE functional use, and pain.   ACTIVITY LIMITATIONS: carrying, lifting, bathing, dressing, reach over head, and hygiene/grooming  PARTICIPATION LIMITATIONS: meal prep, cleaning, laundry, community activity, and occupation  PERSONAL FACTORS: Past/current experiences, Profession, Time since onset of injury/illness/exacerbation, and 3+ comorbidities: Hypertension, diabetes type 1, current smoker, anxiety, and depression  are also affecting patient's functional outcome.   REHAB POTENTIAL:  Good  CLINICAL DECISION MAKING: Evolving/moderate complexity  EVALUATION COMPLEXITY: Moderate   GOALS: Goals reviewed with patient? Yes  SHORT TERM GOALS: Target date: 12/10/23  Patient will be independent with her initial HEP.  Baseline: Goal status: MET  2.  Patient will be able to complete her daily activities without her familiar pain exceeding 8/10. Baseline: 5/13: "as long as lifting is not involved" Goal status: PARTIALLY MET  3.  Patient will be able to demonstrate at least 115 degrees of active right shoulder flexion for improved function reaching overhead.  Baseline: 5/13: 117 degrees Goal status:MET  4.  Patient will be able to demonstrate at least 115 degrees of active right shoulder abduction for improved function reaching overhead.  Baseline: 5/13: 118 degrees Goal status: MET  5.  Patient will improve her QuickDASH score to 65 or less for improved perceived function with her daily activities.  Baseline: 5/13: 52.3  Goal status: MET  LONG TERM GOALS: Target date: 12/31/23  Patient will be independent with her advanced HEP.  Baseline:  Goal status: IN PROGRESS  2.  Patient will be able to complete her daily activities without her familiar pain exceeding 6/10. Baseline:  Goal status: IN PROGRESS  3.  Patient will  be able to carry at least 8 pounds in her right upper extremity for improved function carrying her groceries.  Baseline:  Goal status: IN PROGRESS  4.  Patient will be able to demonstrate at least 125 degrees of active right shoulder flexion for improved function with her critical job demands.  Baseline:  Goal status: IN PROGRESS  5.  Patient will be able to demonstrate at least 125 degrees of active right shoulder abduction for improved function with overhead activities.  Baseline:  Goal status: IN PROGRESS  6.  Patient will improve her QuickDASH score to 50 or less for improved perceived function with her daily activities.  Baseline:  Goal  status: IN PROGRESS  PLAN:  PT FREQUENCY: 2x/week  PT DURATION: 6 weeks  PLANNED INTERVENTIONS: 97164- PT Re-evaluation, 97750- Physical Performance Testing, 97110-Therapeutic exercises, 97530- Therapeutic activity, [id number]- Neuromuscular re-education, 97535- Self Care, 16109- Manual therapy, G0283- Electrical stimulation (unattended), 97016- Vasopneumatic device, Patient/Family education, Taping, Dry Needling, Joint mobilization, Cryotherapy, and Moist heat  PLAN FOR NEXT SESSION: pulleys, isometrics, AAROM, manual therapy, and modalities as needed   Deryl Flora, PTA 12/19/2023, 4:19 PM

## 2023-12-23 NOTE — Telephone Encounter (Signed)
 Traci Ellison [facility] [phone] 09/02/23 notes, note advising her restrictions/accommodations

## 2023-12-24 ENCOUNTER — Ambulatory Visit

## 2023-12-24 DIAGNOSIS — M25611 Stiffness of right shoulder, not elsewhere classified: Secondary | ICD-10-CM

## 2023-12-24 DIAGNOSIS — M25511 Pain in right shoulder: Secondary | ICD-10-CM | POA: Diagnosis not present

## 2023-12-24 DIAGNOSIS — G8929 Other chronic pain: Secondary | ICD-10-CM

## 2023-12-24 NOTE — Therapy (Addendum)
 OUTPATIENT PHYSICAL THERAPY SHOULDER TREATMENT   Patient Name: Traci Ellison MRN: [id number] DOB:Jun 18, 1986, 38 y.o., female Today's Date: 12/24/2023  END OF SESSION:  PT End of Session - 12/24/23 1513     Visit Number 7    Number of Visits 12    Date for PT Re-Evaluation 01/31/24    Authorization Time Period 12/25/23    Authorization - Number of Visits 8    PT Start Time 1510    Activity Tolerance Patient limited by pain    Behavior During Therapy [facility] for tasks assessed/performed             Past Medical History:  Diagnosis Date   Anxiety    8 months ago   Depression    8 months ago   Diabetes mellitus    type 1--since age 55   Diabetes mellitus type I (HCC)    Headache(784.0)    migraines, last 3 days ago-occurs occ.   History of kidney stones    x 1    Vaginal Pap smear, abnormal    Past Surgical History:  Procedure Laterality Date   CERVICAL BIOPSY  W/ LOOP ELECTRODE EXCISION     childbirth      x2 -last 3 months ago-[facility]   CYST REMOVAL TRUNK Left 04/03/2013   Procedure: EXCISION CHRONIC SEBACEOUS CYST LEFT UPPER CHEST WALL;  Surgeon: Rogena Class, MD;  Location: [facility];  Service: General;  Laterality: Left;   INDUCED ABORTION     Patient Active Problem List   Diagnosis Date Noted   Type 1 diabetes mellitus with hyperglycemia (HCC) 05/30/2023   Poorly controlled type 1 diabetes mellitus (HCC) 07/06/2021   Essential hypertension, benign 07/06/2021   Mixed hyperlipidemia 07/06/2021   Personal history of noncompliance with medical treatment, presenting hazards to health 03/20/2017   Current smoker 09/28/2016   Uncontrolled type 1 diabetes mellitus with complication 09/21/2016   Pre-existing type 1 diabetes mellitus in pregnancy in first trimester 05/13/2012   Sebaceous cyst 04/28/2012   REFERRING PROVIDER: Dodson Freestone, MD   REFERRING DIAG: Pain in right shoulder, Other enthesopathies, not elsewhere classified, Unspecified  rotator cuff tear or rupture of right shoulder, not specified as traumatic, Stiffness of right shoulder, not elsewhere classified   THERAPY DIAG:  Chronic right shoulder pain  Stiffness of right shoulder, not elsewhere classified  Rationale for Evaluation and Treatment: Rehabilitation  ONSET DATE: 2 years ago   SUBJECTIVE:  SUBJECTIVE STATEMENT: Pt reports 8/10 right shoulder pain today.  Pt reports having to lift boxes of oranges today at work.  Pt has MD appointment on Thursday.   Hand dominance: Right  PERTINENT HISTORY: Hypertension, diabetes type 1, current smoker, anxiety, and depression   PAIN:  Are you having pain? Yes: NPRS scale: 8/10 Pain location: right anterior and lateral shoulder Pain description: constant, sharp, shooting, tingling, aching, and dull  Aggravating factors: laying right side, quick motions, running, donning and doffing clothes, and reaching overhead Relieving factors: none  PRECAUTIONS: None  RED FLAGS: None   WEIGHT BEARING RESTRICTIONS: No  FALLS:  Has patient fallen in last 6 months? 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  PATIENT SURVEYS:  Quick Dash 81.82  COGNITION: Overall cognitive status: Within  functional limits for tasks assessed     SENSATION: Patient reports no numbness or tingling  UPPER EXTREMITY ROM: all right shoulder AROM measurements were limited by her familiar pain  Active ROM Right eval Left eval  Shoulder flexion 95 153  Shoulder extension    Shoulder abduction 93 159  Shoulder adduction    Shoulder internal rotation To sacrum To T7  Shoulder external rotation To right upper trapezius To T3  Elbow flexion    Elbow extension    Wrist flexion    Wrist extension    Wrist ulnar deviation    Wrist radial deviation    Wrist pronation    Wrist supination    (Blank rows = not tested)  UPPER EXTREMITY MMT: Right shoulder was not tested due to high pain irritability  MMT Right eval Left eval  Shoulder flexion  4/5  Shoulder extension    Shoulder abduction  4+/5  Shoulder adduction    Shoulder internal rotation  4+/5  Shoulder external rotation  4/5  Middle trapezius    Lower trapezius    Elbow flexion    Elbow extension    Wrist flexion    Wrist extension    Wrist ulnar deviation    Wrist radial deviation    Wrist pronation    Wrist supination    Grip strength (lbs) 20; shooting pain down the arm 30  (Blank rows = not tested)  SHOULDER SPECIAL TESTS: Unable to accurately assess due to high pain irritability  JOINT MOBILITY TESTING:  Unable to accurately assess due to pain  PALPATION:  Shoot pain reproduced with palpation to: right upper trapezius, periscapular muscular, and infraspinatus Familiar pain was reproduced with palpation to short head of the right biceps  TREATMENT DATE:   12/24/23                                  EXERCISE LOG  Exercise Repetitions and Resistance Comments  Pulleys 5  mins   UE Ranger Flex/ext; CW and CCW circles x 3 mins each   Wall Wash    IR stretch    ER stretch    Rows    Extensions     Therabar    AA Flexion     AA Chest Press    Ball Squeeze    Bicep Curls 3-way     Blank cell = exercise not performed today   Manual Therapy Soft Tissue Mobilization: right shoulder, STW/M and trigger point release to right anterior deltoid and long head of bicep tendon to decrease pain and tone.    Modalities  Date:  Unattended Estim: Shoulder, IFC 80-150 Hz, 15 mins, Pain and Tone Vaso: Shoulder, 34 degrees; low pressure, 15 mins, Pain   PATIENT EDUCATION: Education details: POC, prognosis, healing, objective findings, and goals for physical therapy Person educated: Patient Education method: Explanation Education comprehension: verbalized understanding  HOME EXERCISE PROGRAM: [email or web address]  Access Code: [email or web address]  ASSESSMENT:  CLINICAL IMPRESSION: Pt arrives for today's treatment session reporting 8/10 right shoulder pain.  Pt able to demonstrate 115 degrees of right shoulder flexion and 104 degrees of right shoulder abduction, both numbers decreasing since her goals were last checked.  Pt is able to carry 8 pound weight 54 ft with 4/10 pain, partially meeting her lifting goal.  Pt states that her pain rarely gets below a 6/10, especially after she has worked.  STW/M performed to right deltoid to decrease pain and tone with several trigger points palpated.  Normal responses to estim and vaso noted upon removal.  Pt encouraged to call the facility with any questions or concerns.  Pt plans to call the facility on Thursday after her MD appointment.  Pt reports 7/10 right shoulder pain at completion of today's treatment session.    12/24/23 PROGRESS REPORT:  Patient has made minimal progress with skilled physical therapy as evidenced by her subjective reports, objective measures, and functional mobility.  She was able to meet most of her short-term goals for physical therapy.  However, she continues to experience elevated pain levels which limit her active  range of motion and her ability to complete her critical job demands.  Recommend that she follow-up with her referring provider for additional medical assessment due to her continued high pain severity and irritability.  Glendora Landsman, PT, DPT   OBJECTIVE IMPAIRMENTS: decreased activity tolerance, decreased ROM, decreased strength, hypomobility, impaired tone, impaired UE functional use, and pain.   ACTIVITY LIMITATIONS: carrying, lifting, bathing, dressing, reach over head, and hygiene/grooming  PARTICIPATION LIMITATIONS: meal prep, cleaning, laundry, community activity, and occupation  PERSONAL FACTORS: Past/current experiences, Profession, Time since onset of injury/illness/exacerbation, and 3+ comorbidities: Hypertension, diabetes type 1, current smoker, anxiety, and depression  are also affecting patient's functional outcome.   REHAB POTENTIAL: Good  CLINICAL DECISION MAKING: Evolving/moderate complexity  EVALUATION COMPLEXITY: Moderate   GOALS: Goals reviewed with patient? Yes  SHORT TERM GOALS: Target date: 12/10/23  Patient will be independent with her initial HEP.  Baseline: Goal status: MET  2.  Patient will be able to complete her daily activities without her familiar pain exceeding 8/10. Baseline: 5/13: "as long as lifting is not involved" Goal status: PARTIALLY  MET  3.  Patient will be able to demonstrate at least 115 degrees of active right shoulder flexion for improved function reaching overhead.  Baseline: 5/13: 117 degrees Goal status:MET  4.  Patient will be able to demonstrate at least 115 degrees of active right shoulder abduction for improved function reaching overhead.  Baseline: 5/13: 118 degrees Goal status: MET  5.  Patient will improve her QuickDASH score to 65 or less for improved perceived function with her daily activities.  Baseline: 5/13: 52.3  Goal status: MET  LONG TERM GOALS: Target date: 12/31/23  Patient will be independent with her  advanced HEP.  Baseline:  Goal status: MET  2.  Patient will be able to complete her daily activities without her familiar pain exceeding 6/10. Baseline:  Goal status: IN PROGRESS  3.  Patient will be able to carry at least 8 pounds in her right upper extremity for improved function carrying her groceries.  Baseline: 5/20: can carry with 4/10 pain Goal status: PARTIALLY MET  4.  Patient will be able to demonstrate at least 125 degrees of active right shoulder flexion for improved function with her critical job demands.  Baseline: 5/20: 115 degrees Goal status: IN PROGRESS  5.  Patient will be able to demonstrate at least 125 degrees of active right shoulder abduction for improved function with overhead activities.  Baseline: 5/20: 104 degrees Goal status: IN PROGRESS  6.  Patient will improve her QuickDASH score to 50 or less for improved perceived function with her daily activities.  Baseline: 47.7  Goal status: MET  PLAN:  PT FREQUENCY: 2x/week  PT DURATION: 6 weeks  PLANNED INTERVENTIONS: 97164- PT Re-evaluation, 97750- Physical Performance Testing, 97110-Therapeutic exercises, 97530- Therapeutic activity, [id number]- Neuromuscular re-education, 97535- Self Care, 16109- Manual therapy, G0283- Electrical stimulation (unattended), 97016- Vasopneumatic device, Patient/Family education, Taping, Dry Needling, Joint mobilization, Cryotherapy, and Moist heat  PLAN FOR NEXT SESSION: pulleys, isometrics, AAROM, manual therapy, and modalities as needed   Deryl Flora, PTA 12/24/2023, 4:28 PM

## 2023-12-26 ENCOUNTER — Encounter

## 2023-12-31 ENCOUNTER — Encounter

## 2024-01-02 ENCOUNTER — Encounter

## 2024-01-11 ENCOUNTER — Other Ambulatory Visit: Payer: Self-pay | Admitting: Internal Medicine

## 2024-01-22 ENCOUNTER — Encounter: Payer: Self-pay | Admitting: Podiatry

## 2024-01-28 ENCOUNTER — Telehealth: Payer: Self-pay

## 2024-01-28 NOTE — Telephone Encounter (Signed)
 Spoke with patient and advised that clearance paperwork has not been received and to refax

## 2024-02-03 ENCOUNTER — Encounter: Payer: Self-pay | Admitting: Internal Medicine

## 2024-02-06 ENCOUNTER — Ambulatory Visit (INDEPENDENT_AMBULATORY_CARE_PROVIDER_SITE_OTHER): Payer: Medicaid Other | Admitting: Internal Medicine

## 2024-02-06 ENCOUNTER — Encounter: Payer: Self-pay | Admitting: Internal Medicine

## 2024-02-06 VITALS — BP 118/66 | HR 85 | Ht 60.75 in | Wt 107.0 lb

## 2024-02-06 DIAGNOSIS — E782 Mixed hyperlipidemia: Secondary | ICD-10-CM | POA: Diagnosis not present

## 2024-02-06 DIAGNOSIS — E1065 Type 1 diabetes mellitus with hyperglycemia: Secondary | ICD-10-CM

## 2024-02-06 DIAGNOSIS — E109 Type 1 diabetes mellitus without complications: Secondary | ICD-10-CM | POA: Insufficient documentation

## 2024-02-06 LAB — POCT GLYCOSYLATED HEMOGLOBIN (HGB A1C): Hemoglobin A1C: 6.9 % — AB (ref 4.0–5.6)

## 2024-02-06 NOTE — Patient Instructions (Signed)
 Enter 4 g with meals, 2 g with snacks    HOW TO TREAT LOW BLOOD SUGARS (Blood sugar LESS THAN 70 MG/DL) Please follow the RULE OF 15 for the treatment of hypoglycemia treatment (when your (blood sugars are less than 70 mg/dL)   STEP 1: Take 15 grams of carbohydrates when your blood sugar is low, which includes:  3-4 GLUCOSE TABS  OR 3-4 OZ OF JUICE OR REGULAR SODA OR ONE TUBE OF GLUCOSE GEL    STEP 2: RECHECK blood sugar in 15 MINUTES STEP 3: If your blood sugar is still low at the 15 minute recheck --> then, go back to STEP 1 and treat AGAIN with another 15 grams of carbohydrates.

## 2024-02-06 NOTE — Progress Notes (Signed)
 Name: Traci Ellison  MRN/ DOB: [id number], [birth year]   Age/ Sex: 38 y.o., female    PCP: Kotturi, Vinay K, MD   Reason for Endocrinology Evaluation: Type 1 Diabetes Mellitus     Date of Initial Endocrinology Visit: 05/30/2023    PATIENT IDENTIFIER: Ms. Traci Ellison is a 38 y.o. female with a past medical history of DM. The patient presented for initial endocrinology clinic visit on 05/30/2023 for consultative assistance with her diabetes management.    HPI: Traci Ellison was    Diagnosed with DM at age 66 Prior Medications tried/Intolerance: Used to be on the Medtronic pump as a child            Hemoglobin A1c has ranged from 9.8% in 2023, peaking at 11.2% in 2022.  On her initial visit to our clinic she had an A1c of 9.6%, I adjusted MDI regimen and provided her with a correction scale.  I also referred her to our CDE for carb counting and a prescription of Dexcom was sent to the pharmacy    She was started on the OmniPod 07/2023  SUBJECTIVE:   During the last visit (10/04/2023): A1c 7.2%     Today (02/06/24): Traci Ellison is here for follow-up on diabetes management.  She checks blood sugars multiple times daily. The patient has  had hypoglycemic episodes since the last clinic visit. The patient is  symptomatic with these episodes.   Patient denies any nausea or vomiting Patient  denies constipation or  diarrhea She is scheduled to have right shoulder surgery  This patient with type 1 diabetes is treated with OmniPod (insulin  pump). During the visit the pump basal and bolus doses were reviewed including carb/insulin  rations and supplemental doses. The clinical list was updated. The glucose meter download was reviewed in detail to determine if the current pump settings are providing the best glycemic control without excessive hypoglycemia.  Pump and meter download:    Pump   OmniPod Settings   Insulin  type   NovoLog    Basal rate       0000 1.6u/h                I:C ratio       0000 1:1    Enter #4g  TID              Sensitivity       0000  40      Goal       0000  120             Type & Model of Pump: OmniPod Insulin  Type: Currently using NovoLog .  Body mass index is 20.38 kg/m.  PUMP STATISTICS: Average BG: 121 Average Daily Carbs (g): 0  Average Total Daily Insulin : 10.6 Average Daily Basal: 10.2 (96 %) Average Daily Bolus: 0.4 (4 %)      HOME DIABETES REGIMEN: NovoLog   Statin: yes ACE-I/ARB: no   CONTINUOUS GLUCOSE MONITORING RECORD INTERPRETATION    Dates of Recording: 2/15 - 10/04/2023  Sensor description: Dexcom  Results statistics:   CGM use % of time 67.2  Average and SD 121/51  Time in range      69%  % Time Above 180 15  % Time above 250 1  % Time Below target 6   Glycemic patterns summary: BGs are mostly optimal throughout the day and night  Hyperglycemic episodes postprandial  Hypoglycemic episodes occurred through the day and night  Overnight periods: Variable but mostly  low    DIABETIC COMPLICATIONS: Microvascular complications:   Denies: DR, retinopathy, neuropathy  Last eye exam: Completed 07/2022  Macrovascular complications:   Denies: CAD, PVD, CVA   PAST HISTORY: Past Medical History:  Past Medical History:  Diagnosis Date   Anxiety    8 months ago   Depression    8 months ago   Diabetes mellitus    type 1--since age 19   Diabetes mellitus type I (HCC)    Headache(784.0)    migraines, last 3 days ago-occurs occ.   History of kidney stones    x 1    Vaginal Pap smear, abnormal    Past Surgical History:  Past Surgical History:  Procedure Laterality Date   CERVICAL BIOPSY  W/ LOOP ELECTRODE EXCISION     childbirth      x2 -last 3 months ago-[facility]   CYST REMOVAL TRUNK Left 04/03/2013   Procedure: EXCISION CHRONIC SEBACEOUS CYST LEFT UPPER CHEST WALL;  Surgeon: Vicenta DELENA Poli, MD;  Location: [facility];  Service: General;  Laterality: Left;    INDUCED ABORTION      Social History:  reports that she has been smoking cigarettes. She has a 0.3 pack-year smoking history. She has never used smokeless tobacco. She reports that she does not drink alcohol and does not use drugs. Family History:  Family History  Problem Relation Age of Onset   Hypertension Mother    Diabetes Mother    Heart disease Mother    Stroke Mother      HOME MEDICATIONS: Allergies as of 02/06/2024       Reactions   Diclofenac Hives, Itching   Nsaids    Methocarbamol Dermatitis        Medication List        Accurate as of February 06, 2024 11:37 AM. If you have any questions, ask your nurse or doctor.          Accu-Chek Guide Test test strip Generic drug: glucose blood USE AS INSTRUCTED AT LEAST 7X DAILY   atorvastatin  10 MG tablet Commonly known as: LIPITOR Take 1 tablet (10 mg total) by mouth at bedtime.   BD Pen Needle Nano U/F 32G X 4 MM Misc Generic drug: Insulin  Pen Needle 1 each by Does not apply route 4 (four) times daily.   busPIRone 5 MG tablet Commonly known as: BUSPAR Take 10 mg by mouth 3 (three) times daily.   Dexcom G6 Transmitter Misc 1 Piece by Does not apply route as directed.   Dexcom G7 Sensor Misc 1 Device by Does not apply route as directed.   FLUoxetine 20 MG capsule Commonly known as: PROZAC Take 1 tablet by mouth 2 (two) times daily.   FLUoxetine 10 MG capsule Commonly known as: PROZAC Take 20 mg by mouth daily.   gabapentin  300 MG capsule Commonly known as: NEURONTIN  Take 1 capsule (300 mg total) by mouth at bedtime.   Lantus  SoloStar 100 UNIT/ML Solostar Pen Generic drug: insulin  glargine Inject 30 Units into the skin at bedtime.   levonorgestrel 20 MCG/DAY Iud Commonly known as: MIRENA by Intrauterine route.   lisinopril 2.5 MG tablet Commonly known as: ZESTRIL Take 1 tablet by mouth daily.   Microlet Lancets Misc Use to test blood glucose up to 7 times a day.   NovoLOG  FlexPen 100  UNIT/ML FlexPen Generic drug: insulin  aspart Inject 6-9 Units into the skin 3 (three) times daily with meals. Max daily 30 units daily   insulin  aspart  100 UNIT/ML injection Commonly known as: novoLOG  MAX DAILY DOSE 65 UNITS INSULIN  PUMP   Omnipod 5 G7 Intro (Gen 5) Kit 1 Device by Does not apply route every other day.   Omnipod 5 G7 Pods (Gen 5) Misc 1 Device by Does not apply route every 3 (three) days.   Restasis 0.05 % ophthalmic emulsion Generic drug: cycloSPORINE 1 drop 2 (two) times daily.   ZANTAC PO Take by mouth as needed.         ALLERGIES: Allergies  Allergen Reactions   Diclofenac Hives and Itching   Nsaids    Methocarbamol Dermatitis     REVIEW OF SYSTEMS: A comprehensive ROS was conducted with the patient and is negative except as per HPI and    OBJECTIVE:   VITAL SIGNS: BP 118/66 (BP Location: Left Arm, Patient Position: Sitting, Cuff Size: Small)   Pulse 85   Ht 5' 0.75 (1.543 m)   Wt 107 lb (48.5 kg)   SpO2 99%   BMI 20.38 kg/m    PHYSICAL EXAM:  General: Pt appears well and is in NAD  Neck: General: Supple without adenopathy or carotid bruits. Thyroid : Thyroid  size normal.  No goiter or nodules appreciated.   Lungs: Clear with good BS bilat   Heart: RRR   Abdomen:  soft, nontender  Extremities:  Lower extremities - No pretibial edema.   Neuro: MS is good with appropriate affect, pt is alert and Ox3    DM foot exam: 05/30/2023  The skin of the feet is intact without sores or ulcerations. The pedal pulses are 2+ on right and 2+ on left. The sensation is intact to a screening 5.07, 10 gram monofilament bilaterally   DATA REVIEWED:  Lab Results  Component Value Date   HGBA1C 7.2 (A) 10/04/2023   HGBA1C 9.6 (A) 05/30/2023   HGBA1C 9.8 (A) 11/09/2021   Labs @ [facility]  12/17/2023   A1c 7.9% Triglycerides 30 LDL 88 HDL 61 Glucose 304 BUN 16 Creatinine 0.84 GFR > 90     ASSESSMENT / PLAN / RECOMMENDATIONS:    1) Type 1 Diabetes Mellitus, Optimally  controlled, Without  complications - Most recent A1c of 6.9%. Goal A1c < 7.0 %.     -A1c is optimal -During switching OmniPod from PDM to the phone somehow her settings have changed and she is on a lot more basal insulin  and the sensitivity factor has changed.  The patient has been noted with hypoglycemia during the day, will decrease basal rate -She continues not to bolus on a regular basis, we again discussed the importance of bolusing between 2-4 g before she eats -Patient advised to avoid sugar sweetened beverages   MEDICATIONS: NovoLog      Pump   OmniPod Settings   Insulin  type   NovoLog    Basal rate       0000 1.55 u/h           I:C ratio       0000 1:1    Enter #4 g with meals/2g with snack          Sensitivity       0000  40      Goal       0000  120         EDUCATION / INSTRUCTIONS: BG monitoring instructions: Patient is instructed to check her blood sugars 3 times a day. Call [facility] if: BG persistently < 70  I reviewed the Rule of 15 for the  treatment of hypoglycemia in detail with the patient. Literature supplied.   2) Diabetic complications:  Eye: Does not have known diabetic retinopathy.  Neuro/ Feet: Does not have known diabetic peripheral neuropathy. Renal: Patient does not have known baseline CKD. She is not on an ACEI/ARB at present.  3) Dyslipidemia :  -Lipid panel acceptable 12/2023  Medication Continue atorvastatin  10 mg daily     Follow-up in 6 months  Signed electronically by: Stefano Redgie Butts, MD  [facility]  [facility] [address] [place] [place], [place] [address] Phone: [phone] FAX: [phone]   CC: Kotturi, Vinay K, MD [address] [place] [address] Phone: [phone]  Fax: [phone]    Return to Endocrinology clinic as below: No future appointments.

## 2024-02-26 ENCOUNTER — Ambulatory Visit: Attending: Orthopedic Surgery

## 2024-02-26 DIAGNOSIS — M25611 Stiffness of right shoulder, not elsewhere classified: Secondary | ICD-10-CM | POA: Insufficient documentation

## 2024-02-26 DIAGNOSIS — M25511 Pain in right shoulder: Secondary | ICD-10-CM | POA: Insufficient documentation

## 2024-02-26 NOTE — Therapy (Signed)
 OUTPATIENT PHYSICAL THERAPY SHOULDER EVALUATION   Patient Name: Traci Ellison MRN: [id number] DOB:1985/12/22, 38 y.o., female Today's Date: 02/26/2024  END OF SESSION:  PT End of Session - 02/26/24 0848     Visit Number 1    Number of Visits 14    Date for PT Re-Evaluation 05/01/24    PT Start Time 0849    PT Stop Time 0930    PT Time Calculation (min) 41 min    Activity Tolerance Patient limited by pain    Behavior During Therapy [facility] for tasks assessed/performed          Past Medical History:  Diagnosis Date   Anxiety    8 months ago   Depression    8 months ago   Diabetes mellitus    type 1--since age 18   Diabetes mellitus type I (HCC)    Headache(784.0)    migraines, last 3 days ago-occurs occ.   History of kidney stones    x 1    Vaginal Pap smear, abnormal    Past Surgical History:  Procedure Laterality Date   CERVICAL BIOPSY  W/ LOOP ELECTRODE EXCISION     childbirth      x2 -last 3 months ago-[facility]   CYST REMOVAL TRUNK Left 04/03/2013   Procedure: EXCISION CHRONIC SEBACEOUS CYST LEFT UPPER CHEST WALL;  Surgeon: Vicenta DELENA Poli, MD;  Location: [facility];  Service: General;  Laterality: Left;   INDUCED ABORTION     Patient Active Problem List   Diagnosis Date Noted   Type 1 diabetes mellitus without complication (HCC) 02/06/2024   Type 1 diabetes mellitus with hyperglycemia (HCC) 05/30/2023   Poorly controlled type 1 diabetes mellitus (HCC) 07/06/2021   Essential hypertension, benign 07/06/2021   Mixed hyperlipidemia 07/06/2021   Personal history of noncompliance with medical treatment, presenting hazards to health 03/20/2017   Current smoker 09/28/2016   Uncontrolled type 1 diabetes mellitus with complication 09/21/2016   Pre-existing type 1 diabetes mellitus in pregnancy in first trimester 05/13/2012   Sebaceous cyst 04/28/2012   REFERRING PROVIDER: Heyward Pastor, MD  REFERRING DIAG: Rotator cuff syndrome of right shoulder,  shoulder joint stiffness, right, biceps tendonitis of right shoulder  THERAPY DIAG:  Acute pain of right shoulder  Stiffness of right shoulder, not elsewhere classified  Rationale for Evaluation and Treatment: Rehabilitation  ONSET DATE: 02/25/24  SUBJECTIVE:                                                                                                                                                                                      SUBJECTIVE STATEMENT: Patient reports that she had right shoulder surgery  on 02/25/24. She notes that she has cried about 14 times since surgery due to pain. She has been using her ice machine for pain. She has not worn her sling since early this morning. She has begun to try to move and use her arm which really hurts. She was told that she could use her sling for comfort and at night.  Hand dominance: Right  PERTINENT HISTORY: Hypertension, type 1 diabetes, current smoker anxiety, and depression  PAIN:  Are you having pain? Yes: NPRS scale: Current: 8/10 Best: 8/10 Worst: 20/10 Pain location: right shoulder, but can radiate down her arm Pain description: tingling, sharp, stabbing,  Aggravating factors: moving her arm Relieving factors: none  PRECAUTIONS: Shoulder; no lifting with RUE  RED FLAGS: None   WEIGHT BEARING RESTRICTIONS: Yes NWB through RUE  FALLS:  Has patient fallen in last 6 months? Yes. 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  PATIENT SURVEYS:  Quick Dash:  QUICK DASH  Please rate your ability do the following  activities in the last week by selecting the number below the appropriate response.   Activities Rating  Open a tight or new jar.  4 = Severe difficulty  Do heavy household chores (e.g., wash walls, floors). 4 = Severe difficulty  Carry a shopping bag or briefcase 4 = Severe difficulty  Wash your back. 5 = Unable  Use a knife to cut food. 5 = Unable  Recreational activities in which you take some force or impact through your arm, shoulder or hand (e.g., golf, hammering, tennis, etc.). 5 = Unable  During the past week, to what extent has your arm, shoulder or hand problem interfered with your normal social activities with family, friends, neighbors or groups?  4 = Quite a bit  During the past week, were you limited in your work or other regular daily activities as a result of your arm, shoulder or hand problem? 4 = Very limited  Rate the severity of the following symptoms in the last week: Arm, Shoulder, or hand pain. 4 = Severe  Rate the severity of the following symptoms in the last week: Tingling (pins and needles) in your arm, shoulder or hand. 4 = Severe  During the past week, how much difficulty have you had sleeping because of the pain in your arm, shoulder or hand?  5 = So much difficulty that I can't sleep   (A QuickDASH score may not be calculated if there is greater than 1 missing item.)  Quick Dash Disability/Symptom Score: [(sum of 48 (n) responses/11 (n)] x 25 = 84.09  Minimally Clinically Important Difference (MCID): 15-20 points  (Franchignoni, F. et al. (2013). Minimally clinically important difference of the disabilities of the arm, shoulder, and hand outcome measures (DASH) and its shortened version (Quick DASH). Journal of Orthopaedic & Sports Physical Therapy, 44(1), 30-39)   COGNITION: Overall cognitive status: Within functional limits for tasks assessed     SENSATION: Patient reports intermittent right shoulder numbness and tingling in her right shoulder and hand  since surgery.   UPPER EXTREMITY ROM:   Active ROM Right Eval (PROM)  Left eval  Shoulder flexion 70 145  Shoulder extension    Shoulder abduction 30 131  Shoulder adduction    Shoulder internal rotation 60 To T6  Shoulder external rotation -10 To T3  Elbow flexion    Elbow extension    Wrist flexion    Wrist extension    Wrist ulnar deviation    Wrist radial deviation    Wrist pronation    Wrist supination    (Blank rows = not tested)  UPPER EXTREMITY MMT: not tested due to surgical condition  SHOULDER SPECIAL TESTS: Not tested due to surgical condition  JOINT MOBILITY TESTING:  Not tested due to high pain severity and irritability  PALPATION:  Global right shoulder and UE tenderness to palpation                                                                                                                             TREATMENT DATE:   PATIENT EDUCATION: Education details: Healing, prognosis, plan of care, HEP, and goals for physical therapy Person educated: Patient Education method: Explanation Education comprehension: verbalized understanding  HOME EXERCISE PROGRAM: [id number]  ASSESSMENT:  CLINICAL IMPRESSION: Patient is a 38 y.o. female who was seen today for physical therapy evaluation and treatment following a right shoulder arthroscopy on 02/25/24.  She presented with high pain severity and irritability with all palpation and passive range of motion aggravating her familiar symptoms. She was provided a HEP which she was able to properly demonstrate. She reported feeling comfortable completing these interventions at home.  Recommend that she continue with skilled physical therapy to address her impairments to return to her prior level of function.  OBJECTIVE IMPAIRMENTS: decreased activity tolerance, decreased ROM, decreased strength, hypomobility, impaired sensation, impaired tone, impaired UE functional use, and pain.   ACTIVITY LIMITATIONS: carrying,  lifting, sleeping, dressing, reach over head, and hygiene/grooming  PARTICIPATION LIMITATIONS: meal prep, cleaning, laundry, community activity, and occupation  PERSONAL FACTORS: Past/current experiences and 3+ comorbidities: Hypertension, type 1 diabetes, current smoker anxiety, and depression are also affecting patient's functional outcome.   REHAB POTENTIAL: Good  CLINICAL DECISION MAKING: Evolving/moderate complexity  EVALUATION COMPLEXITY: Moderate   GOALS: Goals reviewed with patient? No  SHORT TERM GOALS: Target date: 03/25/24  Patient will be independent with her initial HEP.  Baseline: Goal status: INITIAL  2.  Patient will be able to demonstrate at least 120 degrees of passive right shoulder flexion for improved shoulder mobility. Baseline:  Goal status: INITIAL  3.  Patient will report being able to type on her computer without being limited by her familiar right shoulder symptoms for improved function with her daily activities. Baseline:  Goal status: INITIAL  4.  Patient will improve her QuickDASH score to 60 or less for improved perceived function with her daily activities. Baseline:  Goal status: INITIAL  LONG TERM GOALS: Target date: 04/15/24  Patient will be independent with her advanced HEP.  Baseline:  Goal status: INITIAL  2.  Patient will be able to demonstrate at least 120 degrees of active right shoulder flexion for improved function reaching overhead. Baseline:  Goal status: INITIAL  3.  Patient will be able to demonstrate at least 120 degrees of active right shoulder abduction for improved function reaching overhead. Baseline:  Goal status: INITIAL  4.  Patient will be able to carry at least 8 pounds with her right upper extremity for improved function carrying her groceries. Baseline:  Goal status: INITIAL  5.  Patient will be able to complete her critical job demands without being limited by her familiar right shoulder symptoms. Baseline:   Goal status: INITIAL  6.  Patient will improve her QuickDASH score to 40 or less for improved perceived function with her daily activities. Baseline:  Goal status: INITIAL  PLAN:  PT FREQUENCY: 2-3x/week  PT DURATION: other: 7 weeks  PLANNED INTERVENTIONS: 97164- PT Re-evaluation, 97750- Physical Performance Testing, 97110-Therapeutic exercises, 97530- Therapeutic activity, [id number]- Neuromuscular re-education, 97535- Self Care, [id number]- Manual therapy, [id number]- Electrical stimulation (unattended), 97016- Vasopneumatic device, Patient/Family education, Joint mobilization, Cryotherapy, and Moist heat  PLAN FOR NEXT SESSION: manual therapy, isometrics, AAROM, and modalities as needed   Lacinda JAYSON Fass, PT 02/26/2024, 12:34 PM

## 2024-03-12 ENCOUNTER — Ambulatory Visit: Attending: Orthopedic Surgery

## 2024-03-12 DIAGNOSIS — G8929 Other chronic pain: Secondary | ICD-10-CM | POA: Diagnosis present

## 2024-03-12 DIAGNOSIS — M25511 Pain in right shoulder: Secondary | ICD-10-CM | POA: Diagnosis present

## 2024-03-12 DIAGNOSIS — M25611 Stiffness of right shoulder, not elsewhere classified: Secondary | ICD-10-CM | POA: Insufficient documentation

## 2024-03-12 NOTE — Therapy (Signed)
 OUTPATIENT PHYSICAL THERAPY SHOULDER TREATMENT   Patient Name: Traci Ellison MRN: [id number] DOB:09/14/1985, 38 y.o., female Today's Date: 03/12/2024  END OF SESSION:  PT End of Session - 03/12/24 1302     Visit Number 2    Number of Visits 14    Date for PT Re-Evaluation 05/01/24    PT Start Time 1300    PT Stop Time 1348    PT Time Calculation (min) 48 min    Activity Tolerance Patient limited by pain    Behavior During Therapy [facility] for tasks assessed/performed           Past Medical History:  Diagnosis Date   Anxiety    8 months ago   Depression    8 months ago   Diabetes mellitus    type 1--since age 58   Diabetes mellitus type I (HCC)    Headache(784.0)    migraines, last 3 days ago-occurs occ.   History of kidney stones    x 1    Vaginal Pap smear, abnormal    Past Surgical History:  Procedure Laterality Date   CERVICAL BIOPSY  W/ LOOP ELECTRODE EXCISION     childbirth      x2 -last 3 months ago-[facility]   CYST REMOVAL TRUNK Left 04/03/2013   Procedure: EXCISION CHRONIC SEBACEOUS CYST LEFT UPPER CHEST WALL;  Surgeon: Vicenta DELENA Poli, MD;  Location: [facility];  Service: General;  Laterality: Left;   INDUCED ABORTION     Patient Active Problem List   Diagnosis Date Noted   Type 1 diabetes mellitus without complication (HCC) 02/06/2024   Type 1 diabetes mellitus with hyperglycemia (HCC) 05/30/2023   Poorly controlled type 1 diabetes mellitus (HCC) 07/06/2021   Essential hypertension, benign 07/06/2021   Mixed hyperlipidemia 07/06/2021   Personal history of noncompliance with medical treatment, presenting hazards to health 03/20/2017   Current smoker 09/28/2016   Uncontrolled type 1 diabetes mellitus with complication 09/21/2016   Pre-existing type 1 diabetes mellitus in pregnancy in first trimester 05/13/2012   Sebaceous cyst 04/28/2012   REFERRING PROVIDER: Heyward Pastor, MD  REFERRING DIAG: Rotator cuff syndrome of right shoulder,  shoulder joint stiffness, right, biceps tendonitis of right shoulder  THERAPY DIAG:  Acute pain of right shoulder  Stiffness of right shoulder, not elsewhere classified  Rationale for Evaluation and Treatment: Rehabilitation  ONSET DATE: 02/25/24  SUBJECTIVE:                                                                                                                                                                                      SUBJECTIVE STATEMENT: Patient reports that she is not taking  pain medication any more. She is now able to get dressed on her own. She returns to work on 03/23/24. She has noticed that can reach behind her back a little better since surgery. She is now able to pick up light weights.  Hand dominance: Right  PERTINENT HISTORY: Hypertension, type 1 diabetes, current smoker anxiety, and depression  PAIN:  Are you having pain? Yes: NPRS scale: Current: 2/10 Best: 8/10 Worst: 20/10 Pain location: right shoulder, but can radiate down her arm Pain description: tingling, sharp, stabbing,  Aggravating factors: moving her arm Relieving factors: none  PRECAUTIONS: Shoulder; no lifting with RUE  RED FLAGS: None   WEIGHT BEARING RESTRICTIONS: Yes NWB through RUE  FALLS:  Has patient fallen in last 6 months? Yes. 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  PATIENT SURVEYS:  Quick Dash:  QUICK DASH  Please rate your ability do the following activities in the last week by selecting the number below the appropriate response.   Activities Rating  Open  a tight or new jar.  4 = Severe difficulty  Do heavy household chores (e.g., wash walls, floors). 4 = Severe difficulty  Carry a shopping bag or briefcase 4 = Severe difficulty  Wash your back. 5 = Unable  Use a knife to cut food. 5 = Unable  Recreational activities in which you take some force or impact through your arm, shoulder or hand (e.g., golf, hammering, tennis, etc.). 5 = Unable  During the past week, to what extent has your arm, shoulder or hand problem interfered with your normal social activities with family, friends, neighbors or groups?  4 = Quite a bit  During the past week, were you limited in your work or other regular daily activities as a result of your arm, shoulder or hand problem? 4 = Very limited  Rate the severity of the following symptoms in the last week: Arm, Shoulder, or hand pain. 4 = Severe  Rate the severity of the following symptoms in the last week: Tingling (pins and needles) in your arm, shoulder or hand. 4 = Severe  During the past week, how much difficulty have you had sleeping because of the pain in your arm, shoulder or hand?  5 = So much difficulty that I can't sleep   (A QuickDASH score may not be calculated if there is greater than 1 missing item.)  Quick Dash Disability/Symptom Score: [(sum of 48 (n) responses/11 (n)] x 25 = 84.09  Minimally Clinically Important Difference (MCID): 15-20 points  (Franchignoni, F. et al. (2013). Minimally clinically important difference of the disabilities of the arm, shoulder, and hand outcome measures (DASH) and its shortened version (Quick DASH). Journal of Orthopaedic & Sports Physical Therapy, 44(1), 30-39)   COGNITION: Overall cognitive status: Within functional limits for tasks assessed     SENSATION: Patient reports intermittent right shoulder numbness and tingling in her right shoulder and hand since surgery.   UPPER EXTREMITY ROM:   Active ROM Right Eval (PROM) Left eval  Shoulder flexion 70 145   Shoulder extension    Shoulder abduction 30 131  Shoulder adduction  Shoulder internal rotation 60 To T6  Shoulder external rotation -10 To T3  Elbow flexion    Elbow extension    Wrist flexion    Wrist extension    Wrist ulnar deviation    Wrist radial deviation    Wrist pronation    Wrist supination    (Blank rows = not tested)  UPPER EXTREMITY MMT: not tested due to surgical condition  SHOULDER SPECIAL TESTS: Not tested due to surgical condition  JOINT MOBILITY TESTING:  Not tested due to high pain severity and irritability  PALPATION:  Global right shoulder and UE tenderness to palpation                                                                                                                             TREATMENT DATE:                                    03/12/24 EXERCISE LOG  Exercise Repetitions and Resistance Comments  Pulleys 6 minutes  Flexion   UE ranger (seated) 4 minutes Multidirectional   Scapular retraction 20 reps    Manual therapy See below        Blank cell = exercise not performed today  Manual Therapy Soft Tissue Mobilization: right supraspinatus, infraspinatus, deltoid, and subscapularis, for reduced pain and improved soft tissue extensibility Joint Mobilizations: right glenohumeral, posterior and inferior grade I-II Passive ROM: right shoulder flexion, ABD, and ER, to tolerance  Modalities: no redness or adverse reaction to today's modalities  Date:  Vaso: Shoulder, 34 degrees; low pressure, 15 mins, Pain and Tone  PATIENT EDUCATION: Education details: healing Person educated: Patient Education method: Explanation Education comprehension: verbalized understanding  HOME EXERCISE PROGRAM: [id number]  ASSESSMENT:  CLINICAL IMPRESSION: Patient was introduced to multiple new interventions for improved passive and active assisted right shoulder range of motion. She required minimal cueing with scapular retractions for proper exercise  performance to facilitate periscapular engagement. Pain was her primary limiting factor with today's interventions. She reported feeling alright upon the conclusion of treatment. She continues to require skilled physical therapy to address her remaining impairments to return to her prior level of function.   OBJECTIVE IMPAIRMENTS: decreased activity tolerance, decreased ROM, decreased strength, hypomobility, impaired sensation, impaired tone, impaired UE functional use, and pain.   ACTIVITY LIMITATIONS: carrying, lifting, sleeping, dressing, reach over head, and hygiene/grooming  PARTICIPATION LIMITATIONS: meal prep, cleaning, laundry, community activity, and occupation  PERSONAL FACTORS: Past/current experiences and 3+ comorbidities: Hypertension, type 1 diabetes, current smoker anxiety, and depression are also affecting patient's functional outcome.   REHAB POTENTIAL: Good  CLINICAL DECISION MAKING: Evolving/moderate complexity  EVALUATION COMPLEXITY: Moderate   GOALS: Goals reviewed with patient? No  SHORT TERM GOALS: Target date: 03/25/24  Patient will be independent with her initial HEP.  Baseline: Goal status: INITIAL  2.  Patient will be able to demonstrate  at least 120 degrees of passive right shoulder flexion for improved shoulder mobility. Baseline:  Goal status: INITIAL  3.  Patient will report being able to type on her computer without being limited by her familiar right shoulder symptoms for improved function with her daily activities. Baseline:  Goal status: INITIAL  4.  Patient will improve her QuickDASH score to 60 or less for improved perceived function with her daily activities. Baseline:  Goal status: INITIAL  LONG TERM GOALS: Target date: 04/15/24  Patient will be independent with her advanced HEP.  Baseline:  Goal status: INITIAL  2.  Patient will be able to demonstrate at least 120 degrees of active right shoulder flexion for improved function reaching  overhead. Baseline:  Goal status: INITIAL  3.  Patient will be able to demonstrate at least 120 degrees of active right shoulder abduction for improved function reaching overhead. Baseline:  Goal status: INITIAL  4.  Patient will be able to carry at least 8 pounds with her right upper extremity for improved function carrying her groceries. Baseline:  Goal status: INITIAL  5.  Patient will be able to complete her critical job demands without being limited by her familiar right shoulder symptoms. Baseline:  Goal status: INITIAL  6.  Patient will improve her QuickDASH score to 40 or less for improved perceived function with her daily activities. Baseline:  Goal status: INITIAL  PLAN:  PT FREQUENCY: 2-3x/week  PT DURATION: other: 7 weeks  PLANNED INTERVENTIONS: 97164- PT Re-evaluation, 97750- Physical Performance Testing, 97110-Therapeutic exercises, 97530- Therapeutic activity, [id number]- Neuromuscular re-education, 97535- Self Care, [id number]- Manual therapy, [id number]- Electrical stimulation (unattended), 97016- Vasopneumatic device, Patient/Family education, Joint mobilization, Cryotherapy, and Moist heat  PLAN FOR NEXT SESSION: manual therapy, isometrics, AAROM, and modalities as needed   Lacinda JAYSON Fass, PT 03/12/2024, 2:33 PM

## 2024-03-13 ENCOUNTER — Ambulatory Visit

## 2024-03-13 DIAGNOSIS — M25511 Pain in right shoulder: Secondary | ICD-10-CM

## 2024-03-13 DIAGNOSIS — G8929 Other chronic pain: Secondary | ICD-10-CM

## 2024-03-13 DIAGNOSIS — M25611 Stiffness of right shoulder, not elsewhere classified: Secondary | ICD-10-CM

## 2024-03-13 NOTE — Therapy (Signed)
 OUTPATIENT PHYSICAL THERAPY SHOULDER TREATMENT   Patient Name: Traci Ellison MRN: [id number] DOB:1985-08-11, 38 y.o., female Today's Date: 03/13/2024  END OF SESSION:  PT End of Session - 03/13/24 0849     Visit Number 3    Number of Visits 14    Date for PT Re-Evaluation 05/01/24    PT Start Time 0845    PT Stop Time 0946    PT Time Calculation (min) 61 min    Activity Tolerance Patient limited by pain    Behavior During Therapy [facility] for tasks assessed/performed           Past Medical History:  Diagnosis Date   Anxiety    8 months ago   Depression    8 months ago   Diabetes mellitus    type 1--since age 25   Diabetes mellitus type I (HCC)    Headache(784.0)    migraines, last 3 days ago-occurs occ.   History of kidney stones    x 1    Vaginal Pap smear, abnormal    Past Surgical History:  Procedure Laterality Date   CERVICAL BIOPSY  W/ LOOP ELECTRODE EXCISION     childbirth      x2 -last 3 months ago-[facility]   CYST REMOVAL TRUNK Left 04/03/2013   Procedure: EXCISION CHRONIC SEBACEOUS CYST LEFT UPPER CHEST WALL;  Surgeon: Vicenta DELENA Poli, MD;  Location: [facility];  Service: General;  Laterality: Left;   INDUCED ABORTION     Patient Active Problem List   Diagnosis Date Noted   Type 1 diabetes mellitus without complication (HCC) 02/06/2024   Type 1 diabetes mellitus with hyperglycemia (HCC) 05/30/2023   Poorly controlled type 1 diabetes mellitus (HCC) 07/06/2021   Essential hypertension, benign 07/06/2021   Mixed hyperlipidemia 07/06/2021   Personal history of noncompliance with medical treatment, presenting hazards to health 03/20/2017   Current smoker 09/28/2016   Uncontrolled type 1 diabetes mellitus with complication 09/21/2016   Pre-existing type 1 diabetes mellitus in pregnancy in first trimester 05/13/2012   Sebaceous cyst 04/28/2012   REFERRING PROVIDER: Heyward Pastor, MD  REFERRING DIAG: Rotator cuff syndrome of right shoulder,  shoulder joint stiffness, right, biceps tendonitis of right shoulder  THERAPY DIAG:  Acute pain of right shoulder  Stiffness of right shoulder, not elsewhere classified  Chronic right shoulder pain  Rationale for Evaluation and Treatment: Rehabilitation  ONSET DATE: 02/25/24  SUBJECTIVE:                                                                                                                                                                                      SUBJECTIVE STATEMENT: Pt reports  6/10 right shoulder pain today.  Pt states that she had to take a pain pill last night.   Hand dominance: Right  PERTINENT HISTORY: Hypertension, type 1 diabetes, current smoker anxiety, and depression  PAIN:  Are you having pain? Yes: NPRS scale: 6/10 Pain location: right shoulder, but can radiate down her arm Pain description: tingling, sharp, stabbing,  Aggravating factors: moving her arm Relieving factors: none  PRECAUTIONS: Shoulder; no lifting with RUE  RED FLAGS: None   WEIGHT BEARING RESTRICTIONS: Yes NWB through RUE  FALLS:  Has patient fallen in last 6 months? Yes. 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  PATIENT SURVEYS:  Quick Dash:  QUICK DASH  Please rate your ability do the following activities in the last week by selecting the number below the appropriate response.   Activities Rating  Open a tight or new jar.  4 = Severe difficulty  Do heavy household chores (e.g., wash walls, floors). 4 = Severe difficulty  Carry a shopping bag or briefcase 4 =  Severe difficulty  Wash your back. 5 = Unable  Use a knife to cut food. 5 = Unable  Recreational activities in which you take some force or impact through your arm, shoulder or hand (e.g., golf, hammering, tennis, etc.). 5 = Unable  During the past week, to what extent has your arm, shoulder or hand problem interfered with your normal social activities with family, friends, neighbors or groups?  4 = Quite a bit  During the past week, were you limited in your work or other regular daily activities as a result of your arm, shoulder or hand problem? 4 = Very limited  Rate the severity of the following symptoms in the last week: Arm, Shoulder, or hand pain. 4 = Severe  Rate the severity of the following symptoms in the last week: Tingling (pins and needles) in your arm, shoulder or hand. 4 = Severe  During the past week, how much difficulty have you had sleeping because of the pain in your arm, shoulder or hand?  5 = So much difficulty that I can't sleep   (A QuickDASH score may not be calculated if there is greater than 1 missing item.)  Quick Dash Disability/Symptom Score: [(sum of 48 (n) responses/11 (n)] x 25 = 84.09  Minimally Clinically Important Difference (MCID): 15-20 points  (Franchignoni, F. et al. (2013). Minimally clinically important difference of the disabilities of the arm, shoulder, and hand outcome measures (DASH) and its shortened version (Quick DASH). Journal of Orthopaedic & Sports Physical Therapy, 44(1), 30-39)   COGNITION: Overall cognitive status: Within functional limits for tasks assessed     SENSATION: Patient reports intermittent right shoulder numbness and tingling in her right shoulder and hand since surgery.   UPPER EXTREMITY ROM:   Active ROM Right Eval (PROM) Left eval  Shoulder flexion 70 145  Shoulder extension    Shoulder abduction 30 131  Shoulder adduction    Shoulder internal rotation 60 To T6  Shoulder external rotation -10 To T3  Elbow flexion     Elbow extension    Wrist flexion  Wrist extension    Wrist ulnar deviation    Wrist radial deviation    Wrist pronation    Wrist supination    (Blank rows = not tested)  UPPER EXTREMITY MMT: not tested due to surgical condition  SHOULDER SPECIAL TESTS: Not tested due to surgical condition  JOINT MOBILITY TESTING:  Not tested due to high pain severity and irritability  PALPATION:  Global right shoulder and UE tenderness to palpation                                                                                                                             TREATMENT DATE:   03/13/24     EXERCISE LOG  Exercise Repetitions and Resistance Comments  Pulleys 6 minutes  Flexion   UE ranger (seated) Flex/ext: CW and CCW circles x 2 mins each Multidirectional   Scapular retraction 20 reps    Manual therapy See below   Wall ladder 3 reps (max # 17)   AA Flexion 2 sets of 10 reps cane   AA Chest Press 2 sets of 10 reps cane    Blank cell = exercise not performed today  Manual Therapy Soft Tissue Mobilization: right shoulder, STW/M to right deltoid and bicep tendon to decrease pain and tone   Modalities: no redness or adverse reaction to today's modalities  Date:  Unattended Estim: Shoulder, IFC 80-150 Hz, 15 mins, Pain and Tone Vaso: Shoulder, 34 degrees; low pressure, 15 mins, Pain and Tone  PATIENT EDUCATION: Education details: healing Person educated: Patient Education method: Explanation Education comprehension: verbalized understanding  HOME EXERCISE PROGRAM: [id number]  ASSESSMENT:  CLINICAL IMPRESSION: Pt arrives for today's treatment session reporting 6/10 right shoulder pain.  Pt reports that she had to take a pain pill last night due to increased pain after treatment session.  Pt introduced to the wall ladder and AA cane exercises today with min cues required for proper technique.  Pt instructed to add AA flexion and chest press to HEP.  STW/M to right deltoid and  bicep tendon to decrease pain and tone.  Normal responses to estim and vaso noted upon removal.  Pt reported 4/10 right shoulder pain at completion of today's treatment session.   OBJECTIVE IMPAIRMENTS: decreased activity tolerance, decreased ROM, decreased strength, hypomobility, impaired sensation, impaired tone, impaired UE functional use, and pain.   ACTIVITY LIMITATIONS: carrying, lifting, sleeping, dressing, reach over head, and hygiene/grooming  PARTICIPATION LIMITATIONS: meal prep, cleaning, laundry, community activity, and occupation  PERSONAL FACTORS: Past/current experiences and 3+ comorbidities: Hypertension, type 1 diabetes, current smoker anxiety, and depression are also affecting patient's functional outcome.   REHAB POTENTIAL: Good  CLINICAL DECISION MAKING: Evolving/moderate complexity  EVALUATION COMPLEXITY: Moderate   GOALS: Goals reviewed with patient? No  SHORT TERM GOALS: Target date: 03/25/24  Patient will be independent with her initial HEP.  Baseline: Goal status: INITIAL  2.  Patient will be able to demonstrate at least 120 degrees of  passive right shoulder flexion for improved shoulder mobility. Baseline:  Goal status: INITIAL  3.  Patient will report being able to type on her computer without being limited by her familiar right shoulder symptoms for improved function with her daily activities. Baseline:  Goal status: INITIAL  4.  Patient will improve her QuickDASH score to 60 or less for improved perceived function with her daily activities. Baseline:  Goal status: INITIAL  LONG TERM GOALS: Target date: 04/15/24  Patient will be independent with her advanced HEP.  Baseline:  Goal status: INITIAL  2.  Patient will be able to demonstrate at least 120 degrees of active right shoulder flexion for improved function reaching overhead. Baseline:  Goal status: INITIAL  3.  Patient will be able to demonstrate at least 120 degrees of active right  shoulder abduction for improved function reaching overhead. Baseline:  Goal status: INITIAL  4.  Patient will be able to carry at least 8 pounds with her right upper extremity for improved function carrying her groceries. Baseline:  Goal status: INITIAL  5.  Patient will be able to complete her critical job demands without being limited by her familiar right shoulder symptoms. Baseline:  Goal status: INITIAL  6.  Patient will improve her QuickDASH score to 40 or less for improved perceived function with her daily activities. Baseline:  Goal status: INITIAL  PLAN:  PT FREQUENCY: 2-3x/week  PT DURATION: other: 7 weeks  PLANNED INTERVENTIONS: 97164- PT Re-evaluation, 97750- Physical Performance Testing, 97110-Therapeutic exercises, 97530- Therapeutic activity, 97112- Neuromuscular re-education, 97535- Self Care, 02859- Manual therapy, [id number]- Electrical stimulation (unattended), 97016- Vasopneumatic device, Patient/Family education, Joint mobilization, Cryotherapy, and Moist heat  PLAN FOR NEXT SESSION: manual therapy, isometrics, AAROM, and modalities as needed   Delon DELENA Gosling, PTA 03/13/2024, 9:49 AM

## 2024-03-19 ENCOUNTER — Ambulatory Visit: Payer: Self-pay

## 2024-03-19 DIAGNOSIS — M25511 Pain in right shoulder: Secondary | ICD-10-CM | POA: Diagnosis not present

## 2024-03-19 DIAGNOSIS — M25611 Stiffness of right shoulder, not elsewhere classified: Secondary | ICD-10-CM

## 2024-03-19 NOTE — Therapy (Signed)
 OUTPATIENT PHYSICAL THERAPY SHOULDER TREATMENT   Patient Name: Traci Ellison MRN: [id number] DOB:04-14-1986, 38 y.o., female Today's Date: 03/19/2024  END OF SESSION:  PT End of Session - 03/19/24 1355     Visit Number 4    Number of Visits 14    Date for PT Re-Evaluation 05/01/24    PT Start Time 1345    PT Stop Time 1448    PT Time Calculation (min) 63 min    Activity Tolerance Patient tolerated treatment well    Behavior During Therapy WFL for tasks assessed/performed            Past Medical History:  Diagnosis Date   Anxiety    8 months ago   Depression    8 months ago   Diabetes mellitus    type 1--since age 27   Diabetes mellitus type I (HCC)    Headache(784.0)    migraines, last 3 days ago-occurs occ.   History of kidney stones    x 1    Vaginal Pap smear, abnormal    Past Surgical History:  Procedure Laterality Date   CERVICAL BIOPSY  W/ LOOP ELECTRODE EXCISION     childbirth      x2 -last 3 months ago-[facility]   CYST REMOVAL TRUNK Left 04/03/2013   Procedure: EXCISION CHRONIC SEBACEOUS CYST LEFT UPPER CHEST WALL;  Surgeon: Vicenta DELENA Poli, MD;  Location: [facility];  Service: General;  Laterality: Left;   INDUCED ABORTION     Patient Active Problem List   Diagnosis Date Noted   Type 1 diabetes mellitus without complication (HCC) 02/06/2024   Type 1 diabetes mellitus with hyperglycemia (HCC) 05/30/2023   Poorly controlled type 1 diabetes mellitus (HCC) 07/06/2021   Essential hypertension, benign 07/06/2021   Mixed hyperlipidemia 07/06/2021   Personal history of noncompliance with medical treatment, presenting hazards to health 03/20/2017   Current smoker 09/28/2016   Uncontrolled type 1 diabetes mellitus with complication 09/21/2016   Pre-existing type 1 diabetes mellitus in pregnancy in first trimester 05/13/2012   Sebaceous cyst 04/28/2012   REFERRING PROVIDER: Heyward Pastor, MD  REFERRING DIAG: Rotator cuff syndrome of right  shoulder, shoulder joint stiffness, right, biceps tendonitis of right shoulder  THERAPY DIAG:  Acute pain of right shoulder  Stiffness of right shoulder, not elsewhere classified  Rationale for Evaluation and Treatment: Rehabilitation  ONSET DATE: 02/25/24  SUBJECTIVE:                                                                                                                                                                                      SUBJECTIVE STATEMENT: Patient reports that she is feeling  it a little today.   Hand dominance: Right  PERTINENT HISTORY: Hypertension, type 1 diabetes, current smoker anxiety, and depression  PAIN:  Are you having pain? Yes: NPRS scale: 3/10 Pain location: right shoulder, but can radiate down her arm Pain description: tingling, sharp, stabbing,  Aggravating factors: moving her arm Relieving factors: none  PRECAUTIONS: Shoulder; no lifting with RUE  RED FLAGS: None   WEIGHT BEARING RESTRICTIONS: Yes NWB through RUE  FALLS:  Has patient fallen in last 6 months? Yes. 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  PATIENT SURVEYS:  Quick Dash:  QUICK DASH  Please rate your ability do the following activities in the last week by selecting the number below the appropriate response.   Activities Rating  Open a tight or new jar.  4 = Severe difficulty  Do heavy household chores (e.g., wash walls, floors). 4 = Severe difficulty  Carry a shopping bag or briefcase 4 = Severe difficulty  Wash your back. 5 = Unable  Use a knife to  cut food. 5 = Unable  Recreational activities in which you take some force or impact through your arm, shoulder or hand (e.g., golf, hammering, tennis, etc.). 5 = Unable  During the past week, to what extent has your arm, shoulder or hand problem interfered with your normal social activities with family, friends, neighbors or groups?  4 = Quite a bit  During the past week, were you limited in your work or other regular daily activities as a result of your arm, shoulder or hand problem? 4 = Very limited  Rate the severity of the following symptoms in the last week: Arm, Shoulder, or hand pain. 4 = Severe  Rate the severity of the following symptoms in the last week: Tingling (pins and needles) in your arm, shoulder or hand. 4 = Severe  During the past week, how much difficulty have you had sleeping because of the pain in your arm, shoulder or hand?  5 = So much difficulty that I can't sleep   (A QuickDASH score may not be calculated if there is greater than 1 missing item.)  Quick Dash Disability/Symptom Score: [(sum of 48 (n) responses/11 (n)] x 25 = 84.09  Minimally Clinically Important Difference (MCID): 15-20 points  (Franchignoni, F. et al. (2013). Minimally clinically important difference of the disabilities of the arm, shoulder, and hand outcome measures (DASH) and its shortened version (Quick DASH). Journal of Orthopaedic & Sports Physical Therapy, 44(1), 30-39)   COGNITION: Overall cognitive status: Within functional limits for tasks assessed     SENSATION: Patient reports intermittent right shoulder numbness and tingling in her right shoulder and hand since surgery.   UPPER EXTREMITY ROM:   Active ROM Right Eval (PROM) Left eval  Shoulder flexion 70 145  Shoulder extension    Shoulder abduction 30 131  Shoulder adduction    Shoulder internal rotation 60 To T6  Shoulder external rotation -10 To T3  Elbow flexion    Elbow extension    Wrist flexion    Wrist extension     Wrist ulnar deviation  Wrist radial deviation    Wrist pronation    Wrist supination    (Blank rows = not tested)  UPPER EXTREMITY MMT: not tested due to surgical condition  SHOULDER SPECIAL TESTS: Not tested due to surgical condition  JOINT MOBILITY TESTING:  Not tested due to high pain severity and irritability  PALPATION:  Global right shoulder and UE tenderness to palpation                                                                                                                             TREATMENT DATE:                                   03/19/24 EXERCISE LOG  Exercise Repetitions and Resistance Comments  Pulleys  7 minutes  Flexion   UE Ranger (seated) 4 minutes  Multidirectional  UBE  6 minutes @ 120 RPM    R shoulder flexion isometric  2.5 minutes w/ 5 second hold    R shoulder extension isometric  3 minutes w/ 5 second hold    R shoulder ADD isometric  2.5 minutes w/ 5 second hold   Wall slides  2 minutes  Flexion  Seated cane punch out  20 reps    Seated cane flexion  2 x 10 reps    Wall push up  12 reps    Isometric ball squeeze  2 minutes w/ 5 second hold  For shoulder IR    Blank cell = exercise not performed today  Modalities: no adverse reaction to today's modalities  Date:  Vaso: Shoulder, 34 degrees; low pressure, 15 mins, Pain  03/13/24     EXERCISE LOG  Exercise Repetitions and Resistance Comments  Pulleys 6 minutes  Flexion   UE ranger (seated) Flex/ext: CW and CCW circles x 2 mins each Multidirectional   Scapular retraction 20 reps    Manual therapy See below   Wall ladder 3 reps (max # 17)   AA Flexion 2 sets of 10 reps cane   AA Chest Press 2 sets of 10 reps cane    Blank cell = exercise not performed today  Manual Therapy Soft Tissue Mobilization: right shoulder, STW/M to right deltoid and bicep tendon to decrease pain and tone   Modalities: no redness or adverse reaction to today's modalities  Date:  Unattended Estim: Shoulder,  IFC 80-150 Hz, 15 mins, Pain and Tone Vaso: Shoulder, 34 degrees; low pressure, 15 mins, Pain and Tone  PATIENT EDUCATION: Education details: healing Person educated: Patient Education method: Explanation Education comprehension: verbalized understanding  HOME EXERCISE PROGRAM: [id number]  ASSESSMENT:  CLINICAL IMPRESSION: Patient was introduced to multiple new interventions for improved isometric interventions needed for improved functional mobility. She required minimal to moderate multimodal cueing with today's isometric interventions for proper force production. She experienced a mild increase in soreness with  today's interventions, but this  did not limit her ability to completed any of today's interventions. She reported that her shoulder felt alright upon the conclusion of treatment. She continues to require skilled physical therapy to address her remaining impairments to return to her prior level of function.   OBJECTIVE IMPAIRMENTS: decreased activity tolerance, decreased ROM, decreased strength, hypomobility, impaired sensation, impaired tone, impaired UE functional use, and pain.   ACTIVITY LIMITATIONS: carrying, lifting, sleeping, dressing, reach over head, and hygiene/grooming  PARTICIPATION LIMITATIONS: meal prep, cleaning, laundry, community activity, and occupation  PERSONAL FACTORS: Past/current experiences and 3+ comorbidities: Hypertension, type 1 diabetes, current smoker anxiety, and depression are also affecting patient's functional outcome.   REHAB POTENTIAL: Good  CLINICAL DECISION MAKING: Evolving/moderate complexity  EVALUATION COMPLEXITY: Moderate   GOALS: Goals reviewed with patient? No  SHORT TERM GOALS: Target date: 03/25/24  Patient will be independent with her initial HEP.  Baseline: Goal status: INITIAL  2.  Patient will be able to demonstrate at least 120 degrees of passive right shoulder flexion for improved shoulder mobility. Baseline:  Goal  status: INITIAL  3.  Patient will report being able to type on her computer without being limited by her familiar right shoulder symptoms for improved function with her daily activities. Baseline:  Goal status: INITIAL  4.  Patient will improve her QuickDASH score to 60 or less for improved perceived function with her daily activities. Baseline:  Goal status: INITIAL  LONG TERM GOALS: Target date: 04/15/24  Patient will be independent with her advanced HEP.  Baseline:  Goal status: INITIAL  2.  Patient will be able to demonstrate at least 120 degrees of active right shoulder flexion for improved function reaching overhead. Baseline:  Goal status: INITIAL  3.  Patient will be able to demonstrate at least 120 degrees of active right shoulder abduction for improved function reaching overhead. Baseline:  Goal status: INITIAL  4.  Patient will be able to carry at least 8 pounds with her right upper extremity for improved function carrying her groceries. Baseline:  Goal status: INITIAL  5.  Patient will be able to complete her critical job demands without being limited by her familiar right shoulder symptoms. Baseline:  Goal status: INITIAL  6.  Patient will improve her QuickDASH score to 40 or less for improved perceived function with her daily activities. Baseline:  Goal status: INITIAL  PLAN:  PT FREQUENCY: 2-3x/week  PT DURATION: other: 7 weeks  PLANNED INTERVENTIONS: 97164- PT Re-evaluation, 97750- Physical Performance Testing, 97110-Therapeutic exercises, 97530- Therapeutic activity, [id number]- Neuromuscular re-education, 97535- Self Care, [id number]- Manual therapy, G0283- Electrical stimulation (unattended), 97016- Vasopneumatic device, Patient/Family education, Joint mobilization, Cryotherapy, and Moist heat  PLAN FOR NEXT SESSION: manual therapy, isometrics, AAROM, and modalities as needed   Lacinda JAYSON Fass, PT 03/19/2024, 4:21 PM

## 2024-03-23 ENCOUNTER — Ambulatory Visit

## 2024-03-23 DIAGNOSIS — G8929 Other chronic pain: Secondary | ICD-10-CM

## 2024-03-23 DIAGNOSIS — M25511 Pain in right shoulder: Secondary | ICD-10-CM | POA: Diagnosis not present

## 2024-03-23 DIAGNOSIS — M25611 Stiffness of right shoulder, not elsewhere classified: Secondary | ICD-10-CM

## 2024-03-23 NOTE — Therapy (Signed)
 OUTPATIENT PHYSICAL THERAPY SHOULDER TREATMENT   Patient Name: Traci Ellison MRN: [id number] DOB:[birth year], 38 y.o., female Today's Date: 03/23/2024  END OF SESSION:  PT End of Session - 03/23/24 1519     Visit Number 5    Number of Visits 14    Date for PT Re-Evaluation 05/01/24    PT Start Time 1515    PT Stop Time 1559    PT Time Calculation (min) 44 min    Activity Tolerance Patient tolerated treatment well    Behavior During Therapy WFL for tasks assessed/performed            Past Medical History:  Diagnosis Date   Anxiety    8 months ago   Depression    8 months ago   Diabetes mellitus    type 1--since age 20   Diabetes mellitus type I (HCC)    Headache(784.0)    migraines, last 3 days ago-occurs occ.   History of kidney stones    x 1    Vaginal Pap smear, abnormal    Past Surgical History:  Procedure Laterality Date   CERVICAL BIOPSY  W/ LOOP ELECTRODE EXCISION     childbirth      x2 -last 3 months ago-[facility]   CYST REMOVAL TRUNK Left 04/03/2013   Procedure: EXCISION CHRONIC SEBACEOUS CYST LEFT UPPER CHEST WALL;  Surgeon: Vicenta DELENA Poli, MD;  Location: [facility];  Service: General;  Laterality: Left;   INDUCED ABORTION     Patient Active Problem List   Diagnosis Date Noted   Type 1 diabetes mellitus without complication (HCC) 02/06/2024   Type 1 diabetes mellitus with hyperglycemia (HCC) 05/30/2023   Poorly controlled type 1 diabetes mellitus (HCC) 07/06/2021   Essential hypertension, benign 07/06/2021   Mixed hyperlipidemia 07/06/2021   Personal history of noncompliance with medical treatment, presenting hazards to health 03/20/2017   Current smoker 09/28/2016   Uncontrolled type 1 diabetes mellitus with complication 09/21/2016   Pre-existing type 1 diabetes mellitus in pregnancy in first trimester 05/13/2012   Sebaceous cyst 04/28/2012   REFERRING PROVIDER: Heyward Pastor, MD  REFERRING DIAG: Rotator cuff syndrome of right  shoulder, shoulder joint stiffness, right, biceps tendonitis of right shoulder  THERAPY DIAG:  Acute pain of right shoulder  Stiffness of right shoulder, not elsewhere classified  Chronic right shoulder pain  Rationale for Evaluation and Treatment: Rehabilitation  ONSET DATE: 02/25/24  SUBJECTIVE:                                                                                                                                                                                      SUBJECTIVE STATEMENT: Patient  denies any pain today.   Hand dominance: Right  PERTINENT HISTORY: Hypertension, type 1 diabetes, current smoker anxiety, and depression  PAIN:  Are you having pain? No  PRECAUTIONS: Shoulder; no lifting with RUE  RED FLAGS: None   WEIGHT BEARING RESTRICTIONS: Yes NWB through RUE  FALLS:  Has patient fallen in last 6 months? Yes. 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  PATIENT SURVEYS:  Quick Dash:  QUICK DASH  Please rate your ability do the following activities in the last week by selecting the number below the appropriate response.   Activities Rating  Open a tight or new jar.  4 = Severe difficulty  Do heavy household chores (e.g., wash walls, floors). 4 = Severe difficulty  Carry a shopping bag or briefcase 4 = Severe difficulty  Wash your back. 5 = Unable  Use a knife to cut food. 5 = Unable  Recreational activities in which you take some force or impact through your arm, shoulder or hand (e.g., golf, hammering, tennis, etc.). 5 = Unable  During  the past week, to what extent has your arm, shoulder or hand problem interfered with your normal social activities with family, friends, neighbors or groups?  4 = Quite a bit  During the past week, were you limited in your work or other regular daily activities as a result of your arm, shoulder or hand problem? 4 = Very limited  Rate the severity of the following symptoms in the last week: Arm, Shoulder, or hand pain. 4 = Severe  Rate the severity of the following symptoms in the last week: Tingling (pins and needles) in your arm, shoulder or hand. 4 = Severe  During the past week, how much difficulty have you had sleeping because of the pain in your arm, shoulder or hand?  5 = So much difficulty that I can't sleep   (A QuickDASH score may not be calculated if there is greater than 1 missing item.)  Quick Dash Disability/Symptom Score: [(sum of 48 (n) responses/11 (n)] x 25 = 84.09  Minimally Clinically Important Difference (MCID): 15-20 points  (Franchignoni, F. et al. (2013). Minimally clinically important difference of the disabilities of the arm, shoulder, and hand outcome measures (DASH) and its shortened version (Quick DASH). Journal of Orthopaedic & Sports Physical Therapy, 44(1), 30-39)   COGNITION: Overall cognitive status: Within functional limits for tasks assessed     SENSATION: Patient reports intermittent right shoulder numbness and tingling in her right shoulder and hand since surgery.   UPPER EXTREMITY ROM:   Active ROM Right Eval (PROM) Left eval  Shoulder flexion 70 145  Shoulder extension    Shoulder abduction 30 131  Shoulder adduction    Shoulder internal rotation 60 To T6  Shoulder external rotation -10 To T3  Elbow flexion    Elbow extension    Wrist flexion    Wrist extension    Wrist ulnar deviation    Wrist radial deviation    Wrist pronation    Wrist supination    (Blank rows = not tested)  UPPER EXTREMITY MMT: not tested due  to surgical  condition  SHOULDER SPECIAL TESTS: Not tested due to surgical condition  JOINT MOBILITY TESTING:  Not tested due to high pain severity and irritability  PALPATION:  Global right shoulder and UE tenderness to palpation                                                                                                                             TREATMENT DATE:   03/23/24     EXERCISE LOG  Exercise Repetitions and Resistance Comments  Pulleys  7 minutes  Flexion   UE Ranger (seated) 5 minutes  Multidirectional  UBE  8 minutes @ 120 RPM (forward/backward)   Wall Ladder 5 reps (max# 23)   R shoulder flexion isometric     R shoulder extension isometric     R shoulder ADD isometric     Wall washes 3 minutes  Multidirectional  Seated cane punch out  2# x 20 reps    Seated cane flexion  2# 2 x 10 reps    Flexion 1# 2 x 10 reps   Abduction 1# 2 x 10 reps   Wall push up  15 reps    Isometric ball squeeze  2 minutes w/ 5 second hold  For shoulder IR    Blank cell = exercise not performed today    03/13/24     EXERCISE LOG  Exercise Repetitions and Resistance Comments  Pulleys 6 minutes  Flexion   UE ranger (seated) Flex/ext: CW and CCW circles x 2 mins each Multidirectional   Scapular retraction 20 reps    Manual therapy See below   Wall ladder 3 reps (max # 17)   AA Flexion 2 sets of 10 reps cane   AA Chest Press 2 sets of 10 reps cane    Blank cell = exercise not performed today  Manual Therapy Soft Tissue Mobilization: right shoulder, STW/M to right deltoid and bicep tendon to decrease pain and tone   Modalities: no redness or adverse reaction to today's modalities  Date:  Unattended Estim: Shoulder, IFC 80-150 Hz, 15 mins, Pain and Tone Vaso: Shoulder, 34 degrees; low pressure, 15 mins, Pain and Tone  PATIENT EDUCATION: Education details: healing Person educated: Patient Education method: Explanation Education comprehension: verbalized understanding  HOME EXERCISE  PROGRAM: [id number]  ASSESSMENT:  CLINICAL IMPRESSION: Pt arrives for today's treatment session denying any pain.  Pt able to demonstrate 121 degrees of passive right shoulder flexion and 112 degrees of active right shoulder flexion, meeting her STG and making good progress towards her LTG.  Pt able to demonstrate 125 degrees of right shoulder abduction with max effort.  Pt able to decrease QuickDash to 22.7 well surpassing her short and long term goals.  Pt able to tolerate increased reps with all previously performed exercises.  Pt introduced to resisted active flexion and abduction with fatigue noted.  Pt reported 3/10 right shoulder pain at completion of today's treatment session.  OBJECTIVE  IMPAIRMENTS: decreased activity tolerance, decreased ROM, decreased strength, hypomobility, impaired sensation, impaired tone, impaired UE functional use, and pain.   ACTIVITY LIMITATIONS: carrying, lifting, sleeping, dressing, reach over head, and hygiene/grooming  PARTICIPATION LIMITATIONS: meal prep, cleaning, laundry, community activity, and occupation  PERSONAL FACTORS: Past/current experiences and 3+ comorbidities: Hypertension, type 1 diabetes, current smoker anxiety, and depression are also affecting patient's functional outcome.   REHAB POTENTIAL: Good  CLINICAL DECISION MAKING: Evolving/moderate complexity  EVALUATION COMPLEXITY: Moderate   GOALS: Goals reviewed with patient? No  SHORT TERM GOALS: Target date: 03/25/24  Patient will be independent with her initial HEP.  Baseline: Goal status: MET  2.  Patient will be able to demonstrate at least 120 degrees of passive right shoulder flexion for improved shoulder mobility. Baseline: 8/18: 121 degrees Goal status: MET  3.  Patient will report being able to type on her computer without being limited by her familiar right shoulder symptoms for improved function with her daily activities. Baseline:  Goal status: MET  4.  Patient will  improve her QuickDASH score to 60 or less for improved perceived function with her daily activities. Baseline: 8/18: 22.7 Goal status: MET  LONG TERM GOALS: Target date: 04/15/24  Patient will be independent with her advanced HEP.  Baseline:  Goal status: IN PROGRESS  2.  Patient will be able to demonstrate at least 120 degrees of active right shoulder flexion for improved function reaching overhead. Baseline: 8/18: 112 degrees Goal status: IN PROGRESS  3.  Patient will be able to demonstrate at least 120 degrees of active right shoulder abduction for improved function reaching overhead. Baseline: 8/18: 125 degrees with max effort Goal status: IN PROGRESS  4.  Patient will be able to carry at least 8 pounds with her right upper extremity for improved function carrying her groceries. Baseline: 8/18: unable at this time due to protocol Goal status: INITIAL  5.  Patient will be able to complete her critical job demands without being limited by her familiar right shoulder symptoms. Baseline:  Goal status: IN PROGRESS  6.  Patient will improve her QuickDASH score to 40 or less for improved perceived function with her daily activities. Baseline: 8/18: 22.7 Goal status: MET  PLAN:  PT FREQUENCY: 2-3x/week  PT DURATION: other: 7 weeks  PLANNED INTERVENTIONS: 97164- PT Re-evaluation, 97750- Physical Performance Testing, 97110-Therapeutic exercises, 97530- Therapeutic activity, 97112- Neuromuscular re-education, 97535- Self Care, 02859- Manual therapy, G0283- Electrical stimulation (unattended), 97016- Vasopneumatic device, Patient/Family education, Joint mobilization, Cryotherapy, and Moist heat  PLAN FOR NEXT SESSION: manual therapy, isometrics, AAROM, and modalities as needed   Delon DELENA Gosling, PTA 03/23/2024, 4:09 PM

## 2024-03-25 ENCOUNTER — Ambulatory Visit

## 2024-03-25 DIAGNOSIS — M25511 Pain in right shoulder: Secondary | ICD-10-CM

## 2024-03-25 DIAGNOSIS — M25611 Stiffness of right shoulder, not elsewhere classified: Secondary | ICD-10-CM

## 2024-03-25 NOTE — Therapy (Signed)
 OUTPATIENT PHYSICAL THERAPY SHOULDER TREATMENT   Patient Name: Traci Ellison MRN: [id number] DOB:01-04-1986, 38 y.o., female Today's Date: 03/25/2024  END OF SESSION:  PT End of Session - 03/25/24 1507     Visit Number 6    Number of Visits 14    Date for PT Re-Evaluation 05/01/24    PT Start Time 1503    PT Stop Time 1558    PT Time Calculation (min) 55 min    Activity Tolerance Patient tolerated treatment well    Behavior During Therapy WFL for tasks assessed/performed             Past Medical History:  Diagnosis Date   Anxiety    8 months ago   Depression    8 months ago   Diabetes mellitus    type 1--since age 52   Diabetes mellitus type I (HCC)    Headache(784.0)    migraines, last 3 days ago-occurs occ.   History of kidney stones    x 1    Vaginal Pap smear, abnormal    Past Surgical History:  Procedure Laterality Date   CERVICAL BIOPSY  W/ LOOP ELECTRODE EXCISION     childbirth      x2 -last 3 months ago-[facility]   CYST REMOVAL TRUNK Left 04/03/2013   Procedure: EXCISION CHRONIC SEBACEOUS CYST LEFT UPPER CHEST WALL;  Surgeon: Vicenta DELENA Poli, MD;  Location: [facility];  Service: General;  Laterality: Left;   INDUCED ABORTION     Patient Active Problem List   Diagnosis Date Noted   Type 1 diabetes mellitus without complication (HCC) 02/06/2024   Type 1 diabetes mellitus with hyperglycemia (HCC) 05/30/2023   Poorly controlled type 1 diabetes mellitus (HCC) 07/06/2021   Essential hypertension, benign 07/06/2021   Mixed hyperlipidemia 07/06/2021   Personal history of noncompliance with medical treatment, presenting hazards to health 03/20/2017   Current smoker 09/28/2016   Uncontrolled type 1 diabetes mellitus with complication 09/21/2016   Pre-existing type 1 diabetes mellitus in pregnancy in first trimester 05/13/2012   Sebaceous cyst 04/28/2012   REFERRING PROVIDER: Heyward Pastor, MD  REFERRING DIAG: Rotator cuff syndrome of right  shoulder, shoulder joint stiffness, right, biceps tendonitis of right shoulder  THERAPY DIAG:  Acute pain of right shoulder  Stiffness of right shoulder, not elsewhere classified  Rationale for Evaluation and Treatment: Rehabilitation  ONSET DATE: 02/25/24  SUBJECTIVE:                                                                                                                                                                                      SUBJECTIVE STATEMENT: Patient reports that she is  not hurting today. She has been sore from work and her exercises.   Hand dominance: Right  PERTINENT HISTORY: Hypertension, type 1 diabetes, current smoker anxiety, and depression  PAIN:  Are you having pain? No  PRECAUTIONS: Shoulder; no lifting with RUE  RED FLAGS: None   WEIGHT BEARING RESTRICTIONS: Yes NWB through RUE  FALLS:  Has patient fallen in last 6 months? Yes. 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  PATIENT SURVEYS:  Quick Dash:  QUICK DASH  Please rate your ability do the following activities in the last week by selecting the number below the appropriate response.   Activities Rating  Open a tight or new jar.  4 = Severe difficulty  Do heavy household chores (e.g., wash walls, floors). 4 = Severe difficulty  Carry a shopping bag or briefcase 4 = Severe difficulty  Wash your back. 5 = Unable  Use a knife to cut food. 5 = Unable  Recreational activities in which you take some force or impact through your arm, shoulder or hand (e.g., golf, hammering,  tennis, etc.). 5 = Unable  During the past week, to what extent has your arm, shoulder or hand problem interfered with your normal social activities with family, friends, neighbors or groups?  4 = Quite a bit  During the past week, were you limited in your work or other regular daily activities as a result of your arm, shoulder or hand problem? 4 = Very limited  Rate the severity of the following symptoms in the last week: Arm, Shoulder, or hand pain. 4 = Severe  Rate the severity of the following symptoms in the last week: Tingling (pins and needles) in your arm, shoulder or hand. 4 = Severe  During the past week, how much difficulty have you had sleeping because of the pain in your arm, shoulder or hand?  5 = So much difficulty that I can't sleep   (A QuickDASH score may not be calculated if there is greater than 1 missing item.)  Quick Dash Disability/Symptom Score: [(sum of 48 (n) responses/11 (n)] x 25 = 84.09  Minimally Clinically Important Difference (MCID): 15-20 points  (Franchignoni, F. et al. (2013). Minimally clinically important difference of the disabilities of the arm, shoulder, and hand outcome measures (DASH) and its shortened version (Quick DASH). Journal of Orthopaedic & Sports Physical Therapy, 44(1), 30-39)   COGNITION: Overall cognitive status: Within functional limits for tasks assessed     SENSATION: Patient reports intermittent right shoulder numbness and tingling in her right shoulder and hand since surgery.   UPPER EXTREMITY ROM:   Active ROM Right Eval (PROM) Left eval  Shoulder flexion 70 145  Shoulder extension    Shoulder abduction 30 131  Shoulder adduction    Shoulder internal rotation 60 To T6  Shoulder external rotation -10 To T3  Elbow flexion    Elbow extension    Wrist flexion    Wrist extension    Wrist ulnar deviation    Wrist radial deviation    Wrist pronation    Wrist supination    (Blank rows = not  tested)  UPPER EXTREMITY MMT: not  tested due to surgical condition  SHOULDER SPECIAL TESTS: Not tested due to surgical condition  JOINT MOBILITY TESTING:  Not tested due to high pain severity and irritability  PALPATION:  Global right shoulder and UE tenderness to palpation                                                                                                                             TREATMENT DATE:                                    03/25/24 EXERCISE LOG  Exercise Repetitions and Resistance Comments  Pulleys  5 minutes  Flexion  UBE  10 minutes @ 90 RPM    UE ranger  (seated) 5 minutes  Multidirectional  Resisted row   Green t-band x 2 minutes RUE only   Doorway stretch  4 x 30 seconds    AA cane ABD  2 x 10 reps w/ 3 second hold  131 degrees after this intervention  Wall ladder  10 reps  Max #24  Wall circles  2 x 1 minute At 90 degrees shoulder flexion   Therabar bending  Red t-bar x 30 reps each  Up and down   Resisted IR  Red t-band x 2 x 15 reps     Blank cell = exercise not performed today   03/23/24     EXERCISE LOG  Exercise Repetitions and Resistance Comments  Pulleys  7 minutes  Flexion   UE Ranger (seated) 5 minutes  Multidirectional  UBE  8 minutes @ 120 RPM (forward/backward)   Wall Ladder 5 reps (max# 23)   R shoulder flexion isometric     R shoulder extension isometric     R shoulder ADD isometric     Wall washes 3 minutes  Multidirectional  Seated cane punch out  2# x 20 reps    Seated cane flexion  2# 2 x 10 reps    Flexion 1# 2 x 10 reps   Abduction 1# 2 x 10 reps   Wall push up  15 reps    Isometric ball squeeze  2 minutes w/ 5 second hold  For shoulder IR    Blank cell = exercise not performed today    03/13/24     EXERCISE LOG  Exercise Repetitions and Resistance Comments  Pulleys 6 minutes  Flexion   UE ranger (seated) Flex/ext: CW and CCW circles x 2 mins each Multidirectional   Scapular retraction 20 reps    Manual therapy See below   Wall ladder 3 reps (max #  17)   AA Flexion 2 sets of 10 reps cane   AA Chest Press 2 sets of 10 reps cane    Blank cell = exercise not performed today  Manual Therapy Soft Tissue Mobilization: right shoulder, STW/M to  right deltoid and bicep tendon to decrease pain and tone   Modalities: no redness or adverse reaction to today's modalities  Date:  Unattended Estim: Shoulder, IFC 80-150 Hz, 15 mins, Pain and Tone Vaso: Shoulder, 34 degrees; low pressure, 15 mins, Pain and Tone  PATIENT EDUCATION: Education details: healing and expectation for soreness Person educated: Patient Education method: Explanation Education comprehension: verbalized understanding  HOME EXERCISE PROGRAM: [id number]  ASSESSMENT:  CLINICAL IMPRESSION: Patient progressed with multiple new and familiar interventions for improved right shoulder mobility and rotator cuff engagement needed for her daily activities. She required minimal cueing with today's new interventions for proper exercise performance. She experienced no increase in pain or discomfort with any of today's interventions. She reported that her arm was tired upon the conclusion of treatment. She continues to require skilled physical therapy to address her remaining impairments to return to her prior level of function.   OBJECTIVE IMPAIRMENTS: decreased activity tolerance, decreased ROM, decreased strength, hypomobility, impaired sensation, impaired tone, impaired UE functional use, and pain.   ACTIVITY LIMITATIONS: carrying, lifting, sleeping, dressing, reach over head, and hygiene/grooming  PARTICIPATION LIMITATIONS: meal prep, cleaning, laundry, community activity, and occupation  PERSONAL FACTORS: Past/current experiences and 3+ comorbidities: Hypertension, type 1 diabetes, current smoker anxiety, and depression are also affecting patient's functional outcome.   REHAB POTENTIAL: Good  CLINICAL DECISION MAKING: Evolving/moderate complexity  EVALUATION COMPLEXITY:  Moderate   GOALS: Goals reviewed with patient? No  SHORT TERM GOALS: Target date: 03/25/24  Patient will be independent with her initial HEP.  Baseline: Goal status: MET  2.  Patient will be able to demonstrate at least 120 degrees of passive right shoulder flexion for improved shoulder mobility. Baseline: 8/18: 121 degrees Goal status: MET  3.  Patient will report being able to type on her computer without being limited by her familiar right shoulder symptoms for improved function with her daily activities. Baseline:  Goal status: MET  4.  Patient will improve her QuickDASH score to 60 or less for improved perceived function with her daily activities. Baseline: 8/18: 22.7 Goal status: MET  LONG TERM GOALS: Target date: 04/15/24  Patient will be independent with her advanced HEP.  Baseline:  Goal status: IN PROGRESS  2.  Patient will be able to demonstrate at least 120 degrees of active right shoulder flexion for improved function reaching overhead. Baseline: 8/18: 112 degrees 03/25/24: 132 degrees Goal status: MET  3.  Patient will be able to demonstrate at least 120 degrees of active right shoulder abduction for improved function reaching overhead. Baseline: 8/18: 125 degrees with max effort Goal status: IN PROGRESS  4.  Patient will be able to carry at least 8 pounds with her right upper extremity for improved function carrying her groceries. Baseline: 8/18: unable at this time due to protocol Goal status: INITIAL  5.  Patient will be able to complete her critical job demands without being limited by her familiar right shoulder symptoms. Baseline:  Goal status: IN PROGRESS  6.  Patient will improve her QuickDASH score to 40 or less for improved perceived function with her daily activities. Baseline: 8/18: 22.7 Goal status: MET  PLAN:  PT FREQUENCY: 2-3x/week  PT DURATION: other: 7 weeks  PLANNED INTERVENTIONS: 97164- PT Re-evaluation, 97750- Physical  Performance Testing, 97110-Therapeutic exercises, 97530- Therapeutic activity, [id number]- Neuromuscular re-education, 97535- Self Care, [id number]- Manual therapy, [id number]- Electrical stimulation (unattended), 97016- Vasopneumatic device, Patient/Family education, Joint mobilization, Cryotherapy, and Moist heat  PLAN FOR NEXT SESSION: manual therapy, isometrics,  AAROM, and modalities as needed   Lacinda JAYSON Fass, PT 03/25/2024, 4:23 PM

## 2024-03-30 ENCOUNTER — Ambulatory Visit

## 2024-03-30 DIAGNOSIS — M25611 Stiffness of right shoulder, not elsewhere classified: Secondary | ICD-10-CM

## 2024-03-30 DIAGNOSIS — M25511 Pain in right shoulder: Secondary | ICD-10-CM | POA: Diagnosis not present

## 2024-03-30 NOTE — Therapy (Signed)
 OUTPATIENT PHYSICAL THERAPY SHOULDER TREATMENT   Patient Name: Traci Ellison MRN: [id number] DOB:[birth year], 38 y.o., female Today's Date: 03/30/2024  END OF SESSION:  PT End of Session - 03/30/24 1627     Visit Number 7    Number of Visits 14    Date for PT Re-Evaluation 05/01/24    PT Start Time 1627    PT Stop Time 1710    PT Time Calculation (min) 43 min    Activity Tolerance Patient tolerated treatment well    Behavior During Therapy WFL for tasks assessed/performed              Past Medical History:  Diagnosis Date   Anxiety    8 months ago   Depression    8 months ago   Diabetes mellitus    type 1--since age 52   Diabetes mellitus type I (HCC)    Headache(784.0)    migraines, last 3 days ago-occurs occ.   History of kidney stones    x 1    Vaginal Pap smear, abnormal    Past Surgical History:  Procedure Laterality Date   CERVICAL BIOPSY  W/ LOOP ELECTRODE EXCISION     childbirth      x2 -last 3 months ago-[facility]   CYST REMOVAL TRUNK Left 04/03/2013   Procedure: EXCISION CHRONIC SEBACEOUS CYST LEFT UPPER CHEST WALL;  Surgeon: Vicenta DELENA Poli, MD;  Location: [facility];  Service: General;  Laterality: Left;   INDUCED ABORTION     Patient Active Problem List   Diagnosis Date Noted   Type 1 diabetes mellitus without complication (HCC) 02/06/2024   Type 1 diabetes mellitus with hyperglycemia (HCC) 05/30/2023   Poorly controlled type 1 diabetes mellitus (HCC) 07/06/2021   Essential hypertension, benign 07/06/2021   Mixed hyperlipidemia 07/06/2021   Personal history of noncompliance with medical treatment, presenting hazards to health 03/20/2017   Current smoker 09/28/2016   Uncontrolled type 1 diabetes mellitus with complication 09/21/2016   Pre-existing type 1 diabetes mellitus in pregnancy in first trimester 05/13/2012   Sebaceous cyst 04/28/2012   REFERRING PROVIDER: Heyward Pastor, MD  REFERRING DIAG: Rotator cuff syndrome of right  shoulder, shoulder joint stiffness, right, biceps tendonitis of right shoulder  THERAPY DIAG:  Acute pain of right shoulder  Stiffness of right shoulder, not elsewhere classified  Rationale for Evaluation and Treatment: Rehabilitation  ONSET DATE: 02/25/24  SUBJECTIVE:                                                                                                                                                                                      SUBJECTIVE STATEMENT: Patient reports that her  shoulder feels good today. She was able to work today without her shoulder bothering her, but she did not have to do anything strenuous.   Hand dominance: Right  PERTINENT HISTORY: Hypertension, type 1 diabetes, current smoker anxiety, and depression  PAIN:  Are you having pain? No  PRECAUTIONS: Shoulder; no lifting with RUE  RED FLAGS: None   WEIGHT BEARING RESTRICTIONS: Yes NWB through RUE  FALLS:  Has patient fallen in last 6 months? Yes. 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  PATIENT SURVEYS:  Quick Dash:  QUICK DASH  Please rate your ability do the following activities in the last week by selecting the number below the appropriate response.   Activities Rating  Open a tight or new jar.  4 = Severe difficulty  Do heavy household chores (e.g., wash walls, floors). 4 = Severe difficulty  Carry a shopping bag or briefcase 4 = Severe difficulty  Wash your back. 5 = Unable  Use a knife to cut food. 5 = Unable  Recreational activities in which you take some force or  impact through your arm, shoulder or hand (e.g., golf, hammering, tennis, etc.). 5 = Unable  During the past week, to what extent has your arm, shoulder or hand problem interfered with your normal social activities with family, friends, neighbors or groups?  4 = Quite a bit  During the past week, were you limited in your work or other regular daily activities as a result of your arm, shoulder or hand problem? 4 = Very limited  Rate the severity of the following symptoms in the last week: Arm, Shoulder, or hand pain. 4 = Severe  Rate the severity of the following symptoms in the last week: Tingling (pins and needles) in your arm, shoulder or hand. 4 = Severe  During the past week, how much difficulty have you had sleeping because of the pain in your arm, shoulder or hand?  5 = So much difficulty that I can't sleep   (A QuickDASH score may not be calculated if there is greater than 1 missing item.)  Quick Dash Disability/Symptom Score: [(sum of 48 (n) responses/11 (n)] x 25 = 84.09  Minimally Clinically Important Difference (MCID): 15-20 points  (Franchignoni, F. et al. (2013). Minimally clinically important difference of the disabilities of the arm, shoulder, and hand outcome measures (DASH) and its shortened version (Quick DASH). Journal of Orthopaedic & Sports Physical Therapy, 44(1), 30-39)   COGNITION: Overall cognitive status: Within functional limits for tasks assessed     SENSATION: Patient reports intermittent right shoulder numbness and tingling in her right shoulder and hand since surgery.   UPPER EXTREMITY ROM:   Active ROM Right Eval (PROM) Left eval  Shoulder flexion 70 145  Shoulder extension    Shoulder abduction 30 131  Shoulder adduction    Shoulder internal rotation 60 To T6  Shoulder external rotation -10 To T3  Elbow flexion    Elbow extension    Wrist flexion    Wrist extension    Wrist ulnar deviation    Wrist radial deviation    Wrist pronation  Wrist  supination    (Blank rows = not tested)  UPPER EXTREMITY MMT: not tested due to surgical condition  SHOULDER SPECIAL TESTS: Not tested due to surgical condition  JOINT MOBILITY TESTING:  Not tested due to high pain severity and irritability  PALPATION:  Global right shoulder and UE tenderness to palpation                                                                                                                             TREATMENT DATE:                                    03/30/24 EXERCISE LOG  Exercise Repetitions and Resistance Comments  Pulleys  5 minutes  Flexion  UBE  10 minutes @ 90 RPM    Functional IR stretch  4 x 30 seconds   Resisted IR  Green t-band x 2.5 minutes    Posterior lift off  15 reps    Resisted row  Green t-band x 3 minutes   Therabar bending  Red t-bar x 25 reps each     Blank cell = exercise not performed today                                    03/25/24 EXERCISE LOG  Exercise Repetitions and Resistance Comments  Pulleys  5 minutes  Flexion  UBE  10 minutes @ 90 RPM    UE ranger  (seated) 5 minutes  Multidirectional  Resisted row   Green t-band x 2 minutes RUE only   Doorway stretch  4 x 30 seconds    AA cane ABD  2 x 10 reps w/ 3 second hold  131 degrees after this intervention  Wall ladder  10 reps  Max #24  Wall circles  2 x 1 minute At 90 degrees shoulder flexion   Therabar bending  Red t-bar x 30 reps each  Up and down   Resisted IR  Red t-band x 2 x 15 reps     Blank cell = exercise not performed today   03/23/24     EXERCISE LOG  Exercise Repetitions and Resistance Comments  Pulleys  7 minutes  Flexion   UE Ranger (seated) 5 minutes  Multidirectional  UBE  8 minutes @ 120 RPM (forward/backward)   Wall Ladder 5 reps (max# 23)   R shoulder flexion isometric     R shoulder extension isometric     R shoulder ADD isometric     Wall washes 3 minutes  Multidirectional  Seated cane punch out  2# x 20 reps    Seated cane flexion  2# 2 x  10 reps    Flexion 1# 2 x 10 reps   Abduction 1# 2 x 10 reps   Wall push  up  15 reps    Isometric ball squeeze  2 minutes w/ 5 second hold  For shoulder IR    Blank cell = exercise not performed today   PATIENT EDUCATION: Education details: healing and expectation for soreness Person educated: Patient Education method: Explanation Education comprehension: verbalized understanding  HOME EXERCISE PROGRAM: [id number]  ASSESSMENT:  CLINICAL IMPRESSION: Patient was progressed with new and familiar interventions for improved functional internal rotation needed for dressing and bathing. She required minimal cueing with today's new interventions for proper exercise performance. She experienced a mild increase in right shoulder discomfort and fatigue with today's new interventions, but this did not limit her ability to complete these interventions. She reported that her shoulder felt tired upon the conclusion of treatment. She continues to require skilled physical therapy to address her remaining impairments to return to her prior level of function.   OBJECTIVE IMPAIRMENTS: decreased activity tolerance, decreased ROM, decreased strength, hypomobility, impaired sensation, impaired tone, impaired UE functional use, and pain.   ACTIVITY LIMITATIONS: carrying, lifting, sleeping, dressing, reach over head, and hygiene/grooming  PARTICIPATION LIMITATIONS: meal prep, cleaning, laundry, community activity, and occupation  PERSONAL FACTORS: Past/current experiences and 3+ comorbidities: Hypertension, type 1 diabetes, current smoker anxiety, and depression are also affecting patient's functional outcome.   REHAB POTENTIAL: Good  CLINICAL DECISION MAKING: Evolving/moderate complexity  EVALUATION COMPLEXITY: Moderate   GOALS: Goals reviewed with patient? No  SHORT TERM GOALS: Target date: 03/25/24  Patient will be independent with her initial HEP.  Baseline: Goal status: MET  2.  Patient will be  able to demonstrate at least 120 degrees of passive right shoulder flexion for improved shoulder mobility. Baseline: 8/18: 121 degrees Goal status: MET  3.  Patient will report being able to type on her computer without being limited by her familiar right shoulder symptoms for improved function with her daily activities. Baseline:  Goal status: MET  4.  Patient will improve her QuickDASH score to 60 or less for improved perceived function with her daily activities. Baseline: 8/18: 22.7 Goal status: MET  LONG TERM GOALS: Target date: 04/15/24  Patient will be independent with her advanced HEP.  Baseline:  Goal status: IN PROGRESS  2.  Patient will be able to demonstrate at least 120 degrees of active right shoulder flexion for improved function reaching overhead. Baseline: 8/18: 112 degrees 03/25/24: 132 degrees Goal status: MET  3.  Patient will be able to demonstrate at least 120 degrees of active right shoulder abduction for improved function reaching overhead. Baseline: 8/18: 125 degrees with max effort Goal status: IN PROGRESS  4.  Patient will be able to carry at least 8 pounds with her right upper extremity for improved function carrying her groceries. Baseline: 8/18: unable at this time due to protocol Goal status: INITIAL  5.  Patient will be able to complete her critical job demands without being limited by her familiar right shoulder symptoms. Baseline:  Goal status: IN PROGRESS  6.  Patient will improve her QuickDASH score to 40 or less for improved perceived function with her daily activities. Baseline: 8/18: 22.7 Goal status: MET  PLAN:  PT FREQUENCY: 2-3x/week  PT DURATION: other: 7 weeks  PLANNED INTERVENTIONS: 97164- PT Re-evaluation, 97750- Physical Performance Testing, 97110-Therapeutic exercises, 97530- Therapeutic activity, [id number]- Neuromuscular re-education, 97535- Self Care, 02859- Manual therapy, [id number]- Electrical stimulation (unattended), 97016-  Vasopneumatic device, Patient/Family education, Joint mobilization, Cryotherapy, and Moist heat  PLAN FOR NEXT SESSION: manual therapy, isometrics, AAROM, and modalities as needed  Lacinda JAYSON Fass, PT 03/30/2024, 5:18 PM

## 2024-03-31 ENCOUNTER — Ambulatory Visit

## 2024-04-09 ENCOUNTER — Ambulatory Visit: Payer: Self-pay | Attending: Orthopedic Surgery

## 2024-05-22 ENCOUNTER — Other Ambulatory Visit: Payer: Self-pay | Admitting: Internal Medicine

## 2024-05-27 ENCOUNTER — Telehealth: Payer: Self-pay | Admitting: Internal Medicine

## 2024-05-27 NOTE — Telephone Encounter (Signed)
 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  Patient states that she had a D.O.T. physical and they denied her due to her diabetes.  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 Sam, then her situation would be re-evaluated.

## 2024-05-28 NOTE — Telephone Encounter (Signed)
 Patient will get the form for Dr. Sam to fill out

## 2024-05-28 NOTE — Telephone Encounter (Signed)
 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

## 2024-06-17 ENCOUNTER — Other Ambulatory Visit: Payer: Self-pay

## 2024-06-17 DIAGNOSIS — N200 Calculus of kidney: Secondary | ICD-10-CM

## 2024-06-19 ENCOUNTER — Encounter: Payer: Self-pay | Admitting: Urology

## 2024-06-19 ENCOUNTER — Ambulatory Visit (HOSPITAL_COMMUNITY)
Admission: RE | Admit: 2024-06-19 | Discharge: 2024-06-19 | Disposition: A | Discharge status: Home / Self Care | Source: Ambulatory Visit | Attending: Urology | Admitting: Urology

## 2024-06-19 ENCOUNTER — Ambulatory Visit (INDEPENDENT_AMBULATORY_CARE_PROVIDER_SITE_OTHER): Admitting: Urology

## 2024-06-19 VITALS — BP 103/62 | HR 81

## 2024-06-19 DIAGNOSIS — N200 Calculus of kidney: Secondary | ICD-10-CM | POA: Diagnosis present

## 2024-06-19 LAB — MICROSCOPIC EXAMINATION: Epithelial Cells (non renal): >10 /HPF — AB (ref 0–10)

## 2024-06-19 LAB — URINALYSIS, ROUTINE W REFLEX MICROSCOPIC
Bilirubin, UA: NEGATIVE
Glucose, UA: NEGATIVE
Ketones, UA: NEGATIVE
Leukocytes,UA: NEGATIVE
Nitrite, UA: NEGATIVE
Protein,UA: NEGATIVE
Specific Gravity, UA: <1.005 — ABNORMAL LOW (ref 1.005–1.030)
Urobilinogen, Ur: 0.2 mg/dL (ref 0.2–1.0)
pH, UA: 6 (ref 5.0–7.5)

## 2024-06-19 NOTE — Progress Notes (Signed)
 06/19/2024 12:47 PM   Traci Ellison [id number]  Referring provider: Marlee Lynwood NOVAK, MD [phone] [address] [place],  [place] [address]  nephrolithiasis   HPI: Traci Ellison is a 38yo here for evaluation of nephrolithiasis. She developed left flank pain in 03/2024 and presented to [facility] and was diagnosed with a 3mm left distal calculus and bilateral renal calculi. She has had increased urinary frequency, urgency and dysuria for the past week. Her first stone event was age 33. KUB shows left 4mm renal calculus and possible 3mm left distal ureteral calculus. She continues to have intermittent left flank pain   PMH: Past Medical History:  Diagnosis Date   Anxiety    8 months ago   Depression    8 months ago   Diabetes mellitus    type 1--since age 63   Diabetes mellitus type I (HCC)    Headache(784.0)    migraines, last 3 days ago-occurs occ.   History of kidney stones    x 1    Vaginal Pap smear, abnormal     Surgical History: Past Surgical History:  Procedure Laterality Date   CERVICAL BIOPSY  W/ LOOP ELECTRODE EXCISION     childbirth      x2 -last 3 months ago-[facility]   CYST REMOVAL TRUNK Left 04/03/2013   Procedure: EXCISION CHRONIC SEBACEOUS CYST LEFT UPPER CHEST WALL;  Surgeon: Vicenta DELENA Poli, MD;  Location: [facility];  Service: General;  Laterality: Left;   INDUCED ABORTION      Home Medications:  Allergies as of 06/19/2024       Reactions   Diclofenac Hives, Itching   Nsaids    Methocarbamol Dermatitis        Medication List        Accurate as of June 19, 2024 12:47 PM. If you have any questions, ask your nurse or doctor.          Accu-Chek Guide Test test strip Generic drug: glucose blood USE AS INSTRUCTED AT LEAST 7X DAILY   atorvastatin  10 MG tablet Commonly known as: LIPITOR Take 1 tablet (10 mg total) by mouth at bedtime.   BD Pen Needle Nano U/F 32G X 4 MM Misc Generic drug: Insulin  Pen Needle 1 each by Does  not apply route 4 (four) times daily.   busPIRone 5 MG tablet Commonly known as: BUSPAR Take 10 mg by mouth 3 (three) times daily.   Dexcom G6 Transmitter Misc 1 Piece by Does not apply route as directed.   Dexcom G7 Sensor Misc USE 1 SENSOR AS DIRECTED EVERY 10 DAYS   FLUoxetine 20 MG capsule Commonly known as: PROZAC Take 1 tablet by mouth 2 (two) times daily.   FLUoxetine 10 MG capsule Commonly known as: PROZAC Take 20 mg by mouth daily.   gabapentin  300 MG capsule Commonly known as: NEURONTIN  Take 1 capsule (300 mg total) by mouth at bedtime.   Lantus  SoloStar 100 UNIT/ML Solostar Pen Generic drug: insulin  glargine Inject 30 Units into the skin at bedtime.   levonorgestrel 20 MCG/DAY Iud Commonly known as: MIRENA by Intrauterine route.   lisinopril 2.5 MG tablet Commonly known as: ZESTRIL Take 1 tablet by mouth daily.   Microlet Lancets Misc Use to test blood glucose up to 7 times a day.   NovoLOG  FlexPen 100 UNIT/ML FlexPen Generic drug: insulin  aspart Inject 6-9 Units into the skin 3 (three) times daily with meals. Max daily 30 units daily   insulin  aspart 100  UNIT/ML injection Commonly known as: novoLOG  MAX DAILY DOSE 65 UNITS INSULIN  PUMP   Omnipod 5 G7 Intro (Gen 5) Kit 1 Device by Does not apply route every other day.   Omnipod 5 G7 Pods (Gen 5) Misc 1 Device by Does not apply route every 3 (three) days.   Restasis 0.05 % ophthalmic emulsion Generic drug: cycloSPORINE 1 drop 2 (two) times daily.   ZANTAC PO Take by mouth as needed.        Allergies:  Allergies  Allergen Reactions   Diclofenac Hives and Itching   Nsaids    Methocarbamol Dermatitis    Family History: Family History  Problem Relation Age of Onset   Hypertension Mother    Diabetes Mother    Heart disease Mother    Stroke Mother     Social History:  reports that she has been smoking cigarettes. She has a 0.3 pack-year smoking history. She has never used smokeless  tobacco. She reports that she does not drink alcohol and does not use drugs.  ROS: All other review of systems were reviewed and are negative except what is noted above in HPI  Physical Exam: BP 103/62   Pulse 81   Constitutional:  Alert and oriented, No acute distress. HEENT: [place] AT, moist mucus membranes.  Trachea midline, no masses. Cardiovascular: No clubbing, cyanosis, or edema. Respiratory: Normal respiratory effort, no increased work of breathing. GI: Abdomen is soft, nontender, nondistended, no abdominal masses GU: No CVA tenderness.  Lymph: No cervical or inguinal lymphadenopathy. Skin: No rashes, bruises or suspicious lesions. Neurologic: Grossly intact, no focal deficits, moving all 4 extremities. Psychiatric: Normal mood and affect.  Laboratory Data: Lab Results  Component Value Date   WBC 8.2 03/31/2013   HGB 15.5 05/26/2021   HCT 42.0 03/31/2013   MCV 91.3 03/31/2013   PLT 343 03/31/2013    Lab Results  Component Value Date   CREATININE 0.72 05/30/2023    No results found for: PSA  No results found for: TESTOSTERONE  Lab Results  Component Value Date   HGBA1C 6.9 (A) 02/06/2024    Urinalysis    Component Value Date/Time   COLORURINE YELLOW 12/28/2011 1350   APPEARANCEUR CLEAR 12/28/2011 1350   LABSPEC <1.005 (L) 12/28/2011 1350   PHURINE 6.5 12/28/2011 1350   GLUCOSEU >1000 (A) 12/28/2011 1350   HGBUR LARGE (A) 12/28/2011 1350   BILIRUBINUR NEGATIVE 12/28/2011 1350   KETONESUR NEGATIVE 12/28/2011 1350   PROTEINUR NEGATIVE 12/28/2011 1350   UROBILINOGEN 0.2 12/28/2011 1350   NITRITE NEGATIVE 12/28/2011 1350   LEUKOCYTESUR NEGATIVE 12/28/2011 1350    Lab Results  Component Value Date   BACTERIA RARE 12/28/2011    Pertinent Imaging: KUB: images reviewed and discussed with the patient  No results found for this or any previous visit.  No results found for this or any previous visit.  No results found for this or any previous  visit.  No results found for this or any previous visit.  Results for orders placed during the hospital encounter of 10/04/08  US  Renal  Narrative Clinical Data: Right side pain.  RENAL/URINARY TRACT ULTRASOUND  Technique:  Complete ultrasound examination of the urinary tract was performed including evaluation of the kidneys, renal collecting systems, and urinary bladder.  Comparison: Renal ultrasound 08/17/2008.  Findings: The right kidney measures 12.6 cm in the left kidney measures at 12.1 cm.  Previously seen area of architectural distortion of the mid pole right kidney is not visualized on today's examination.  No stone, mass or hydronephrosis is visualized.  Both kidneys appear normal.  Urinary bladder is unremarkable.  IMPRESSION: Negative exam.  Provider: Levander Corners  No results found for this or any previous visit.  No results found for this or any previous visit.  No results found for this or any previous visit.   Assessment & Plan:    1. Kidney stones (Primary) -STAT CT stone study.  - Urinalysis, Routine w reflex microscopic   No follow-ups on file.  Belvie Clara, MD  [facility] [place]

## 2024-06-19 NOTE — Patient Instructions (Signed)

## 2024-06-22 ENCOUNTER — Other Ambulatory Visit: Payer: Self-pay

## 2024-06-22 DIAGNOSIS — N200 Calculus of kidney: Secondary | ICD-10-CM

## 2024-06-23 ENCOUNTER — Ambulatory Visit (HOSPITAL_COMMUNITY)

## 2024-06-25 ENCOUNTER — Ambulatory Visit (HOSPITAL_COMMUNITY)

## 2024-07-01 ENCOUNTER — Ambulatory Visit: Attending: Orthopedic Surgery | Admitting: Physical Therapy

## 2024-07-01 ENCOUNTER — Other Ambulatory Visit: Payer: Self-pay

## 2024-07-01 DIAGNOSIS — G8929 Other chronic pain: Secondary | ICD-10-CM | POA: Insufficient documentation

## 2024-07-01 DIAGNOSIS — M62838 Other muscle spasm: Secondary | ICD-10-CM | POA: Diagnosis present

## 2024-07-01 DIAGNOSIS — M25511 Pain in right shoulder: Secondary | ICD-10-CM | POA: Insufficient documentation

## 2024-07-01 DIAGNOSIS — M25611 Stiffness of right shoulder, not elsewhere classified: Secondary | ICD-10-CM | POA: Insufficient documentation

## 2024-07-01 NOTE — Therapy (Signed)
 OUTPATIENT PHYSICAL THERAPY SHOULDER EVALUATION   Patient Name: Traci Ellison MRN: [id number] DOB:15-Dec-1985, 38 y.o., female Today's Date: 07/01/2024  END OF SESSION:  PT End of Session - 07/01/24 1552     Visit Number 1    Number of Visits 12    Date for Recertification  08/12/24    PT Start Time 0146    PT Stop Time 0212    PT Time Calculation (min) 26 min    Activity Tolerance Patient tolerated treatment well    Behavior During Therapy [facility] for tasks assessed/performed          Past Medical History:  Diagnosis Date   Anxiety    8 months ago   Depression    8 months ago   Diabetes mellitus    type 1--since age 40   Diabetes mellitus type I (HCC)    Headache(784.0)    migraines, last 3 days ago-occurs occ.   History of kidney stones    x 1    Vaginal Pap smear, abnormal    Past Surgical History:  Procedure Laterality Date   CERVICAL BIOPSY  W/ LOOP ELECTRODE EXCISION     childbirth      x2 -last 3 months ago-[facility]   CYST REMOVAL TRUNK Left 04/03/2013   Procedure: EXCISION CHRONIC SEBACEOUS CYST LEFT UPPER CHEST WALL;  Surgeon: Vicenta DELENA Poli, MD;  Location: [facility];  Service: General;  Laterality: Left;   INDUCED ABORTION     Patient Active Problem List   Diagnosis Date Noted   Type 1 diabetes mellitus without complication 02/06/2024   Type 1 diabetes mellitus with hyperglycemia (HCC) 05/30/2023   Poorly controlled type 1 diabetes mellitus (HCC) 07/06/2021   Essential hypertension, benign 07/06/2021   Mixed hyperlipidemia 07/06/2021   Personal history of noncompliance with medical treatment, presenting hazards to health 03/20/2017   Current smoker 09/28/2016   Uncontrolled type 1 diabetes mellitus with complication 09/21/2016   Pre-existing type 1 diabetes mellitus in pregnancy in first trimester 05/13/2012   Sebaceous cyst 04/28/2012   REFERRING PROVIDER: Manus Gobble DO  REFERRING DIAG: Adhesive capsulitis of right  shoulder.  THERAPY DIAG:  Chronic right shoulder pain - Plan: PT plan of care cert/re-cert  Stiffness of right shoulder, not elsewhere classified - Plan: PT plan of care cert/re-cert  Other muscle spasm - Plan: PT plan of care cert/re-cert  Rationale for Evaluation and Treatment: Rehabilitation  ONSET DATE: 02/25/24 (surgery date).    SUBJECTIVE:  SUBJECTIVE STATEMENT: The patient presents to the clinic with c/o right shoulder pain and a loss of motion.  She rates her pain at a 7-8/10 today.  She states nothing decreases her pain and everything increases her pain.    PERTINENT HISTORY: DM, prior shoulder surgery.  PAIN:  Are you having pain? Yes: NPRS scale: 7-8/10. Pain location: Right shoulder. Pain description: Ache, sore, throbbing, shooting.   Aggravating factors: As above. Relieving factors: As above.    PRECAUTIONS: None  RED FLAGS: None   WEIGHT BEARING RESTRICTIONS: No  FALLS:  Has patient fallen in last 6 months? No  LIVING ENVIRONMENT: Lives with: lives with their family Lives in: House/apartment   PLOF: Independent  PATIENT GOALS:Regain motion of right shoulder and have less pain.  NEXT MD VISIT:   OBJECTIVE:   PATIENT SURVEYS:  QUICKDASH:  47 points (81.82%).   UPPER EXTREMITY ROM:   Antigravity right shoulder flexion to 95 degrees and passive to 105 degrees.  ER 10 degrees and behind back motion to right SIJ region.    UPPER EXTREMITY MMT:  4 to 4+/5 for all major muscle groups of right shoulder.  PALPATION:  Tender to palpation with a great deal of tone over the right UT and pain complaints over the acromial ridge region.                                                                                                                             TREATMENT  DATE:    PATIENT EDUCATION: Education details: Provided patient with information on obtaining an home pulley system.   Person educated: Patient Education method: Medical Illustrator Education comprehension: verbalized understanding  HOME EXERCISE PROGRAM:   ASSESSMENT:  CLINICAL IMPRESSION: The patient presents to [facility] with c/o right shoulder pain and stiffness.  She demonstrates a significant loss of motion.  She is tender to palpation with a great deal of tone over the right UT and pain complaints over the acromial ridge region.  She has a minimal loss of strength. Her QUICKDASH score is 81.82.   Patient will benefit from skilled physical therapy intervention to address pain and deficits.   OBJECTIVE IMPAIRMENTS: decreased activity tolerance, decreased ROM, decreased strength, increased muscle spasms, and pain.   ACTIVITY LIMITATIONS: carrying, lifting, dressing, and reach over head  PARTICIPATION LIMITATIONS: meal prep, cleaning, and laundry  PERSONAL FACTORS: Time since onset of injury/illness/exacerbation and 1-2 comorbidities: prior surgery and DM are also affecting patient's functional outcome.   REHAB POTENTIAL: Good  CLINICAL DECISION MAKING: Stable/uncomplicated  EVALUATION COMPLEXITY: Low   GOALS:  LONG TERM GOALS: Target date: 08/12/24  Ind with a HEP.  Goal status: INITIAL  2.   Active right shoulder flexion to 135 degrees so the patient can easily reach overhead.  Goal status: INITIAL  3.  Active ER to 60 degrees+ to allow for easily donning/doffing of apparel.  Goal status: INITIAL  4.   Increase ROM so patient  is able to reach behind back to L3.  Goal status: INITIAL  5.  Perform ADL's with right shoulder pain not > 3/10.  Goal status: INITIAL  6.  Improve QUICKDASH by at least 7 points.  Goal status: INITIAL  PLAN:  PT FREQUENCY: 2x/week  PT DURATION: 6 weeks  PLANNED INTERVENTIONS: 97110-Therapeutic exercises, 97530- Therapeutic  activity, [id number]- Neuromuscular re-education, 97535- Self Care, 02859- Manual therapy, [id number]- Electrical stimulation (unattended), 97016- Vasopneumatic device, 97035- Ultrasound, Patient/Family education, Cryotherapy, and Moist heat  PLAN FOR NEXT SESSION: Pulleys, UE Ranger, PROM.  Modalities and STW/M as needed.     Idania Desouza, PT 07/01/2024, 4:34 PM

## 2024-07-06 ENCOUNTER — Ambulatory Visit (HOSPITAL_COMMUNITY)
Admission: RE | Admit: 2024-07-06 | Discharge: 2024-07-06 | Disposition: A | Source: Ambulatory Visit | Attending: Urology

## 2024-07-06 DIAGNOSIS — N200 Calculus of kidney: Secondary | ICD-10-CM | POA: Diagnosis present

## 2024-07-07 ENCOUNTER — Telehealth: Payer: Self-pay

## 2024-07-14 ENCOUNTER — Ambulatory Visit: Payer: Self-pay

## 2024-07-21 ENCOUNTER — Ambulatory Visit: Attending: Orthopedic Surgery

## 2024-07-22 NOTE — Telephone Encounter (Signed)
 See below

## 2024-07-23 ENCOUNTER — Ambulatory Visit: Attending: Orthopedic Surgery

## 2024-07-23 DIAGNOSIS — M25611 Stiffness of right shoulder, not elsewhere classified: Secondary | ICD-10-CM | POA: Insufficient documentation

## 2024-07-23 DIAGNOSIS — M62838 Other muscle spasm: Secondary | ICD-10-CM | POA: Diagnosis present

## 2024-07-23 DIAGNOSIS — M25511 Pain in right shoulder: Secondary | ICD-10-CM | POA: Diagnosis present

## 2024-07-23 DIAGNOSIS — G8929 Other chronic pain: Secondary | ICD-10-CM | POA: Insufficient documentation

## 2024-07-23 NOTE — Therapy (Signed)
 OUTPATIENT PHYSICAL THERAPY SHOULDER TREATMENT   Patient Name: Traci Ellison MRN: [id number] DOB:02-Dec-1985, 38 y.o., female Today's Date: 07/23/2024  END OF SESSION:  PT End of Session - 07/23/24 1606     Visit Number 2    Number of Visits 12    Date for Recertification  08/12/24    PT Start Time 1600    Activity Tolerance Patient tolerated treatment well    Behavior During Therapy [facility] for tasks assessed/performed          Past Medical History:  Diagnosis Date   Anxiety    8 months ago   Depression    8 months ago   Diabetes mellitus    type 1--since age 81   Diabetes mellitus type I (HCC)    Headache(784.0)    migraines, last 3 days ago-occurs occ.   History of kidney stones    x 1    Vaginal Pap smear, abnormal    Past Surgical History:  Procedure Laterality Date   CERVICAL BIOPSY  W/ LOOP ELECTRODE EXCISION     childbirth      x2 -last 3 months ago-[facility]   CYST REMOVAL TRUNK Left 04/03/2013   Procedure: EXCISION CHRONIC SEBACEOUS CYST LEFT UPPER CHEST WALL;  Surgeon: Vicenta DELENA Poli, MD;  Location: [facility];  Service: General;  Laterality: Left;   INDUCED ABORTION     Patient Active Problem List   Diagnosis Date Noted   Type 1 diabetes mellitus without complication 02/06/2024   Type 1 diabetes mellitus with hyperglycemia (HCC) 05/30/2023   Poorly controlled type 1 diabetes mellitus (HCC) 07/06/2021   Essential hypertension, benign 07/06/2021   Mixed hyperlipidemia 07/06/2021   Personal history of noncompliance with medical treatment, presenting hazards to health 03/20/2017   Current smoker 09/28/2016   Uncontrolled type 1 diabetes mellitus with complication 09/21/2016   Pre-existing type 1 diabetes mellitus in pregnancy in first trimester 05/13/2012   Sebaceous cyst 04/28/2012   REFERRING PROVIDER: Manus Gobble DO  REFERRING DIAG: Adhesive capsulitis of right shoulder.  THERAPY DIAG:  Chronic right shoulder pain  Stiffness of  right shoulder, not elsewhere classified  Other muscle spasm  Acute pain of right shoulder  Rationale for Evaluation and Treatment: Rehabilitation  ONSET DATE: 02/25/24 (surgery date).    SUBJECTIVE:                                                                                                                                                                                      SUBJECTIVE STATEMENT: Pt reports 12/10 right shoulder pain today.   PERTINENT HISTORY: DM, prior shoulder surgery.  PAIN:  Are you having pain?  Yes: NPRS scale: 12/10. Pain location: Right shoulder. Pain description: Ache, sore, throbbing, shooting.   Aggravating factors: As above. Relieving factors: As above.    PRECAUTIONS: None  RED FLAGS: None   WEIGHT BEARING RESTRICTIONS: No  FALLS:  Has patient fallen in last 6 months? No  LIVING ENVIRONMENT: Lives with: lives with their family Lives in: House/apartment   PLOF: Independent  PATIENT GOALS:Regain motion of right shoulder and have less pain.  NEXT MD VISIT:   OBJECTIVE:   PATIENT SURVEYS:  QUICKDASH:  47 points (81.82%).   UPPER EXTREMITY ROM:   Antigravity right shoulder flexion to 95 degrees and passive to 105 degrees.  ER 10 degrees and behind back motion to right SIJ region.    UPPER EXTREMITY MMT:  4 to 4+/5 for all major muscle groups of right shoulder.  PALPATION:  Tender to palpation with a great deal of tone over the right UT and pain complaints over the acromial ridge region.                                                                                                                             TREATMENT DATE:    07/23/24                                  EXERCISE LOG  Exercise Repetitions and Resistance Comments  Pulleys 5 mins   UE Ranger 5 mins   Wall Push Ups 10 reps   Wall Ladder 5 reps (max 20)   Ball on Wall 3 mins    Blank cell = exercise not performed today   Manual Therapy Soft Tissue  Mobilization: Right deltoid and upper trap, STW/M to right deltoid and upper trap to decrease pain and tone   Modalities  Date:  Unattended Estim: Shoulder, IFC 80-150 Hz, 15 mins, Pain and Tone Hot Pack: Shoulder, 15 mins, Pain and Tone   PATIENT EDUCATION: Education details: Provided patient with information on obtaining an home pulley system.   Person educated: Patient Education method: Medical Illustrator Education comprehension: verbalized understanding  HOME EXERCISE PROGRAM:   ASSESSMENT:  CLINICAL IMPRESSION: Pt arrives for today's treatment session reporting 12/10 right shoulder pain.  Pt introduced to active and active assistive exercises today.  Pt requiring min cues for proper technique and pacing.  STW/M performed to right deltoid and upper trap to decrease pain and tone with several trigger points noted.  Normal responses to estim and MH noted upon removal.  Pt reported 10/10 right shoulder pain at completion of today's treatment session.    OBJECTIVE IMPAIRMENTS: decreased activity tolerance, decreased ROM, decreased strength, increased muscle spasms, and pain.   ACTIVITY LIMITATIONS: carrying, lifting, dressing, and reach over head  PARTICIPATION LIMITATIONS: meal prep, cleaning, and laundry  PERSONAL FACTORS: Time since onset of injury/illness/exacerbation and 1-2 comorbidities: prior surgery and DM are also affecting patient's functional outcome.  REHAB POTENTIAL: Good  CLINICAL DECISION MAKING: Stable/uncomplicated  EVALUATION COMPLEXITY: Low   GOALS:  LONG TERM GOALS: Target date: 08/12/24  Ind with a HEP.  Goal status: INITIAL  2.   Active right shoulder flexion to 135 degrees so the patient can easily reach overhead.  Goal status: INITIAL  3.  Active ER to 60 degrees+ to allow for easily donning/doffing of apparel.  Goal status: INITIAL  4.   Increase ROM so patient is able to reach behind back to L3.  Goal status: INITIAL  5.   Perform ADL's with right shoulder pain not > 3/10.  Goal status: INITIAL  6.  Improve QUICKDASH by at least 7 points.  Goal status: INITIAL  PLAN:  PT FREQUENCY: 2x/week  PT DURATION: 6 weeks  PLANNED INTERVENTIONS: 97110-Therapeutic exercises, 97530- Therapeutic activity, [id number]- Neuromuscular re-education, 97535- Self Care, 02859- Manual therapy, G0283- Electrical stimulation (unattended), 97016- Vasopneumatic device, 97035- Ultrasound, Patient/Family education, Cryotherapy, and Moist heat  PLAN FOR NEXT SESSION: Pulleys, UE Ranger, PROM.  Modalities and STW/M as needed.     Delon DELENA Gosling, PTA 07/23/2024, 5:10 PM

## 2024-07-27 ENCOUNTER — Other Ambulatory Visit: Payer: Self-pay | Admitting: Internal Medicine

## 2024-07-27 ENCOUNTER — Ambulatory Visit: Admitting: *Deleted

## 2024-07-27 DIAGNOSIS — E1065 Type 1 diabetes mellitus with hyperglycemia: Secondary | ICD-10-CM

## 2024-08-03 ENCOUNTER — Ambulatory Visit

## 2024-08-03 DIAGNOSIS — M25511 Pain in right shoulder: Secondary | ICD-10-CM | POA: Diagnosis not present

## 2024-08-03 DIAGNOSIS — M25611 Stiffness of right shoulder, not elsewhere classified: Secondary | ICD-10-CM

## 2024-08-03 DIAGNOSIS — G8929 Other chronic pain: Secondary | ICD-10-CM

## 2024-08-03 DIAGNOSIS — M62838 Other muscle spasm: Secondary | ICD-10-CM

## 2024-08-03 NOTE — Therapy (Signed)
 " OUTPATIENT PHYSICAL THERAPY SHOULDER TREATMENT   Patient Name: Traci Ellison MRN: [id number] DOB:20-Nov-1985, 38 y.o., female Today's Date: 08/03/2024  END OF SESSION:  PT End of Session - 08/03/24 1526     Visit Number 3    Number of Visits 12    Date for Recertification  08/12/24    PT Start Time 1515    PT Stop Time 1602    PT Time Calculation (min) 47 min    Activity Tolerance Patient tolerated treatment well    Behavior During Therapy WFL for tasks assessed/performed          Past Medical History:  Diagnosis Date   Anxiety    8 months ago   Depression    8 months ago   Diabetes mellitus    type 1--since age 79   Diabetes mellitus type I (HCC)    Headache(784.0)    migraines, last 3 days ago-occurs occ.   History of kidney stones    x 1    Vaginal Pap smear, abnormal    Past Surgical History:  Procedure Laterality Date   CERVICAL BIOPSY  W/ LOOP ELECTRODE EXCISION     childbirth      x2 -last 3 months ago-[facility]   CYST REMOVAL TRUNK Left 04/03/2013   Procedure: EXCISION CHRONIC SEBACEOUS CYST LEFT UPPER CHEST WALL;  Surgeon: Vicenta DELENA Poli, MD;  Location: [facility];  Service: General;  Laterality: Left;   INDUCED ABORTION     Patient Active Problem List   Diagnosis Date Noted   Type 1 diabetes mellitus without complication 02/06/2024   Type 1 diabetes mellitus with hyperglycemia (HCC) 05/30/2023   Poorly controlled type 1 diabetes mellitus (HCC) 07/06/2021   Essential hypertension, benign 07/06/2021   Mixed hyperlipidemia 07/06/2021   Personal history of noncompliance with medical treatment, presenting hazards to health 03/20/2017   Current smoker 09/28/2016   Uncontrolled type 1 diabetes mellitus with complication 09/21/2016   Pre-existing type 1 diabetes mellitus in pregnancy in first trimester 05/13/2012   Sebaceous cyst 04/28/2012   REFERRING PROVIDER: Manus Gobble DO  REFERRING DIAG: Adhesive capsulitis of right  shoulder.  THERAPY DIAG:  Chronic right shoulder pain  Stiffness of right shoulder, not elsewhere classified  Other muscle spasm  Acute pain of right shoulder  Rationale for Evaluation and Treatment: Rehabilitation  ONSET DATE: 02/25/24 (surgery date).    SUBJECTIVE:                                                                                                                                                                                      SUBJECTIVE STATEMENT: Pt reports 115/10 right shoulder  pain today.  Pt reports that she had a cortisone shot earlier today at [facility] and was told to go ahead and come to therapy.   PERTINENT HISTORY: DM, prior shoulder surgery.  PAIN:  Are you having pain? Yes: NPRS scale: 115/10. Pain location: Right shoulder. Pain description: Ache, sore, throbbing, shooting.   Aggravating factors: As above. Relieving factors: As above.    PRECAUTIONS: None  RED FLAGS: None   WEIGHT BEARING RESTRICTIONS: No  FALLS:  Has patient fallen in last 6 months? No  LIVING ENVIRONMENT: Lives with: lives with their family Lives in: House/apartment   PLOF: Independent  PATIENT GOALS:Regain motion of right shoulder and have less pain.  NEXT MD VISIT:   OBJECTIVE:   PATIENT SURVEYS:  QUICKDASH:  47 points (81.82%).   UPPER EXTREMITY ROM:   Antigravity right shoulder flexion to 95 degrees and passive to 105 degrees.  ER 10 degrees and behind back motion to right SIJ region.    UPPER EXTREMITY MMT:  4 to 4+/5 for all major muscle groups of right shoulder.  PALPATION:  Tender to palpation with a great deal of tone over the right UT and pain complaints over the acromial ridge region.                                                                                                                             TREATMENT DATE:    07/23/24                                  EXERCISE LOG  Exercise Repetitions and Resistance Comments  Pulleys 6  mins   UE Ranger 6 mins   Wall Push Ups    Wall Ladder    Ball on Wall     Blank cell = exercise not performed today     Modalities  Date:  Unattended Estim: Shoulder, IFC 80-150 Hz, 20 mins, Pain and Tone Vaso: Shoulder, 34 degrees; low pressure, 20 mins, Pain and Edema   PATIENT EDUCATION: Education details: Provided patient with information on obtaining an home pulley system.   Person educated: Patient Education method: Medical Illustrator Education comprehension: verbalized understanding  HOME EXERCISE PROGRAM:   ASSESSMENT:  CLINICAL IMPRESSION: Pt arrives for today's treatment session reporting 115/10 right shoulder pain.  PT had injection earlier today at MD appt and was advised to attending today's therapy session.  Today's treatment session limited to [facility] due to high pain levels.  Normal responses to vaso and estim noted upon removal.  Pt reported 10/10 right shoulder pain at completion of today's treatment session.   OBJECTIVE IMPAIRMENTS: decreased activity tolerance, decreased ROM, decreased strength, increased muscle spasms, and pain.   ACTIVITY LIMITATIONS: carrying, lifting, dressing, and reach over head  PARTICIPATION LIMITATIONS: meal prep, cleaning, and laundry  PERSONAL FACTORS: Time since onset of injury/illness/exacerbation and 1-2 comorbidities: prior surgery and DM are  also affecting patient's functional outcome.   REHAB POTENTIAL: Good  CLINICAL DECISION MAKING: Stable/uncomplicated  EVALUATION COMPLEXITY: Low   GOALS:  LONG TERM GOALS: Target date: 08/12/24  Ind with a HEP.  Goal status: INITIAL  2.   Active right shoulder flexion to 135 degrees so the patient can easily reach overhead.  Goal status: INITIAL  3.  Active ER to 60 degrees+ to allow for easily donning/doffing of apparel.  Goal status: INITIAL  4.   Increase ROM so patient is able to reach behind back to L3.  Goal status: INITIAL  5.  Perform ADL's with  right shoulder pain not > 3/10.  Goal status: INITIAL  6.  Improve QUICKDASH by at least 7 points.  Goal status: INITIAL  PLAN:  PT FREQUENCY: 2x/week  PT DURATION: 6 weeks  PLANNED INTERVENTIONS: 97110-Therapeutic exercises, 97530- Therapeutic activity, [id number]- Neuromuscular re-education, 97535- Self Care, 02859- Manual therapy, [id number]- Electrical stimulation (unattended), 97016- Vasopneumatic device, 97035- Ultrasound, Patient/Family education, Cryotherapy, and Moist heat  PLAN FOR NEXT SESSION: Pulleys, UE Ranger, PROM.  Modalities and STW/M as needed.     Delon DELENA Gosling, PTA 08/03/2024, 4:03 PM  "

## 2024-08-05 ENCOUNTER — Ambulatory Visit

## 2024-08-17 ENCOUNTER — Other Ambulatory Visit

## 2024-08-17 ENCOUNTER — Ambulatory Visit: Admitting: Internal Medicine

## 2024-08-17 ENCOUNTER — Encounter: Payer: Self-pay | Admitting: Internal Medicine

## 2024-08-17 VITALS — BP 120/70 | Ht 60.75 in | Wt 115.0 lb

## 2024-08-17 DIAGNOSIS — E782 Mixed hyperlipidemia: Secondary | ICD-10-CM

## 2024-08-17 DIAGNOSIS — E1065 Type 1 diabetes mellitus with hyperglycemia: Secondary | ICD-10-CM | POA: Diagnosis not present

## 2024-08-17 LAB — POCT GLYCOSYLATED HEMOGLOBIN (HGB A1C): Hemoglobin A1C: 7.7 % — AB (ref 4.0–5.6)

## 2024-08-17 NOTE — Patient Instructions (Signed)
 Enter 4 g with meals, 2 g with snacks    HOW TO TREAT LOW BLOOD SUGARS (Blood sugar LESS THAN 70 MG/DL) Please follow the RULE OF 15 for the treatment of hypoglycemia treatment (when your (blood sugars are less than 70 mg/dL)   STEP 1: Take 15 grams of carbohydrates when your blood sugar is low, which includes:  3-4 GLUCOSE TABS  OR 3-4 OZ OF JUICE OR REGULAR SODA OR ONE TUBE OF GLUCOSE GEL    STEP 2: RECHECK blood sugar in 15 MINUTES STEP 3: If your blood sugar is still low at the 15 minute recheck --> then, go back to STEP 1 and treat AGAIN with another 15 grams of carbohydrates.

## 2024-08-17 NOTE — Progress Notes (Unsigned)
 " Name: Traci Ellison  MRN/ DOB: [id number], May 18, 1986   Age/ Sex: 39 y.o., female    PCP: Marlee Lynwood NOVAK, MD   Reason for Endocrinology Evaluation: Type 1 Diabetes Mellitus     Date of Initial Endocrinology Visit: 05/30/2023    PATIENT IDENTIFIER: Traci Ellison is a 39 y.o. female with a past medical history of DM. The patient presented for initial endocrinology clinic visit on 05/30/2023 for consultative assistance with her diabetes management.    HPI: Traci Ellison was    Diagnosed with DM at age 74 Prior Medications tried/Intolerance: Used to be on the Medtronic pump as a child            Hemoglobin A1c has ranged from 9.8% in 2023, peaking at 11.2% in 2022.  On her initial visit to our clinic she had an A1c of 9.6%, I adjusted MDI regimen and provided her with a correction scale.  I also referred her to our CDE for carb counting and a prescription of Dexcom was sent to the pharmacy    She was started on the OmniPod 07/2023  SUBJECTIVE:   During the last visit (02/06/2024): A1c 6.9%     Today (08/17/2024): Traci Ellison is here for follow-up on diabetes management.  She checks blood sugars multiple times daily. The patient has  had hypoglycemic episodes since the last clinic visit. The patient is  symptomatic with these episodes.    Continues to right shoulder pain , under going PT. Sx didn;t help which was in 02/2024 Has current nausea  Has constipation - she is on Metamucil      This patient with type 1 diabetes is treated with OmniPod (insulin  pump). During the visit the pump basal and bolus doses were reviewed including carb/insulin  rations and supplemental doses. The clinical list was updated. The glucose meter download was reviewed in detail to determine if the current pump settings are providing the best glycemic control without excessive hypoglycemia.  Pump and meter download:      Pump   OmniPod Settings   Insulin  type   NovoLog    Basal  rate       0000 1.55 u/h           I:C ratio       0000 1:1    Enter #4 g with meals/2g with snack          Sensitivity       0000  40      Goal       0000  120             Type & Model of Pump: OmniPod Insulin  Type: Currently using NovoLog .  Body mass index is 21.91 kg/m.  PUMP STATISTICS: Average BG: 121 Average Daily Carbs (g): 0  Average Total Daily Insulin : 10.6 Average Daily Basal: 10.2 (96 %) Average Daily Bolus: 0.4 (4 %)      HOME DIABETES REGIMEN: NovoLog   Statin: yes ACE-I/ARB: no   CONTINUOUS GLUCOSE MONITORING RECORD INTERPRETATION    Dates of Recording: 2/15 - 10/04/2023  Sensor description: Dexcom  Results statistics:   CGM use % of time 67.2  Average and SD 121/51  Time in range      69%  % Time Above 180 15  % Time above 250 1  % Time Below target 6   Glycemic patterns summary: BGs are mostly optimal throughout the day and night  Hyperglycemic episodes postprandial  Hypoglycemic episodes occurred through  the day and night  Overnight periods: Variable but mostly low    DIABETIC COMPLICATIONS: Microvascular complications:   Denies: DR, retinopathy, neuropathy  Last eye exam: Completed 07/2022  Macrovascular complications:   Denies: CAD, PVD, CVA   PAST HISTORY: Past Medical History:  Past Medical History:  Diagnosis Date   Anxiety    8 months ago   Depression    8 months ago   Diabetes mellitus    type 1--since age 60   Diabetes mellitus type I (HCC)    Headache(784.0)    migraines, last 3 days ago-occurs occ.   History of kidney stones    x 1    Vaginal Pap smear, abnormal    Past Surgical History:  Past Surgical History:  Procedure Laterality Date   CERVICAL BIOPSY  W/ LOOP ELECTRODE EXCISION     childbirth      x2 -last 3 months ago-[facility]   CYST REMOVAL TRUNK Left 04/03/2013   Procedure: EXCISION CHRONIC SEBACEOUS CYST LEFT UPPER CHEST WALL;  Surgeon: Vicenta DELENA Poli, MD;  Location:  [facility];  Service: General;  Laterality: Left;   INDUCED ABORTION      Social History:  reports that she has been smoking cigarettes. She has a 0.3 pack-year smoking history. She has never used smokeless tobacco. She reports that she does not drink alcohol and does not use drugs. Family History:  Family History  Problem Relation Age of Onset   Hypertension Mother    Diabetes Mother    Heart disease Mother    Stroke Mother      HOME MEDICATIONS: Allergies as of 08/17/2024       Reactions   Diclofenac Hives, Itching   Nsaids    Methocarbamol Dermatitis        Medication List        Accurate as of August 17, 2024  2:49 PM. If you have any questions, ask your nurse or doctor.          Accu-Chek Guide Test test strip Generic drug: glucose blood USE AS INSTRUCTED AT LEAST 7X DAILY   atorvastatin  10 MG tablet Commonly known as: LIPITOR Take 1 tablet (10 mg total) by mouth at bedtime.   BD Pen Needle Nano U/F 32G X 4 MM Misc Generic drug: Insulin  Pen Needle 1 each by Does not apply route 4 (four) times daily.   busPIRone 5 MG tablet Commonly known as: BUSPAR Take 10 mg by mouth 3 (three) times daily.   Dexcom G6 Transmitter Misc 1 Piece by Does not apply route as directed.   Dexcom G7 Sensor Misc USE 1 SENSOR AS DIRECTED EVERY 10 DAYS   FLUoxetine 20 MG capsule Commonly known as: PROZAC Take 1 tablet by mouth 2 (two) times daily.   FLUoxetine 10 MG capsule Commonly known as: PROZAC Take 20 mg by mouth daily.   gabapentin  300 MG capsule Commonly known as: NEURONTIN  Take 1 capsule (300 mg total) by mouth at bedtime.   Lantus  SoloStar 100 UNIT/ML Solostar Pen Generic drug: insulin  glargine Inject 30 Units into the skin at bedtime.   levonorgestrel 20 MCG/DAY Iud Commonly known as: MIRENA by Intrauterine route.   lisinopril 2.5 MG tablet Commonly known as: ZESTRIL Take 1 tablet by mouth daily.   Microlet Lancets Misc Use to test blood glucose up to  7 times a day.   NovoLOG  FlexPen 100 UNIT/ML FlexPen Generic drug: insulin  aspart Inject 6-9 Units into the skin 3 (three) times daily with  meals. Max daily 30 units daily   insulin  aspart 100 UNIT/ML injection Commonly known as: novoLOG  MAX DAILY DOSE 65 UNITS INSULIN  PUMP   Omnipod 5 G7 Intro (Gen 5) Kit 1 Device by Does not apply route every other day.   Omnipod 5 DexG7G6 Pods Gen 5 Misc 1 DEVICE BY DOES NOT APPLY ROUTE EVERY 3 (THREE) DAYS.   Restasis 0.05 % ophthalmic emulsion Generic drug: cycloSPORINE 1 drop 2 (two) times daily.   ZANTAC PO Take by mouth as needed.         ALLERGIES: Allergies  Allergen Reactions   Diclofenac Hives and Itching   Nsaids    Methocarbamol Dermatitis     REVIEW OF SYSTEMS: A comprehensive ROS was conducted with the patient and is negative except as per HPI and    OBJECTIVE:   VITAL SIGNS: BP 120/70   Ht 5' 0.75 (1.543 m)   Wt 115 lb (52.2 kg)   BMI 21.91 kg/m    PHYSICAL EXAM:  General: Pt appears well and is in NAD  Neck: General: Supple without adenopathy or carotid bruits. Thyroid : Thyroid  size normal.  No goiter or nodules appreciated.   Lungs: Clear with good BS bilat   Heart: RRR   Abdomen:  soft, nontender  Extremities:  Lower extremities - No pretibial edema.   Neuro: MS is good with appropriate affect, pt is alert and Ox3    DM foot exam: 08/17/2024  The skin of the feet is intact without sores or ulcerations. The pedal pulses are 2+ on right and 2+ on left. The sensation is intact to a screening 5.07, 10 gram monofilament bilaterally   DATA REVIEWED:  Lab Results  Component Value Date   HGBA1C 6.9 (A) 02/06/2024   HGBA1C 7.2 (A) 10/04/2023   HGBA1C 9.6 (A) 05/30/2023   Labs @ [facility]  12/17/2023   A1c 7.9% Triglycerides 30 LDL 88 HDL 61 Glucose 304 BUN 16 Creatinine 0.84 GFR > 90     ASSESSMENT / PLAN / RECOMMENDATIONS:   1) Type 1 Diabetes Mellitus, Optimally  controlled,  Without  complications - Most recent A1c of 7.7 %. Goal A1c < 7.0 %.     -A1c is optimal -During switching OmniPod from PDM to the phone somehow her settings have changed and she is on a lot more basal insulin  and the sensitivity factor has changed.  The patient has been noted with hypoglycemia during the day, will decrease basal rate -She continues not to bolus on a regular basis, we again discussed the importance of bolusing between 2-4 g before she eats -Patient advised to avoid sugar sweetened beverages   MEDICATIONS: NovoLog      Pump   OmniPod Settings   Insulin  type   NovoLog    Basal rate       0000 1.55 u/h           I:C ratio       0000 1:1    Enter #4 g with meals/2g with snack          Sensitivity       0000  40      Goal       0000  120         EDUCATION / INSTRUCTIONS: BG monitoring instructions: Patient is instructed to check her blood sugars 3 times a day. Call [facility] if: BG persistently < 70  I reviewed the Rule of 15 for the treatment of hypoglycemia in detail with the  patient. Literature supplied.   2) Diabetic complications:  Eye: Does not have known diabetic retinopathy.  Neuro/ Feet: Does not have known diabetic peripheral neuropathy. Renal: Patient does not have known baseline CKD. She is not on an ACEI/ARB at present.  3) Dyslipidemia :  -Lipid panel acceptable 12/2023  Medication Continue atorvastatin  10 mg daily     Follow-up in 6 months  Signed electronically by: Stefano Redgie Butts, MD  [facility] Endocrinology  [facility] [address] [place] [place], [place] [address] Phone: [phone] FAX: [phone]   CC: Marlee Lynwood NOVAK, MD [address] [place] [address] Phone: [phone]  Fax: [phone]    Return to Endocrinology clinic as below: Future Appointments  Date Time Provider Department Center  08/17/2024  3:00 PM Euretha Najarro, Donell Redgie, MD [facility] None   01/20/2025 11:00 AM McKenzie, Belvie CROME, MD [facility] None      "

## 2024-08-18 ENCOUNTER — Ambulatory Visit: Payer: Self-pay | Admitting: Internal Medicine

## 2024-08-18 LAB — BASIC METABOLIC PANEL WITH GFR
BUN: 14 mg/dL (ref 7–25)
CO2: 28 mmol/L (ref 20–32)
Calcium: 9.5 mg/dL (ref 8.6–10.2)
Chloride: 106 mmol/L (ref 98–110)
Creat: 0.68 mg/dL (ref 0.50–0.97)
Glucose, Bld: 88 mg/dL (ref 65–99)
Potassium: 3.9 mmol/L (ref 3.5–5.3)
Sodium: 142 mmol/L (ref 135–146)
eGFR: 114 mL/min/1.73m2

## 2024-08-18 LAB — LIPID PANEL
Cholesterol: 152 mg/dL
HDL: 47 mg/dL — ABNORMAL LOW
LDL Cholesterol (Calc): 85 mg/dL
Non-HDL Cholesterol (Calc): 105 mg/dL
Total CHOL/HDL Ratio: 3.2 (calc)
Triglycerides: 106 mg/dL

## 2024-08-18 LAB — TSH: TSH: 1.44 m[IU]/L

## 2024-08-18 LAB — MICROALBUMIN / CREATININE URINE RATIO
Creatinine, Urine: 32 mg/dL (ref 20–275)
Microalb Creat Ratio: 22 mg/g{creat}
Microalb, Ur: 0.7 mg/dL

## 2024-08-18 MED ORDER — INSULIN ASPART 100 UNIT/ML IJ SOLN: INTRAMUSCULAR | 3 refills | Status: AC

## 2024-08-18 MED ORDER — ATORVASTATIN CALCIUM 10 MG PO TABS: 10.0000 mg | ORAL_TABLET | Freq: Every day | ORAL | 3 refills | Status: AC

## 2024-08-18 MED ORDER — OMNIPOD 5 DEXG7G6 PODS GEN 5 MISC: 1.0000 | 3 refills | Status: AC

## 2024-09-11 ENCOUNTER — Encounter: Payer: Self-pay | Admitting: Internal Medicine

## 2024-09-11 MED ORDER — DEXCOM G7 SENSOR MISC: 3 refills | Status: AC

## 2024-12-04 ENCOUNTER — Ambulatory Visit: Admitting: Internal Medicine

## 2025-01-20 ENCOUNTER — Ambulatory Visit: Admitting: Urology
